# Patient Record
Sex: Female | Born: 1945 | Race: White | Hispanic: No | Marital: Married | Smoking: Former smoker
Health system: Southern US, Community
[De-identification: ages and names within clinical notes are randomized; demographics above are authoritative.]

## PROBLEM LIST (undated history)

## (undated) DIAGNOSIS — T7840XA Allergy, unspecified, initial encounter: Secondary | ICD-10-CM

## (undated) DIAGNOSIS — F32A Depression, unspecified: Secondary | ICD-10-CM

## (undated) DIAGNOSIS — H269 Unspecified cataract: Secondary | ICD-10-CM

## (undated) DIAGNOSIS — G4700 Insomnia, unspecified: Secondary | ICD-10-CM

## (undated) DIAGNOSIS — F419 Anxiety disorder, unspecified: Secondary | ICD-10-CM

## (undated) DIAGNOSIS — D649 Anemia, unspecified: Secondary | ICD-10-CM

## (undated) DIAGNOSIS — F319 Bipolar disorder, unspecified: Secondary | ICD-10-CM

## (undated) DIAGNOSIS — E785 Hyperlipidemia, unspecified: Secondary | ICD-10-CM

## (undated) DIAGNOSIS — M1990 Unspecified osteoarthritis, unspecified site: Secondary | ICD-10-CM

## (undated) DIAGNOSIS — C189 Malignant neoplasm of colon, unspecified: Secondary | ICD-10-CM

## (undated) DIAGNOSIS — I10 Essential (primary) hypertension: Secondary | ICD-10-CM

## (undated) HISTORY — PX: COLONOSCOPY: SHX174

## (undated) HISTORY — DX: Hyperlipidemia, unspecified: E78.5

## (undated) HISTORY — DX: Unspecified cataract: H26.9

## (undated) HISTORY — PX: CATARACT EXTRACTION W/ INTRAOCULAR LENS IMPLANT: SHX1309

## (undated) HISTORY — DX: Anxiety disorder, unspecified: F41.9

## (undated) HISTORY — DX: Anemia, unspecified: D64.9

## (undated) HISTORY — PX: REFRACTIVE SURGERY: SHX103

## (undated) HISTORY — PX: DILATION AND CURETTAGE OF UTERUS: SHX78

## (undated) HISTORY — DX: Unspecified osteoarthritis, unspecified site: M19.90

## (undated) HISTORY — PX: TONSILLECTOMY: SUR1361

---

## 1983-03-22 HISTORY — PX: ABDOMINAL HYSTERECTOMY: SHX81

## 1997-03-21 HISTORY — PX: COSMETIC SURGERY: SHX468

## 2000-04-24 ENCOUNTER — Other Ambulatory Visit: Admission: RE | Admit: 2000-04-24 | Discharge: 2000-04-24 | Payer: Self-pay | Admitting: *Deleted

## 2011-05-11 DIAGNOSIS — J019 Acute sinusitis, unspecified: Secondary | ICD-10-CM | POA: Diagnosis not present

## 2011-05-11 DIAGNOSIS — R030 Elevated blood-pressure reading, without diagnosis of hypertension: Secondary | ICD-10-CM | POA: Diagnosis not present

## 2011-05-11 DIAGNOSIS — J209 Acute bronchitis, unspecified: Secondary | ICD-10-CM | POA: Diagnosis not present

## 2011-11-15 DIAGNOSIS — J209 Acute bronchitis, unspecified: Secondary | ICD-10-CM | POA: Diagnosis not present

## 2011-11-15 DIAGNOSIS — H6690 Otitis media, unspecified, unspecified ear: Secondary | ICD-10-CM | POA: Diagnosis not present

## 2012-09-03 ENCOUNTER — Ambulatory Visit (INDEPENDENT_AMBULATORY_CARE_PROVIDER_SITE_OTHER): Payer: Medicare Other | Admitting: Family Medicine

## 2012-09-03 VITALS — BP 138/78 | HR 75 | Temp 97.9°F | Resp 18 | Ht 65.5 in | Wt 119.6 lb

## 2012-09-03 DIAGNOSIS — F411 Generalized anxiety disorder: Secondary | ICD-10-CM

## 2012-09-03 DIAGNOSIS — F32A Depression, unspecified: Secondary | ICD-10-CM

## 2012-09-03 DIAGNOSIS — F329 Major depressive disorder, single episode, unspecified: Secondary | ICD-10-CM

## 2012-09-03 DIAGNOSIS — R300 Dysuria: Secondary | ICD-10-CM

## 2012-09-03 DIAGNOSIS — F3289 Other specified depressive episodes: Secondary | ICD-10-CM | POA: Diagnosis not present

## 2012-09-03 LAB — POCT UA - MICROSCOPIC ONLY
Bacteria, U Microscopic: NEGATIVE
Casts, Ur, LPF, POC: NEGATIVE
Crystals, Ur, HPF, POC: NEGATIVE
Epithelial cells, urine per micros: NEGATIVE
Mucus, UA: NEGATIVE
Yeast, UA: NEGATIVE

## 2012-09-03 LAB — POCT CBC
Granulocyte percent: 66.4 %G (ref 37–80)
HCT, POC: 48.2 % — AB (ref 37.7–47.9)
Hemoglobin: 15.4 g/dL (ref 12.2–16.2)
Lymph, poc: 1.4 (ref 0.6–3.4)
MCH, POC: 30.8 pg (ref 27–31.2)
MCHC: 32 g/dL (ref 31.8–35.4)
MCV: 96.4 fL (ref 80–97)
MID (cbc): 0.3 (ref 0–0.9)
MPV: 8.4 fL (ref 0–99.8)
POC Granulocyte: 3.4 (ref 2–6.9)
POC LYMPH PERCENT: 26.9 % (ref 10–50)
POC MID %: 6.7 %M (ref 0–12)
Platelet Count, POC: 259 10*3/uL (ref 142–424)
RBC: 5 M/uL (ref 4.04–5.48)
RDW, POC: 13.7 %
WBC: 5.1 10*3/uL (ref 4.6–10.2)

## 2012-09-03 LAB — POCT URINALYSIS DIPSTICK
Bilirubin, UA: NEGATIVE
Blood, UA: NEGATIVE
Glucose, UA: NEGATIVE
Ketones, UA: 80
Leukocytes, UA: NEGATIVE
Nitrite, UA: NEGATIVE
Protein, UA: NEGATIVE
Spec Grav, UA: 1.015
Urobilinogen, UA: 0.2
pH, UA: 5.5

## 2012-09-03 LAB — COMPREHENSIVE METABOLIC PANEL WITH GFR
Albumin: 4.7 g/dL (ref 3.5–5.2)
Alkaline Phosphatase: 50 U/L (ref 39–117)
BUN: 14 mg/dL (ref 6–23)
CO2: 30 meq/L (ref 19–32)
Glucose, Bld: 92 mg/dL (ref 70–99)
Potassium: 4.5 meq/L (ref 3.5–5.3)
Total Bilirubin: 0.9 mg/dL (ref 0.3–1.2)

## 2012-09-03 LAB — POCT WET PREP WITH KOH
Clue Cells Wet Prep HPF POC: NEGATIVE
KOH Prep POC: NEGATIVE
Trichomonas, UA: NEGATIVE
Yeast Wet Prep HPF POC: NEGATIVE

## 2012-09-03 LAB — COMPREHENSIVE METABOLIC PANEL
ALT: 15 U/L (ref 0–35)
AST: 21 U/L (ref 0–37)
Calcium: 9.9 mg/dL (ref 8.4–10.5)
Chloride: 102 mEq/L (ref 96–112)
Creat: 0.68 mg/dL (ref 0.50–1.10)
Sodium: 141 mEq/L (ref 135–145)
Total Protein: 6.9 g/dL (ref 6.0–8.3)

## 2012-09-03 LAB — TSH: TSH: 0.521 u[IU]/mL (ref 0.350–4.500)

## 2012-09-03 MED ORDER — CLONAZEPAM 0.5 MG PO TABS: 0.5000 mg | ORAL_TABLET | Freq: Two times a day (BID) | ORAL | Status: DC | PRN

## 2012-09-03 MED ORDER — SERTRALINE HCL 50 MG PO TABS
50.0000 mg | ORAL_TABLET | Freq: Every day | ORAL | Status: DC
Start: 2012-09-03 — End: 2012-09-27

## 2012-09-03 MED ORDER — CLONAZEPAM 0.25 MG PO TBDP: 0.2500 mg | ORAL_TABLET | Freq: Two times a day (BID) | ORAL | Status: DC | PRN

## 2012-09-03 NOTE — Progress Notes (Signed)
[facility]:  Office Visit  Chief Complaint:  Chief Complaint  Patient presents with  . Depression    on and off for several years   . Insomnia    HPI: Vickie Holloway is a 67 y.o. female who complains of depression. Is being sen by therapist wh she has been to for 3-4 sessions.Feels  Helpless, hopeless, feels horrible. Poor appetiti, not motivated to do anything because of anxiety. Hard to do anything. She has had depression off and on most of her life. Was on medicine 25 years ago but did not take it very long. Does not remember name. She is not one to take meds. She is afraid of death, she has a son who is a quadriplegic, he lives on his own. She is married and has a suppottive husband. No guns in the house She is not suicidal/homicidal.She has generalized anxiety about everything, all the mistakes she's made or about to make, about her life . She is afraid of doctors, she is afriad of finding out if she has medical problems. She flushes all over her skin and gets rashes on her chest when she is anxious.  She sees her therapist Lynnette Caffey EdD in [place] 3 x, she used to [employer] but his wait time was inJuly Mother had depression, mom was ? On Prozac Occassional alcohol drinker, 2 glasses per week She takes her husband's ambien to help her sleep She has had [place] incontinence with dysuria, she put some vasoline on her vaginal area, urinary frequency, no itching, no dc, no fevers, chills, n/v/abd pan/pelvic pain. She has not had sex in over 1 year due to vaginal dryness.    Past Medical History  Diagnosis Date  . Arthritis   . Anxiety    Past Surgical History  Procedure Laterality Date  . Cosmetic surgery     History   Social History  . Marital Status: Married    Spouse Name: N/A    Number of Children: N/A  . Years of Education: N/A   Social History Main Topics  . Smoking status: Never Smoker   . Smokeless tobacco: None  . Alcohol Use: Yes   . Drug Use: No  . Sexually Active: None   Other Topics Concern  . None   Social History Narrative  . None   Family History  Problem Relation Age of Onset  . Stroke Mother    No Known Allergies Prior to Admission medications   Not on File     ROS: The patient denies fevers, chills, night sweats, unintentional weight loss, chest pain, palpitations, wheezing, dyspnea on exertion, nausea, vomiting, abdominal pain, dysuria, hematuria, melena, numbness, weakness, or tingling.  All other systems have been reviewed and were otherwise negative with the exception of those mentioned in the HPI and as above.    PHYSICAL EXAM: Filed Vitals:   09/03/12 1440  BP: 138/78  Pulse: 75  Temp: 97.9 F (36.6 C)  Resp: 18   Filed Vitals:   09/03/12 1440  Height: 5' 5.5" (1.664 m)  Weight: 119 lb 9.6 oz (54.25 kg)   Body mass index is 19.59 kg/(m^2).  General: Alert, tearful, moderate distress and anxiety HEENT:  Normocephalic, atraumatic, oropharynx patent. No thyroidmegaly Cardiovascular:  Regular rate and rhythm, no rubs murmurs or gallops.  No Carotid bruits, radial pulse intact. No pedal edema.  Respiratory: Clear to auscultation bilaterally.  No wheezes, rales, or rhonchi.  No cyanosis, no use of accessory  musculature GI: No organomegaly, abdomen is soft and non-tender, positive bowel sounds.  No masses. Skin: No rashes. Neurologic: Facial musculature symmetric. Psychiatric: Patient is appropriate throughout our interaction considering her anxiety and depression, Denies SI/HI/hallucinations or mania. Lymphatic: No cervical lymphadenopathy Musculoskeletal: Gait intact.   LABS: Results for orders placed in visit on 09/03/12  POCT CBC      Result Value Range   WBC 5.1  4.6 - 10.2 K/uL   Lymph, poc 1.4  0.6 - 3.4   POC LYMPH PERCENT 26.9  10 - 50 %L   MID (cbc) 0.3  0 - 0.9   POC MID % 6.7  0 - 12 %M   POC Granulocyte 3.4  2 - 6.9   Granulocyte percent 66.4  37 - 80 %G   RBC  5.00  4.04 - 5.48 M/uL   Hemoglobin 15.4  12.2 - 16.2 g/dL   HCT, POC 78.2 (*) 95.6 - 47.9 %   MCV 96.4  80 - 97 fL   MCH, POC 30.8  27 - 31.2 pg   MCHC 32.0  31.8 - 35.4 g/dL   RDW, POC 21.3     Platelet Count, POC 259  142 - 424 K/uL   MPV 8.4  0 - 99.8 fL  POCT UA - MICROSCOPIC ONLY      Result Value Range   WBC, Ur, HPF, POC 2-6     RBC, urine, microscopic 4-5     Bacteria, U Microscopic neg     Mucus, UA neg     Epithelial cells, urine per micros neg     Crystals, Ur, HPF, POC neg     Casts, Ur, LPF, POC neg     Yeast, UA neg    POCT URINALYSIS DIPSTICK      Result Value Range   Color, UA yellow     Clarity, UA clear     Glucose, UA neg     Bilirubin, UA neg     Ketones, UA 80     Spec Grav, UA 1.015     Blood, UA neg     pH, UA 5.5     Protein, UA neg     Urobilinogen, UA 0.2     Nitrite, UA neg     Leukocytes, UA Negative    POCT WET PREP WITH KOH      Result Value Range   Trichomonas, UA Negative     Clue Cells Wet Prep HPF POC neg     Epithelial Wet Prep HPF POC 1-2     Yeast Wet Prep HPF POC neg     Bacteria Wet Prep HPF POC 1+     RBC Wet Prep HPF POC 1-4     WBC Wet Prep HPF POC 0-3     KOH Prep POC Negative       EKG/XRAY:   Primary read interpreted by Dr. Conley Rolls at [facility].   ASSESSMENT/PLAN: Encounter Diagnoses  Name Primary?  . Generalized anxiety disorder Yes  . Depression   . Dysuria    She does not have mania sxs. She has significant anxiety and depressionw hich she has been abttling on her own and now wants help. She sees a tehrapist, Lynnette Caffey, she has an appt with psychiatry on Wedensday at [facility] but needs something to tie her over until then since her sxs are getting worse per paitient and therapist.  She adamently denies to me SI/HI/hallucinations Rx Zoloft 50 mg daily Rx  Klonopin 0.25 mg BID prn F/u in 1 week if she misses her psych appt or prn I will f/u by phone tomorrow  Take Azo otc for bladder frequency/spasms for now. Go  to Er prn for worsening sxs or suicidal ideation/thoughts   LE, THAO PHUONG, DO 09/03/2012 5:27 PM

## 2012-09-04 ENCOUNTER — Telehealth: Payer: Self-pay

## 2012-09-04 ENCOUNTER — Telehealth: Payer: Self-pay | Admitting: Family Medicine

## 2012-09-04 NOTE — Telephone Encounter (Signed)
Pt returned dr. Conley Rolls phone call and would like to be called at [phone]

## 2012-09-04 NOTE — Telephone Encounter (Signed)
Spoke to patient re labs. She is doing ok She is less tense, her vaginal issues have resolved. She slept ok from 3-5  But did not let the tab disintergrate, she just swallowed it.

## 2012-09-04 NOTE — Telephone Encounter (Signed)
LM on machine both mobile and home phone to see how she is doing.

## 2012-09-05 ENCOUNTER — Ambulatory Visit (HOSPITAL_COMMUNITY): Admission: RE | Admit: 2012-09-05 | Payer: Medicare Other | Source: Home / Self Care | Admitting: Psychiatry

## 2012-09-06 ENCOUNTER — Telehealth: Payer: Self-pay

## 2012-09-06 NOTE — Telephone Encounter (Signed)
Patient has a question for Dr. Conley Rolls about a medication. Did not go into detail. [phone]

## 2012-09-06 NOTE — Telephone Encounter (Signed)
LM for patient to call to back with questions

## 2012-09-06 NOTE — Telephone Encounter (Signed)
Called patient she is better the medications are helping patient is worried because she only has 1 month supply of her medications. Patient advised when this gets low she is to send in a request for refill. She will do this.

## 2012-09-27 ENCOUNTER — Other Ambulatory Visit: Payer: Self-pay | Admitting: Family Medicine

## 2012-09-27 DIAGNOSIS — F411 Generalized anxiety disorder: Secondary | ICD-10-CM

## 2012-09-27 DIAGNOSIS — F32A Depression, unspecified: Secondary | ICD-10-CM

## 2012-09-27 DIAGNOSIS — F329 Major depressive disorder, single episode, unspecified: Secondary | ICD-10-CM

## 2012-09-27 MED ORDER — SERTRALINE HCL 50 MG PO TABS: 50.0000 mg | ORAL_TABLET | Freq: Every day | ORAL | Status: DC

## 2012-09-27 MED ORDER — CLONAZEPAM 0.25 MG PO TBDP: ORAL_TABLET | ORAL | Status: DC

## 2012-09-27 NOTE — Progress Notes (Signed)
Patient doing better onmeds, needs more help with sleep.

## 2012-09-30 ENCOUNTER — Other Ambulatory Visit: Payer: Self-pay | Admitting: Family Medicine

## 2012-10-27 DIAGNOSIS — H9209 Otalgia, unspecified ear: Secondary | ICD-10-CM | POA: Diagnosis not present

## 2012-10-27 DIAGNOSIS — J309 Allergic rhinitis, unspecified: Secondary | ICD-10-CM | POA: Diagnosis not present

## 2012-12-28 DIAGNOSIS — M7989 Other specified soft tissue disorders: Secondary | ICD-10-CM | POA: Diagnosis not present

## 2012-12-28 DIAGNOSIS — M19049 Primary osteoarthritis, unspecified hand: Secondary | ICD-10-CM | POA: Diagnosis not present

## 2012-12-28 DIAGNOSIS — M25539 Pain in unspecified wrist: Secondary | ICD-10-CM | POA: Diagnosis not present

## 2012-12-28 DIAGNOSIS — S52539A Colles' fracture of unspecified radius, initial encounter for closed fracture: Secondary | ICD-10-CM | POA: Diagnosis not present

## 2012-12-28 DIAGNOSIS — S52599A Other fractures of lower end of unspecified radius, initial encounter for closed fracture: Secondary | ICD-10-CM | POA: Diagnosis not present

## 2012-12-31 ENCOUNTER — Other Ambulatory Visit: Payer: Self-pay | Admitting: Orthopedic Surgery

## 2012-12-31 ENCOUNTER — Encounter (HOSPITAL_BASED_OUTPATIENT_CLINIC_OR_DEPARTMENT_OTHER): Payer: Self-pay | Admitting: *Deleted

## 2012-12-31 DIAGNOSIS — S52599A Other fractures of lower end of unspecified radius, initial encounter for closed fracture: Secondary | ICD-10-CM | POA: Diagnosis not present

## 2012-12-31 NOTE — Progress Notes (Signed)
No labs needed

## 2012-12-31 NOTE — H&P (Signed)
  Vickie Holloway is an 67 y.o. female.   Chief Complaint: c/o left distal radius fracture after a fall [place] the beach HPI: Patient is a 67 y/o female who sustained an injury to her left wrist after [place] on 12[place]/12. No LOC/CP or syncope either prior to or after the fall. She was taken to [facility] for evaluation and treatment. X-rays revealed a "clean break" fracture of the left distal radius. She was placed in a wrist/forearm splint and referred for follow-up.   Past Medical History  Diagnosis Date  . Arthritis   . Anxiety     Past Surgical History  Procedure Laterality Date  . Cosmetic surgery      Family History  Problem Relation Age of Onset  . Stroke Mother    Social History:  reports that she has never smoked. She does not have any smokeless tobacco history on file. She reports that she drinks alcohol. She reports that she does not use illicit drugs.  Allergies: No Known Allergies  No prescriptions prior to admission    No results found for this or any previous visit (from the past 48 hour(s)).  No results found.   Pertinent items are noted in HPI.  There were no vitals taken for this visit.  General appearance: alert Head: Normocephalic, without obvious abnormality Neck: supple, symmetrical, trachea midline Resp: clear to auscultation bilaterally Cardio: regular rate and rhythm GI: normal findings: bowel sounds normal Extremities:Exam of her left wrist reveals 2+ swelling of her digits. Her splint/Ace wrap is too tight. This is removed and the skin is inspected and reveals no areas of breakdown or abrasion. N/V is intact distally with about 50% of her normal ROM of her digits. Xrays are reviewed and reveal slightly dorsally angulated distal radius fracture and small ulna styloid fracture Pulses: 2+ and symmetric Skin: normal Neurologic: WNL  Assessment/Plan  Impression: Displaced left distal radius fracture with loss of  radial length, slope and >35 degrees loss of volar tilt.  Plan: To the OR for ORIF left distal radius fracture.The procedure, risks,benefits and post-op course were discussed with the patient at length and they were in agreement with the plan.           DASNOIT,Vickie Holloway 12/31/2012, 3:30 PM

## 2013-01-01 ENCOUNTER — Ambulatory Visit (HOSPITAL_BASED_OUTPATIENT_CLINIC_OR_DEPARTMENT_OTHER)
Admission: RE | Admit: 2013-01-01 | Discharge: 2013-01-01 | Disposition: A | Discharge status: Home / Self Care | Payer: Medicare Other | Source: Ambulatory Visit | Attending: Orthopedic Surgery | Admitting: Orthopedic Surgery

## 2013-01-01 ENCOUNTER — Encounter (HOSPITAL_BASED_OUTPATIENT_CLINIC_OR_DEPARTMENT_OTHER): Payer: Self-pay | Admitting: Orthopedic Surgery

## 2013-01-01 ENCOUNTER — Ambulatory Visit (HOSPITAL_BASED_OUTPATIENT_CLINIC_OR_DEPARTMENT_OTHER): Payer: Medicare Other | Admitting: Anesthesiology

## 2013-01-01 ENCOUNTER — Encounter (HOSPITAL_BASED_OUTPATIENT_CLINIC_OR_DEPARTMENT_OTHER): Payer: Medicare Other | Admitting: Anesthesiology

## 2013-01-01 ENCOUNTER — Encounter (HOSPITAL_BASED_OUTPATIENT_CLINIC_OR_DEPARTMENT_OTHER)
Admission: RE | Disposition: A | Discharge status: Home / Self Care | Payer: Self-pay | Source: Ambulatory Visit | Attending: Orthopedic Surgery

## 2013-01-01 DIAGNOSIS — F411 Generalized anxiety disorder: Secondary | ICD-10-CM | POA: Insufficient documentation

## 2013-01-01 DIAGNOSIS — M129 Arthropathy, unspecified: Secondary | ICD-10-CM | POA: Diagnosis not present

## 2013-01-01 DIAGNOSIS — W19XXXA Unspecified fall, initial encounter: Secondary | ICD-10-CM | POA: Insufficient documentation

## 2013-01-01 DIAGNOSIS — G8918 Other acute postprocedural pain: Secondary | ICD-10-CM | POA: Diagnosis not present

## 2013-01-01 DIAGNOSIS — S52599A Other fractures of lower end of unspecified radius, initial encounter for closed fracture: Secondary | ICD-10-CM | POA: Diagnosis not present

## 2013-01-01 DIAGNOSIS — Y92009 Unspecified place in unspecified non-institutional (private) residence as the place of occurrence of the external cause: Secondary | ICD-10-CM | POA: Insufficient documentation

## 2013-01-01 HISTORY — PX: ORIF WRIST FRACTURE: SHX2133

## 2013-01-01 LAB — POCT HEMOGLOBIN-HEMACUE: Hemoglobin: 13.6 g/dL (ref 12.0–15.0)

## 2013-01-01 SURGERY — OPEN REDUCTION INTERNAL FIXATION (ORIF) WRIST FRACTURE
Anesthesia: General | Site: Wrist | Laterality: Left | Wound class: Clean

## 2013-01-01 MED ORDER — PROPOFOL 10 MG/ML IV BOLUS
INTRAVENOUS | Status: DC | PRN
  Administered 2013-01-01: 150 mg via INTRAVENOUS

## 2013-01-01 MED ORDER — FENTANYL CITRATE 0.05 MG/ML IJ SOLN
INTRAMUSCULAR | Status: AC
  Filled 2013-01-01: qty 2

## 2013-01-01 MED ORDER — LIDOCAINE HCL (CARDIAC) 20 MG/ML IV SOLN
INTRAVENOUS | Status: DC | PRN
  Administered 2013-01-01: 60 mg via INTRAVENOUS

## 2013-01-01 MED ORDER — HYDROMORPHONE HCL PF 1 MG/ML IJ SOLN: 0.2500 mg | INTRAMUSCULAR | Status: DC | PRN

## 2013-01-01 MED ORDER — OXYCODONE HCL 5 MG PO TABS: 5.0000 mg | ORAL_TABLET | Freq: Once | ORAL | Status: DC | PRN

## 2013-01-01 MED ORDER — MIDAZOLAM HCL 2 MG/2ML IJ SOLN
0.5000 mg | INTRAMUSCULAR | Status: DC | PRN
  Administered 2013-01-01: 2 mg via INTRAVENOUS

## 2013-01-01 MED ORDER — LACTATED RINGERS IV SOLN
INTRAVENOUS | Status: DC
  Administered 2013-01-01 (×2): via INTRAVENOUS

## 2013-01-01 MED ORDER — ONDANSETRON HCL 4 MG/2ML IJ SOLN: 4.0000 mg | Freq: Once | INTRAMUSCULAR | Status: DC | PRN

## 2013-01-01 MED ORDER — MEPERIDINE HCL 25 MG/ML IJ SOLN: 6.2500 mg | INTRAMUSCULAR | Status: DC | PRN

## 2013-01-01 MED ORDER — ROPIVACAINE HCL 5 MG/ML IJ SOLN
INTRAMUSCULAR | Status: DC | PRN
  Administered 2013-01-01: 30 mL

## 2013-01-01 MED ORDER — EPHEDRINE SULFATE 50 MG/ML IJ SOLN
INTRAMUSCULAR | Status: DC | PRN
  Administered 2013-01-01: 10 mg via INTRAVENOUS

## 2013-01-01 MED ORDER — CHLORHEXIDINE GLUCONATE 4 % EX LIQD: 60.0000 mL | Freq: Once | CUTANEOUS | Status: DC

## 2013-01-01 MED ORDER — DEXAMETHASONE SODIUM PHOSPHATE 10 MG/ML IJ SOLN
INTRAMUSCULAR | Status: DC | PRN
  Administered 2013-01-01: 10 mg via INTRAVENOUS

## 2013-01-01 MED ORDER — OXYCODONE HCL 5 MG/5ML PO SOLN: 5.0000 mg | Freq: Once | ORAL | Status: DC | PRN

## 2013-01-01 MED ORDER — CEFAZOLIN SODIUM-DEXTROSE 2-3 GM-% IV SOLR
INTRAVENOUS | Status: AC
  Filled 2013-01-01: qty 50

## 2013-01-01 MED ORDER — MIDAZOLAM HCL 2 MG/2ML IJ SOLN
INTRAMUSCULAR | Status: AC
  Filled 2013-01-01: qty 2

## 2013-01-01 MED ORDER — DEXAMETHASONE SODIUM PHOSPHATE 4 MG/ML IJ SOLN
INTRAMUSCULAR | Status: DC | PRN
  Administered 2013-01-01: 4 mg

## 2013-01-01 MED ORDER — FENTANYL CITRATE 0.05 MG/ML IJ SOLN
50.0000 ug | INTRAMUSCULAR | Status: DC | PRN
  Administered 2013-01-01: 100 ug via INTRAVENOUS

## 2013-01-01 MED ORDER — ONDANSETRON HCL 4 MG/2ML IJ SOLN
INTRAMUSCULAR | Status: DC | PRN
  Administered 2013-01-01: 4 mg via INTRAMUSCULAR

## 2013-01-01 SURGICAL SUPPLY — 70 items
BAG DECANTER FOR FLEXI CONT (MISCELLANEOUS) IMPLANT
BANDAGE ADHESIVE 1X3 (GAUZE/BANDAGES/DRESSINGS) IMPLANT
BANDAGE ELASTIC 3 VELCRO ST LF (GAUZE/BANDAGES/DRESSINGS) ×4 IMPLANT
BANDAGE GAUZE ELAST BULKY 4 IN (GAUZE/BANDAGES/DRESSINGS) ×2 IMPLANT
BIT DRILL 2 FAST STEP (BIT) ×2 IMPLANT
BIT DRILL 2.5X4 QC (BIT) ×2 IMPLANT
BLADE MINI RND TIP GREEN BEAV (BLADE) IMPLANT
BLADE SURG 15 STRL LF DISP TIS (BLADE) ×2 IMPLANT
BLADE SURG 15 STRL SS (BLADE) ×2
BNDG COHESIVE 3X5 TAN STRL LF (GAUZE/BANDAGES/DRESSINGS) IMPLANT
BNDG ESMARK 4X9 LF (GAUZE/BANDAGES/DRESSINGS) ×2 IMPLANT
BRUSH SCRUB EZ PLAIN DRY (MISCELLANEOUS) ×2 IMPLANT
CANISTER SUCT 1200ML W/VALVE (MISCELLANEOUS) ×2 IMPLANT
CLOTH BEACON ORANGE TIMEOUT ST (SAFETY) IMPLANT
CORDS BIPOLAR (ELECTRODE) ×2 IMPLANT
COVER MAYO STAND STRL (DRAPES) ×2 IMPLANT
COVER TABLE BACK 60X90 (DRAPES) ×2 IMPLANT
CUFF TOURNIQUET SINGLE 18IN (TOURNIQUET CUFF) ×2 IMPLANT
DECANTER SPIKE VIAL GLASS SM (MISCELLANEOUS) IMPLANT
DRAPE EXTREMITY T 121X128X90 (DRAPE) ×2 IMPLANT
DRAPE OEC MINIVIEW 54X84 (DRAPES) ×2 IMPLANT
DRAPE SURG 17X23 STRL (DRAPES) ×2 IMPLANT
GAUZE XEROFORM 1X8 LF (GAUZE/BANDAGES/DRESSINGS) IMPLANT
GLOVE BIOGEL M STRL SZ7.5 (GLOVE) ×2 IMPLANT
GLOVE BIOGEL PI IND STRL 7.0 (GLOVE) ×2 IMPLANT
GLOVE BIOGEL PI INDICATOR 7.0 (GLOVE) ×2
GLOVE ECLIPSE 6.5 STRL STRAW (GLOVE) ×2 IMPLANT
GLOVE ORTHO TXT STRL SZ7.5 (GLOVE) ×2 IMPLANT
GOWN BRE IMP PREV XXLGXLNG (GOWN DISPOSABLE) ×4 IMPLANT
GOWN PREVENTION PLUS XLARGE (GOWN DISPOSABLE) ×4 IMPLANT
K-WIRE .062X4 (WIRE) ×2 IMPLANT
LOOP VESSEL MAXI BLUE (MISCELLANEOUS) IMPLANT
NEEDLE 27GAX1X1/2 (NEEDLE) IMPLANT
NS IRRIG 1000ML POUR BTL (IV SOLUTION) ×2 IMPLANT
PACK BASIN DAY SURGERY FS (CUSTOM PROCEDURE TRAY) ×2 IMPLANT
PAD CAST 3X4 CTTN HI CHSV (CAST SUPPLIES) ×2 IMPLANT
PADDING CAST ABS 4INX4YD NS (CAST SUPPLIES) ×1
PADDING CAST ABS COTTON 4X4 ST (CAST SUPPLIES) ×1 IMPLANT
PADDING CAST COTTON 3X4 STRL (CAST SUPPLIES) ×2
PEG SUBCHONDRAL SMOOTH 2.0X18 (Peg) ×4 IMPLANT
PEG SUBCHONDRAL SMOOTH 2.0X20 (Peg) ×4 IMPLANT
PLATE SHORT 21.6X48.9 NRRW LT (Plate) ×2 IMPLANT
SCREW BN 12X3.5XNS CORT TI (Screw) ×1 IMPLANT
SCREW CORT 3.5X10 LNG (Screw) ×4 IMPLANT
SCREW CORT 3.5X12 (Screw) ×1 IMPLANT
SCREW PEG LOCK 2.5X16 (Peg) ×2 IMPLANT
SCREW PEG LOCK 2.5X18 (Peg) ×2 IMPLANT
SLEEVE SCD COMPRESS KNEE MED (MISCELLANEOUS) ×2 IMPLANT
SLING ARM FOAM STRAP MED (SOFTGOODS) ×2 IMPLANT
SPLINT PLASTER CAST XFAST 3X15 (CAST SUPPLIES) ×21 IMPLANT
SPLINT PLASTER XTRA FASTSET 3X (CAST SUPPLIES) ×21
SPONGE GAUZE 4X4 12PLY (GAUZE/BANDAGES/DRESSINGS) IMPLANT
STOCKINETTE 4X48 STRL (DRAPES) ×2 IMPLANT
STRIP CLOSURE SKIN 1/2X4 (GAUZE/BANDAGES/DRESSINGS) ×2 IMPLANT
SUCTION FRAZIER TIP 10 FR DISP (SUCTIONS) IMPLANT
SUT ETHIBOND 3-0 V-5 (SUTURE) IMPLANT
SUT PROLENE 3 0 PS 2 (SUTURE) ×2 IMPLANT
SUT VIC AB 0 SH 27 (SUTURE) ×2 IMPLANT
SUT VIC AB 2-0 PS2 27 (SUTURE) IMPLANT
SUT VIC AB 3-0 FS2 27 (SUTURE) IMPLANT
SUT VIC AB 4-0 BRD 54 (SUTURE) IMPLANT
SUT VIC AB 4-0 P-3 18XBRD (SUTURE) ×1 IMPLANT
SUT VIC AB 4-0 P3 18 (SUTURE) ×1
SYR 3ML 23GX1 SAFETY (SYRINGE) IMPLANT
SYR BULB 3OZ (MISCELLANEOUS) ×2 IMPLANT
SYR CONTROL 10ML LL (SYRINGE) IMPLANT
TOWEL OR 17X24 6PK STRL BLUE (TOWEL DISPOSABLE) ×4 IMPLANT
TRAY DSU PREP LF (CUSTOM PROCEDURE TRAY) ×2 IMPLANT
TUBE CONNECTING 20X1/4 (TUBING) ×2 IMPLANT
UNDERPAD 30X30 INCONTINENT (UNDERPADS AND DIAPERS) ×2 IMPLANT

## 2013-01-01 NOTE — Brief Op Note (Signed)
01/01/2013  10:38 AM  PATIENT:  Terrilyn Saver Pecha  67 y.o. female  PRE-OPERATIVE DIAGNOSIS:  LEFT WRIST DISTAL RADIUS FRACTURE  POST-OPERATIVE DIAGNOSIS:  LEFT WRIST DISTAL RADIUS FRACTURE  PROCEDURE:  Procedure(s): OPEN REDUCTION INTERNAL FIXATION (ORIF)OF LEFT DISTAL RADIUS FRACTURE (Left)  SURGEON:  Surgeon(s) and Role:    * Wyn Forster., MD - Primary  PHYSICIAN ASSISTANT:   ASSISTANTS: Mallory Shirk.A-C    ANESTHESIA:   general  EBL:  Total I/O In: 1400 [I.V.:1400] Out: -   BLOOD ADMINISTERED:none  DRAINS: none   LOCAL MEDICATIONS USED:  Ropivacaine supraclavicular block  SPECIMEN:  No Specimen  DISPOSITION OF SPECIMEN:  N/A  COUNTS:  YES  TOURNIQUET:   Total Tourniquet Time Documented: Upper Arm (Left) - 52 minutes Total: Upper Arm (Left) - 52 minutes   DICTATION: .Other Dictation: Dictation Number [id number]  PLAN OF CARE: Discharge to home after PACU  PATIENT DISPOSITION:  PACU - hemodynamically stable.   Delay start of Pharmacological VTE agent (>24hrs) due to surgical blood loss or risk of bleeding: not applicable

## 2013-01-01 NOTE — Anesthesia Postprocedure Evaluation (Signed)
Anesthesia Post Note  Patient: Vickie Holloway  Procedure(s) Performed: Procedure(s) (LRB): OPEN REDUCTION INTERNAL FIXATION (ORIF)OF LEFT DISTAL RADIUS FRACTURE (Left)  Anesthesia type: general  Patient location: PACU  Post pain: Pain level controlled  Post assessment: Patient's Cardiovascular Status Stable  Last Vitals:  Filed Vitals:   01/01/13 1156  BP: 140/74  Pulse: 62  Temp: 36.5 C  Resp: 16    Post vital signs: Reviewed and stable  Level of consciousness: sedated  Complications: No apparent anesthesia complications

## 2013-01-01 NOTE — Op Note (Signed)
[id number]  

## 2013-01-01 NOTE — Anesthesia Preprocedure Evaluation (Signed)
Anesthesia Evaluation  Patient identified by MRN, date of birth, ID band Patient awake    Reviewed: Allergy & Precautions, H&P , NPO status , Patient's Chart, lab work & pertinent test results  Airway Mallampati: I TM Distance: >3 FB Neck ROM: Full    Dental   Pulmonary          Cardiovascular     Neuro/Psych Anxiety Depression    GI/Hepatic   Endo/Other    Renal/GU      Musculoskeletal   Abdominal   Peds  Hematology   Anesthesia Other Findings   Reproductive/Obstetrics                           Anesthesia Physical Anesthesia Plan  ASA: II  Anesthesia Plan: General   Post-op Pain Management:    Induction: Intravenous  Airway Management Planned: LMA  Additional Equipment:   Intra-op Plan:   Post-operative Plan: Extubation in OR  Informed Consent: I have reviewed the patients History and Physical, chart, labs and discussed the procedure including the risks, benefits and alternatives for the proposed anesthesia with the patient or authorized representative who has indicated his/her understanding and acceptance.     Plan Discussed with: CRNA and Surgeon  Anesthesia Plan Comments:         Anesthesia Quick Evaluation  

## 2013-01-01 NOTE — Anesthesia Procedure Notes (Addendum)
Anesthesia Regional Block:  Supraclavicular block  Pre-Anesthetic Checklist: ,, timeout performed, Correct Patient, Correct Site, Correct Laterality, Correct Procedure, Correct Position, site marked, Risks and benefits discussed,  Surgical consent,  Pre-op evaluation,  At surgeon's request and post-op pain management  Laterality: Left  Prep: chloraprep       Needles:   Needle Type: Echogenic Stimulator Needle     Needle Length: 5cm 5 cm Needle Gauge: 21 and 21 G    Additional Needles:  Procedures: ultrasound guided (picture in chart) and nerve stimulator Supraclavicular block  Nerve Stimulator or Paresthesia:  Response: 0.4 mA,   Additional Responses:   Narrative:  Start time: 01/01/2013 8:53 AM End time: 01/01/2013 9:07 AM Injection made incrementally with aspirations every 5 mL.  Performed by: Personally  Anesthesiologist: Arta Bruce MD  Additional Notes: Monitors applied. Patient sedated. Sterile prep and drape,hand hygiene and sterile gloves were used. Relevant anatomy identified.Needle position confirmed.Local anesthetic injected incrementally after negative aspiration. Local anesthetic spread visualized around nerve(s). Vascular puncture avoided. No complications. Image printed for medical record.The patient tolerated the procedure well.       Supraclavicular block Procedure Name: LMA Insertion Date/Time: 01/01/2013 9:33 AM Performed by: Caren Macadam Pre-anesthesia Checklist: Patient identified, Emergency Drugs available, Suction available and Patient being monitored Patient Re-evaluated:Patient Re-evaluated prior to inductionOxygen Delivery Method: Circle System Utilized Preoxygenation: Pre-oxygenation with 100% oxygen Intubation Type: IV induction Ventilation: Mask ventilation without difficulty LMA: LMA inserted LMA Size: 3.0 Number of attempts: 1 Airway Equipment and Method: bite block Placement Confirmation: positive ETCO2 and breath sounds  checked- equal and bilateral Tube secured with: Tape Dental Injury: Teeth and Oropharynx as per pre-operative assessment

## 2013-01-01 NOTE — Transfer of Care (Signed)
Immediate Anesthesia Transfer of Care Note  Patient: Vickie Holloway  Procedure(s) Performed: Procedure(s): OPEN REDUCTION INTERNAL FIXATION (ORIF)OF LEFT DISTAL RADIUS FRACTURE (Left)  Patient Location: PACU  Anesthesia Type:General and GA combined with regional for post-op pain  Level of Consciousness: awake and alert   Airway & Oxygen Therapy: Patient Spontanous Breathing and Patient connected to face mask oxygen  Post-op Assessment: Report given to PACU RN and Post -op Vital signs reviewed and stable  Post vital signs: Reviewed and stable  Complications: No apparent anesthesia complications

## 2013-01-01 NOTE — Progress Notes (Signed)
Assisted Dr. Ossey with left, ultrasound guided, supraclavicular block. Side rails up, monitors on throughout procedure. See vital signs in flow sheet. Tolerated Procedure well. 

## 2013-01-02 DIAGNOSIS — S52599A Other fractures of lower end of unspecified radius, initial encounter for closed fracture: Secondary | ICD-10-CM | POA: Diagnosis not present

## 2013-01-02 NOTE — Op Note (Signed)
Vickie Holloway NO.:  [id number]  MEDICAL RECORD NO.:  [id number]  LOCATION:                                 FACILITY:  PHYSICIAN:  Vickie Fitch. Illona Bulman, M.D. DATE OF BIRTH:  Nov 30, 1945  DATE OF PROCEDURE:  01/01/2013 DATE OF DISCHARGE:                              OPERATIVE REPORT   PREOPERATIVE DIAGNOSIS:  Significantly displaced impacted fracture of left dominant distal radius.  POSTOPERATIVE DIAGNOSIS:  Significantly displaced impacted fracture of left dominant distal radius.  OPERATION:  Open reduction internal fixation of left distal radius with application of a mini 6-pegged DVR plate system.  OPERATING SURGEON:  Vickie Fitch. Viggo Perko, MD  ASSISTANT:  Marveen Reeks Dasnoit, PA  ANESTHESIA:  General by LMA.  SUPERVISING ANESTHESIOLOGIST:  Kaylyn Layer. Michelle Piper, M.D.  Note, Dr. Michelle Piper placed a preoperative supraclavicular block with ultrasound guidance, leading to excellent anesthesia of the left upper extremity.  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  She was seen at [facility] in [place], [place], where x-rays revealed an impacted comminuted fracture of her left dominant distal radius.  She was advised by the ER staff, she had a "clean break and would be casted."  She is very familiar with our practice and sought an upper extremity Orthopedic consult.  Careful analysis of her x-rays revealed that she had lost radial length slope and had more than 35 degrees loss of volar tilt.  She was at risk to develop midcarpal instability and chronic malunion due to complete comminution of the dorsal cortex.  We advised her to undergo open reduction and internal fixation, implying a volar plate system.  We discussed possible use of freeze-dried cancellous graft.  After detailed informed consent, she was brought to the operating  room at this time.  Preoperatively, she was interviewed by Dr. Michelle Piper of Anesthesia.  Dr. Michelle Piper recommended a supraclavicular block.  This was placed with ultrasound guidance with ropivacaine leading to excellent anesthesia of the left upper extremity.  After informed consent, Vickie Holloway was brought to the operating room at this time.  DESCRIPTION OF PROCEDURE:  Vickie Holloway was brought to room 2 of the [facility] and placed supine position on the operating table.  Her left arm had been marked per the proper surgical site identification protocol with a marking pen preoperatively.  In room 2 under Dr. Deirdre Priest direct supervision, general anesthesia by LMA technique was induced followed by routine Betadine scrub and paint of the left upper extremity.  A pneumatic tourniquet was applied to the proximal left brachium.  A 2 g of Ancef were administered as an IV prophylactic antibiotic.  Following routine surgical time-out, the arm was exsanguinated with an Esmarch bandage and the arterial tourniquet inflated to 220 mmHg. Procedure commenced with a standard DVR incision, paralleling the path of the flexor carpi radialis.  Subcutaneous tissues were carefully divided, taking care to identify the palmaris longus and the flexor carpi radialis.  The fascia deep to the flexor carpi radialis was split with scissors and the flexor pollicis longus retracted ulnarly.  Vickie Holloway was noted to have a very unusual anatomic variant where she had a large anastomosis/branch between the palmar radial superficial sensory branch and the median nerve.  This is not typically described anomaly, however, we took great care to preserve this anastomosis throughout the procedure.  The pronator quad radius was identified and elevated off its radial insertion followed by selection of a 6-pegged mini DVR plate.  This was carefully positioned with the aid of C-arm fluoroscope on the volar aspect of the  radius followed by 3-point molding of the fracture to an anatomic position with recovery of radial slope length and volar tilt.  The pegs were then deployed distally after securing the plate with a single screw in the sliding hole proximally.  Care was taken to use a C-arm fluoroscope to control peg position length and proximity to the articular surface.  Ultimately, 2 threaded pegs were deployed into the radial styloid region and 4 smooth pegs into the ulnar aspect of the distal metaphysis and epiphysis.  Care was taken to control peg length, so that no dorsal penetration occurred and care was taken also to be certain that the pegs were deep to the articular surface at the lunate facet.  The wound was then thoroughly lavaged with sterile saline followed by completion of plate fixation to the metaphysis with two 3.5-mm cortical screws.  The pronator quadratus was repaired over the distal plate to protect the flexor tendons followed by repair of the skin with subcutaneous 3-0 Vicryl and intradermal segmental 3-0 Prolene.  Steri- Strips were applied followed by application of a voluminous dressing and a sugar-tong splint maintaining the wrist in 30 degrees of supination.  There were no apparent complications.  For aftercare, Vickie Holloway has been already provided prescriptions for Dilaudid 2 mg 1 or 2 tablets p.o. q.4-6 hours p.r.n. pain, 20 tablets without refill; also Keflex 500 mg 1 p.o. q.8 hours x4 days as a prophylactic antibiotic.  She will use Aleve or over-the-counter nonsteroidal medication as necessary.     Vickie Holloway, M.D.    RVS/MEDQ  D:  01/01/2013  T:  01/02/2013  Job:  [id number]

## 2013-01-07 ENCOUNTER — Encounter (HOSPITAL_BASED_OUTPATIENT_CLINIC_OR_DEPARTMENT_OTHER): Payer: Self-pay | Admitting: Orthopedic Surgery

## 2013-01-09 DIAGNOSIS — S52599A Other fractures of lower end of unspecified radius, initial encounter for closed fracture: Secondary | ICD-10-CM | POA: Diagnosis not present

## 2013-01-15 ENCOUNTER — Encounter (HOSPITAL_BASED_OUTPATIENT_CLINIC_OR_DEPARTMENT_OTHER): Payer: Self-pay | Admitting: Orthopedic Surgery

## 2013-02-06 DIAGNOSIS — S52599A Other fractures of lower end of unspecified radius, initial encounter for closed fracture: Secondary | ICD-10-CM | POA: Diagnosis not present

## 2014-08-08 ENCOUNTER — Ambulatory Visit (INDEPENDENT_AMBULATORY_CARE_PROVIDER_SITE_OTHER): Payer: Medicare Other | Admitting: Internal Medicine

## 2014-08-08 VITALS — BP 142/86 | HR 72 | Temp 98.0°F | Resp 16 | Ht 65.5 in | Wt 129.0 lb

## 2014-08-08 DIAGNOSIS — H9202 Otalgia, left ear: Secondary | ICD-10-CM | POA: Diagnosis not present

## 2014-08-08 DIAGNOSIS — G4700 Insomnia, unspecified: Secondary | ICD-10-CM

## 2014-08-08 MED ORDER — CLONAZEPAM 0.5 MG PO TABS: 0.5000 mg | ORAL_TABLET | Freq: Every day | ORAL | Status: DC

## 2014-08-08 NOTE — Progress Notes (Signed)
   Subjective:    Patient ID: Vickie Holloway, female    DOB: 19-Jan-1946, 69 y.o.   MRN: [id number]  HPI Has ear pressure and irritation, flutters and pops. Not sick, no congestion, allergys. Possible hearing change. Sleep and stress issues  Review of Systems     Objective:   Physical Exam  Constitutional: She is oriented to person, place, and time. She appears well-developed and well-nourished. No distress.  HENT:  Head: Normocephalic.  Right Ear: Hearing, tympanic membrane, external ear and ear canal normal.  Left Ear: Tympanic membrane and external ear normal. No drainage or tenderness. A foreign body is present.  No middle ear effusion. Decreased hearing is noted.  Nose: Nose normal.  Mouth/Throat: Oropharynx is clear and moist.  Eyes: EOM are normal. Pupils are equal, round, and reactive to light.  Cardiovascular: Normal rate.   Pulmonary/Chest: Effort normal.  Neurological: She is alert and oriented to person, place, and time. She exhibits normal muscle tone. Coordination normal.  Psychiatric: She has a normal mood and affect. Her behavior is normal. Judgment and thought content normal.    Irrigated cerumen off left TM, canal clear      Assessment & Plan:  See ENTif persists Insomnia trial klonipin prn hs

## 2014-08-08 NOTE — Patient Instructions (Signed)
Stress Stress-related medical problems are becoming increasingly common. The body has a built-in physical response to stressful situations. Faced with pressure, challenge or danger, we need to react quickly. Our bodies release hormones such as cortisol and adrenaline to help do this. These hormones are part of the "fight or flight" response and affect the metabolic rate, heart rate and blood pressure, resulting in a heightened, stressed state that prepares the body for optimum performance in dealing with a stressful situation. It is likely that early man required these mechanisms to stay alive, but usually modern stresses do not call for this, and the same hormones released in today's world can damage health and reduce coping ability. CAUSES  Pressure to perform at work, at school or in sports.  Threats of physical violence.  Money worries.  Arguments.  Family conflicts.  Divorce or separation from significant other.  Bereavement.  New job or unemployment.  Changes in location.  Alcohol or drug abuse. SOMETIMES, THERE IS NO PARTICULAR REASON FOR DEVELOPING STRESS. Almost all people are at risk of being stressed at some time in their lives. It is important to know that some stress is temporary and some is long term.  Temporary stress will go away when a situation is resolved. Most people can cope with short periods of stress, and it can often be relieved by relaxing, taking a walk or getting any type of exercise, chatting through issues with friends, or having a good night's sleep.  Chronic (long-term, continuous) stress is much harder to deal with. It can be psychologically and emotionally damaging. It can be harmful both for an individual and for friends and family. SYMPTOMS Everyone reacts to stress differently. There are some common effects that help us recognize it. In times of extreme stress, people may:  Shake uncontrollably.  Breathe faster and deeper than normal  (hyperventilate).  Vomit.  For people with asthma, stress can trigger an attack.  For some people, stress may trigger migraine headaches, ulcers, and body pain. PHYSICAL EFFECTS OF STRESS MAY INCLUDE:  Loss of energy.  Skin problems.  Aches and pains resulting from tense muscles, including neck ache, backache and tension headaches.  Increased pain from arthritis and other conditions.  Irregular heart beat (palpitations).  Periods of irritability or anger.  Apathy or depression.  Anxiety (feeling uptight or worrying).  Unusual behavior.  Loss of appetite.  Comfort eating.  Lack of concentration.  Loss of, or decreased, sex-drive.  Increased smoking, drinking, or recreational drug use.  For women, missed periods.  Ulcers, joint pain, and muscle pain. Post-traumatic stress is the stress caused by any serious accident, strong emotional damage, or extremely difficult or violent experience such as rape or war. Post-traumatic stress victims can experience mixtures of emotions such as fear, shame, depression, guilt or anger. It may include recurrent memories or images that may be haunting. These feelings can last for weeks, months or even years after the traumatic event that triggered them. Specialized treatment, possibly with medicines and psychological therapies, is available. If stress is causing physical symptoms, severe distress or making it difficult for you to function as normal, it is worth seeing your caregiver. It is important to remember that although stress is a usual part of life, extreme or prolonged stress can lead to other illnesses that will need treatment. It is better to visit a doctor sooner rather than later. Stress has been linked to the development of high blood pressure and heart disease, as well as insomnia and depression.   There is no diagnostic test for stress since everyone reacts to it differently. But a caregiver will be able to spot the physical  symptoms, such as:  Headaches.  Shingles.  Ulcers. Emotional distress such as intense worry, low mood or irritability should be detected when the doctor asks pertinent questions to identify any underlying problems that might be the cause. In case there are physical reasons for the symptoms, the doctor may also want to do some tests to exclude certain conditions. If you feel that you are suffering from stress, try to identify the aspects of your life that are causing it. Sometimes you may not be able to change or avoid them, but even a small change can have a positive ripple effect. A simple lifestyle change can make all the difference. STRATEGIES THAT CAN HELP DEAL WITH STRESS:  Delegating or sharing responsibilities.  Avoiding confrontations.  Learning to be more assertive.  Regular exercise.  Avoid using alcohol or street drugs to cope.  Eating a healthy, balanced diet, rich in fruit and vegetables and proteins.  Finding humor or absurdity in stressful situations.  Never taking on more than you know you can handle comfortably.  Organizing your time better to get as much done as possible.  Talking to friends or family and sharing your thoughts and fears.  Listening to music or relaxation tapes.  Relaxation techniques like deep breathing, meditation, and yoga.  Tensing and then relaxing your muscles, starting at the toes and working up to the head and neck. If you think that you would benefit from help, either in identifying the things that are causing your stress or in learning techniques to help you relax, see a caregiver who is capable of helping you with this. Rather than relying on medications, it is usually better to try and identify the things in your life that are causing stress and try to deal with them. There are many techniques of managing stress including counseling, psychotherapy, aromatherapy, yoga, and exercise. Your caregiver can help you determine what is best  for you. Document Released: 05/28/2002 Document Revised: 03/12/2013 Document Reviewed: 04/24/2007 [facility] Patient Information 2015 [place], [place]. This information is not intended to replace advice given to you by your health care provider. Make sure you discuss any questions you have with your health care provider. Insomnia Insomnia is frequent trouble falling and/or staying asleep. Insomnia can be a long term problem or a short term problem. Both are common. Insomnia can be a short term problem when the wakefulness is related to a certain stress or worry. Long term insomnia is often related to ongoing stress during waking hours and/or poor sleeping habits. Overtime, sleep deprivation itself can make the problem worse. Every little thing feels more severe because you are overtired and your ability to cope is decreased. CAUSES   Stress, anxiety, and depression.  Poor sleeping habits.  Distractions such as TV in the bedroom.  Naps close to bedtime.  Engaging in emotionally charged conversations before bed.  Technical reading before sleep.  Alcohol and other sedatives. They may make the problem worse. They can hurt normal sleep patterns and normal dream activity.  Stimulants such as caffeine for several hours prior to bedtime.  Pain syndromes and shortness of breath can cause insomnia.  Exercise late at night.  Changing time zones may cause sleeping problems (jet lag). It is sometimes helpful to have someone observe your sleeping patterns. They should look for periods of not breathing during the night (sleep apnea). They  should also look to see how long those periods last. If you live alone or observers are uncertain, you can also be observed at a sleep clinic where your sleep patterns will be professionally monitored. Sleep apnea requires a checkup and treatment. Give your caregivers your medical history. Give your caregivers observations your family has made about your sleep.  SYMPTOMS    Not feeling rested in the morning.  Anxiety and restlessness at bedtime.  Difficulty falling and staying asleep. TREATMENT   Your caregiver may prescribe treatment for an underlying medical disorders. Your caregiver can give advice or help if you are using alcohol or other drugs for self-medication. Treatment of underlying problems will usually eliminate insomnia problems.  Medications can be prescribed for short time use. They are generally not recommended for lengthy use.  Over-the-counter sleep medicines are not recommended for lengthy use. They can be habit forming.  You can promote easier sleeping by making lifestyle changes such as:  Using relaxation techniques that help with breathing and reduce muscle tension.  Exercising earlier in the day.  Changing your diet and the time of your last meal. No night time snacks.  Establish a regular time to go to bed.  Counseling can help with stressful problems and worry.  Soothing music and white noise may be helpful if there are background noises you cannot remove.  Stop tedious detailed work at least one hour before bedtime. HOME CARE INSTRUCTIONS   Keep a diary. Inform your caregiver about your progress. This includes any medication side effects. See your caregiver regularly. Take note of:  Times when you are asleep.  Times when you are awake during the night.  The quality of your sleep.  How you feel the next day. This information will help your caregiver care for you.  Get out of bed if you are still awake after 15 minutes. Read or do some quiet activity. Keep the lights down. Wait until you feel sleepy and go back to bed.  Keep regular sleeping and waking hours. Avoid naps.  Exercise regularly.  Avoid distractions at bedtime. Distractions include watching television or engaging in any intense or detailed activity like attempting to balance the household checkbook.  Develop a bedtime ritual. Keep a familiar  routine of bathing, brushing your teeth, climbing into bed at the same time each night, listening to soothing music. Routines increase the success of falling to sleep faster.  Use relaxation techniques. This can be using breathing and muscle tension release routines. It can also include visualizing peaceful scenes. You can also help control troubling or intruding thoughts by keeping your mind occupied with boring or repetitive thoughts like the old concept of counting sheep. You can make it more creative like imagining planting one beautiful flower after another in your backyard garden.  During your day, work to eliminate stress. When this is not possible use some of the previous suggestions to help reduce the anxiety that accompanies stressful situations. MAKE SURE YOU:   Understand these instructions.  Will watch your condition.  Will get help right away if you are not doing well or get worse. Document Released: 03/04/2000 Document Revised: 05/30/2011 Document Reviewed: 04/04/2007 [facility] Patient Information 2015 [place], [place]. This information is not intended to replace advice given to you by your health care provider. Make sure you discuss any questions you have with your health care provider.

## 2015-06-08 DIAGNOSIS — Z23 Encounter for immunization: Secondary | ICD-10-CM | POA: Diagnosis not present

## 2015-06-08 DIAGNOSIS — S0101XA Laceration without foreign body of scalp, initial encounter: Secondary | ICD-10-CM | POA: Diagnosis not present

## 2017-09-25 DIAGNOSIS — F4322 Adjustment disorder with anxiety: Secondary | ICD-10-CM | POA: Diagnosis not present

## 2017-10-02 ENCOUNTER — Encounter (HOSPITAL_COMMUNITY): Payer: Self-pay | Admitting: Emergency Medicine

## 2017-10-02 ENCOUNTER — Other Ambulatory Visit: Payer: Self-pay

## 2017-10-02 ENCOUNTER — Ambulatory Visit (HOSPITAL_COMMUNITY)
Admission: EM | Admit: 2017-10-02 | Discharge: 2017-10-02 | Disposition: A | Discharge status: Home / Self Care | Payer: Medicare Other | Attending: Family Medicine | Admitting: Family Medicine

## 2017-10-02 DIAGNOSIS — I8002 Phlebitis and thrombophlebitis of superficial vessels of left lower extremity: Secondary | ICD-10-CM | POA: Diagnosis not present

## 2017-10-02 MED ORDER — IBUPROFEN 800 MG PO TABS: 800.0000 mg | ORAL_TABLET | Freq: Three times a day (TID) | ORAL | 0 refills | Status: DC

## 2017-10-02 NOTE — ED Triage Notes (Signed)
Left leg is swollen. Noticed this on Friday. Patient says she has always had a soft area mid lower leg, but it is now tender and swollen and lower leg is swollen.  No known injury.  Patient does recall playing with a grandchild and reports opportunities for having been run into , but no incidents noticed.

## 2017-10-02 NOTE — ED Provider Notes (Signed)
[facility]    CSN: [id number] Arrival date & time: 10/02/17  1910     History   Chief Complaint Chief Complaint  Patient presents with  . Leg Pain    HPI Vickie Holloway is a 72 y.o. female.   HPI  Healthy 72 year old.  Here with her husband.  She states that recently has had some stress and anxiety was placed on Zoloft.  Trazodone and Pamelor have been tried for assistance with sleeping.  Other than that she is not on any medicine except for vitamins and calcium.  No hormones.  No cancers.  No other health issues.  Non-smoker.  Never had any blood clots or clotting disorders. She has some pain and swelling in her left leg.  Is been present since last Friday.  (4 days).  Swelling appears to be getting a bit worse.  The area is tender and red.  She states she thinks it might be a "blood clot".  She states "I am terrified".  No chest pain or shortness of breath.  Past Medical History:  Diagnosis Date  . Anxiety   . Arthritis     There are no active problems to display for this patient.   Past Surgical History:  Procedure Laterality Date  . ABDOMINAL HYSTERECTOMY  1985  . COSMETIC SURGERY  1999   face lift  . DILATION AND CURETTAGE OF UTERUS    . ORIF WRIST FRACTURE Left 01/01/2013   Procedure: OPEN REDUCTION INTERNAL FIXATION (ORIF)OF LEFT DISTAL RADIUS FRACTURE;  Surgeon: Cammie Sickle., MD;  Location: [place];  Service: Orthopedics;  Laterality: Left;  . REFRACTIVE SURGERY    . TONSILLECTOMY      OB History   None      Home Medications    Prior to Admission medications   Medication Sig Start Date End Date Taking? Authorizing Provider  NON FORMULARY  Per discussion with patient, Pamelor-YSN Patient says there is another med that she takes at night, new medicine and cannot remember the name of it   Yes [provider]  TRAZODONE HCL PO Take by mouth.   Yes [provider]  calcium carbonate (OS-CAL - DOSED IN  MG OF ELEMENTAL CALCIUM) 1250 MG tablet Take 1 tablet by mouth.    [provider]  ibuprofen (ADVIL,MOTRIN) 800 MG tablet Take 1 tablet (800 mg total) by mouth 3 (three) times daily. 10/02/17   Raylene Everts, MD  Multiple Vitamins-Minerals (MULTIVITAMIN WITH MINERALS) tablet Take 1 tablet by mouth daily.    [provider]  sertraline (ZOLOFT) 50 MG tablet Take 1 tablet (50 mg total) by mouth daily. 09/27/12   Le, Thao P, DO  vitamin C (ASCORBIC ACID) 500 MG tablet Take 500 mg by mouth daily.    [provider]    Family History Family History  Problem Relation Age of Onset  . Stroke Mother     Social History Social History   Tobacco Use  . Smoking status: Former Smoker    Last attempt to quit: 01/01/1979    Years since quitting: 38.7  Substance Use Topics  . Alcohol use: Yes    Comment: occ  . Drug use: No     Allergies   Patient has no known allergies.   Review of Systems Review of Systems  Constitutional: Negative for chills and fever.  HENT: Negative for ear pain and sore throat.   Eyes: Negative for pain and visual disturbance.  Respiratory: Negative for cough and shortness of breath.   Cardiovascular: Positive for leg swelling. Negative for chest pain and palpitations.  Gastrointestinal: Negative for abdominal pain and vomiting.  Genitourinary: Negative for dysuria and hematuria.  Musculoskeletal: Negative for arthralgias and back pain.  Skin: Positive for color change. Negative for rash.  Neurological: Negative for seizures and syncope.  All other systems reviewed and are negative.    Physical Exam Triage Vital Signs ED Triage Vitals  Enc Vitals Group     BP 10/02/17 1921 (!) 156/84     Pulse Rate 10/02/17 1921 84     Resp 10/02/17 1921 16     Temp 10/02/17 1921 97.9 F (36.6 C)     Temp Source 10/02/17 1921 Oral     SpO2 10/02/17 1921 96 %     Weight --      Height --      Head Circumference --      Peak Flow --       Pain Score 10/02/17 1923 4     Pain Loc --      Pain Edu? --      Excl. in [place]? --    No data found.  Updated Vital Signs BP (!) 156/84 (BP Location: Right Arm)   Pulse 84   Temp 97.9 F (36.6 C) (Oral)   Resp 16   SpO2 96%    Physical Exam  Constitutional: She appears well-developed and well-nourished. No distress.  HENT:  Head: Normocephalic and atraumatic.  Mouth/Throat: Oropharynx is clear and moist.  Eyes: Pupils are equal, round, and reactive to light. Conjunctivae are normal.  Neck: Normal range of motion.  Cardiovascular: Normal rate, regular rhythm and normal heart sounds.  Pulmonary/Chest: Effort normal and breath sounds normal. No stridor. No respiratory distress. She has no wheezes.  Abdominal: Soft. She exhibits no distension.  Musculoskeletal: Normal range of motion. She exhibits no edema.       Legs: Legs appear symmetric.  Superficial phlebitis on the left anterior leg as diagrammed.  It is not palpable to the posterior calf or knee pain systems.  Calf is soft and nontender.  Range of motion of the knee and ankle are normal.  No pain with ambulation.  Negative Homans sign.  Neurological: She is alert.  Skin: Skin is warm and dry.  Psychiatric: She has a normal mood and affect. Her behavior is normal.  Mildly anxious     UC Treatments / Results  Labs (all labs ordered are listed, but only abnormal results are displayed) Labs Reviewed - No data to display  EKG None  Radiology No results found.  Procedures Procedures (including critical care time)  Medications Ordered in UC Medications - No data to display  Initial Impression / Assessment and Plan / UC Course  I have reviewed the triage vital signs and the nursing notes.  Pertinent labs & imaging results that were available during my care of the patient were reviewed by me and considered in my medical decision making (see chart for details).     Discussed with patient that in my opinion this is  a superficial phlebitis.  I believe it can be treated with ibuprofen warmth rest and leg elevation.  I did warn her if it gets worse instead of better and like her to come back for recheck.  She could need Doppler evaluation of the swelling and pain get worse.  Rarely, these can extend into the deep system.  She should be  able to tell if this is happening with increased symptoms. She is at low risk for DVT.  No risk factors identified.  Has not smoked since 1980.  Understands if she has recurring superficial phlebitis that additional work-up would be indicated. Final Clinical Impressions(s) / UC Diagnoses   Final diagnoses:  Thrombophlebitis of superficial veins of left lower extremity     Discharge Instructions     Warmth to area Take the ibuprofen for pain and inflammation Limit activity while painful Elevate leg to reduce swelling Return if worse, if swelling or redness increase, or if concerns develop   ED Prescriptions    Medication Sig Dispense Auth. Provider   ibuprofen (ADVIL,MOTRIN) 800 MG tablet Take 1 tablet (800 mg total) by mouth 3 (three) times daily. 21 tablet Raylene Everts, MD     Controlled Substance Prescriptions [place] Controlled Substance Registry consulted? Not Applicable   Raylene Everts, MD 10/02/17 2044

## 2017-10-02 NOTE — Discharge Instructions (Addendum)
Warmth to area Take the ibuprofen for pain and inflammation Limit activity while painful Elevate leg to reduce swelling Return if worse, if swelling or redness increase, or if concerns develop

## 2017-10-05 DIAGNOSIS — F39 Unspecified mood [affective] disorder: Secondary | ICD-10-CM | POA: Diagnosis not present

## 2017-10-05 DIAGNOSIS — I809 Phlebitis and thrombophlebitis of unspecified site: Secondary | ICD-10-CM | POA: Diagnosis not present

## 2017-10-05 DIAGNOSIS — S61212A Laceration without foreign body of right middle finger without damage to nail, initial encounter: Secondary | ICD-10-CM | POA: Diagnosis not present

## 2017-10-05 DIAGNOSIS — M79644 Pain in right finger(s): Secondary | ICD-10-CM | POA: Diagnosis not present

## 2017-10-27 ENCOUNTER — Other Ambulatory Visit: Payer: Self-pay

## 2017-10-27 ENCOUNTER — Encounter (HOSPITAL_COMMUNITY): Payer: Self-pay

## 2017-10-27 ENCOUNTER — Ambulatory Visit (HOSPITAL_COMMUNITY)
Admission: EM | Admit: 2017-10-27 | Discharge: 2017-10-27 | Disposition: A | Discharge status: Home / Self Care | Payer: Medicare Other | Attending: Family Medicine | Admitting: Family Medicine

## 2017-10-27 DIAGNOSIS — M5442 Lumbago with sciatica, left side: Secondary | ICD-10-CM | POA: Diagnosis not present

## 2017-10-27 MED ORDER — CYCLOBENZAPRINE HCL 10 MG PO TABS: 10.0000 mg | ORAL_TABLET | Freq: Two times a day (BID) | ORAL | 0 refills | Status: DC | PRN

## 2017-10-27 NOTE — ED Triage Notes (Signed)
Back pain shooting and burning the back of her leg ( left ).

## 2017-10-27 NOTE — Discharge Instructions (Signed)
Use anti-inflammatories for pain/swelling. You may take up to 600-800 mg Ibuprofen every 8 hours with food. You may supplement Ibuprofen with Tylenol [phone] mg every 8 hours.   You may use flexeril as needed to help with pain. This is a muscle relaxer and causes sedation- please use only at bedtime or when you will be home and not have to drive/work-please begin with 1/2 tablet  Please follow-up if symptoms worsening, developing loss of bowel or bladder control worsening pain, weakness

## 2017-10-28 NOTE — ED Provider Notes (Signed)
[place]    CSN: [id number] Arrival date & time: 10/27/17  1803     History   Chief Complaint Chief Complaint  Patient presents with  . Back Pain    HPI Vickie Holloway is a 72 y.o. female history of arthritis presenting today for evaluation of back pain.  She states for the past 2 weeks she has had back pain, that has radiated into her left leg.  Her main complaint is the pain in her left leg.  Notes to have some tingling sensations in her left foot.  States that symptoms began after she was [place] a [place] at the beach, and then sitting in the car for a long time.  Patient has still been active, taking ibuprofen without relief.  She is having difficulty getting comfortable and sleeping at night.  Denies any loss of bowel or bladder control.  Denies any saddle anesthesia.  States that she has had issues similar previously many years ago.  HPI  Past Medical History:  Diagnosis Date  . Anxiety   . Arthritis     There are no active problems to display for this patient.   Past Surgical History:  Procedure Laterality Date  . ABDOMINAL HYSTERECTOMY  1985  . COSMETIC SURGERY  1999   face lift  . DILATION AND CURETTAGE OF UTERUS    . ORIF WRIST FRACTURE Left 01/01/2013   Procedure: OPEN REDUCTION INTERNAL FIXATION (ORIF)OF LEFT DISTAL RADIUS FRACTURE;  Surgeon: Cammie Sickle., MD;  Location: [facility];  Service: Orthopedics;  Laterality: Left;  . REFRACTIVE SURGERY    . TONSILLECTOMY      OB History   None      Home Medications    Prior to Admission medications   Medication Sig Start Date End Date Taking? Authorizing Provider  calcium carbonate (OS-CAL - DOSED IN MG OF ELEMENTAL CALCIUM) 1250 MG tablet Take 1 tablet by mouth.    [provider]  cyclobenzaprine (FLEXERIL) 10 MG tablet Take 1 tablet (10 mg total) by mouth 2 (two) times daily as needed for muscle spasms. 10/27/17   Stryder Poitra C, PA-C  ibuprofen  (ADVIL,MOTRIN) 800 MG tablet Take 1 tablet (800 mg total) by mouth 3 (three) times daily. 10/02/17   Raylene Everts, MD  Multiple Vitamins-Minerals (MULTIVITAMIN WITH MINERALS) tablet Take 1 tablet by mouth daily.    [provider]  NON FORMULARY Patient says there is another med that she takes at night, new medicine and cannot remember the name of it    [provider]  sertraline (ZOLOFT) 50 MG tablet Take 1 tablet (50 mg total) by mouth daily. 09/27/12   Le, Thao P, DO  TRAZODONE HCL PO Take by mouth.    [provider]  vitamin C (ASCORBIC ACID) 500 MG tablet Take 500 mg by mouth daily.    [provider]    Family History Family History  Problem Relation Age of Onset  . Stroke Mother     Social History Social History   Tobacco Use  . Smoking status: Former Smoker    Last attempt to quit: 01/01/1979    Years since quitting: 38.8  . Smokeless tobacco: Former Network engineer Use Topics  . Alcohol use: Yes    Comment: occ  . Drug use: No     Allergies   Patient has no known allergies.   Review of Systems Review of Systems  Constitutional: Negative for fatigue  and fever.  HENT: Negative for congestion, sinus pressure and sore throat.   Eyes: Negative for photophobia, pain and visual disturbance.  Respiratory: Negative for cough and shortness of breath.   Cardiovascular: Negative for chest pain.  Gastrointestinal: Negative for abdominal pain, nausea and vomiting.  Genitourinary: Negative for decreased urine volume and hematuria.  Musculoskeletal: Positive for back pain and myalgias. Negative for gait problem, neck pain and neck stiffness.  Skin: Negative for color change.  Neurological: Positive for numbness. Negative for dizziness, syncope, facial asymmetry, speech difficulty, weakness, light-headedness and headaches.     Physical Exam Triage Vital Signs ED Triage Vitals  Enc Vitals Group     BP 10/27/17 1847 (!) 167/93      Pulse Rate 10/27/17 1847 77     Resp 10/27/17 1847 16     Temp 10/27/17 1847 98.3 F (36.8 C)     Temp Source 10/27/17 1847 Oral     SpO2 10/27/17 1847 100 %     Weight 10/27/17 1846 116 lb (52.6 kg)     Height --      Head Circumference --      Peak Flow --      Pain Score 10/27/17 1846 7     Pain Loc --      Pain Edu? --      Excl. in [place]? --    No data found.  Updated Vital Signs BP (!) 167/93   Pulse 77   Temp 98.3 F (36.8 C) (Oral)   Resp 16   Wt 116 lb (52.6 kg)   SpO2 100%   BMI 19.01 kg/m   Visual Acuity Right Eye Distance:   Left Eye Distance:   Bilateral Distance:    Right Eye Near:   Left Eye Near:    Bilateral Near:     Physical Exam  Constitutional: She is oriented to person, place, and time. She appears well-developed and well-nourished.  No acute distress  HENT:  Head: Normocephalic and atraumatic.  Nose: Nose normal.  Eyes: Conjunctivae are normal.  Neck: Neck supple.  Cardiovascular: Normal rate.  Pulmonary/Chest: Effort normal. No respiratory distress.  Abdominal: She exhibits no distension.  Musculoskeletal: Normal range of motion.  Nontender to palpation of cervical, thoracic and lumbar spine midline.  Mild tenderness with deep palpation to left lumbar/gluteal musculature.  Positive straight leg raise on left, strength 5/5 and equal bilaterally at hips, patellar reflex 2+ bilaterally.  Neurological: She is alert and oriented to person, place, and time.  Skin: Skin is warm and dry.  Psychiatric: She has a normal mood and affect.  Nursing note and vitals reviewed.    UC Treatments / Results  Labs (all labs ordered are listed, but only abnormal results are displayed) Labs Reviewed - No data to display  EKG None  Radiology No results found.  Procedures Procedures (including critical care time)  Medications Ordered in UC Medications - No data to display  Initial Impression / Assessment and Plan / UC Course  I have reviewed the  triage vital signs and the nursing notes.  Pertinent labs & imaging results that were available during my care of the patient were reviewed by me and considered in my medical decision making (see chart for details).     Patient with low back pain with radicular distribution on left side.  Will have patient continue anti-inflammatories with Tylenol and ibuprofen, will add in Flexeril at bedtime, advised to begin with 1/2 tablet given age.Discussed strict return  precautions. Patient verbalized understanding and is agreeable with plan.  Final Clinical Impressions(s) / UC Diagnoses   Final diagnoses:  Acute left-sided low back pain with left-sided sciatica     Discharge Instructions     Use anti-inflammatories for pain/swelling. You may take up to 600-800 mg Ibuprofen every 8 hours with food. You may supplement Ibuprofen with Tylenol [phone] mg every 8 hours.   You may use flexeril as needed to help with pain. This is a muscle relaxer and causes sedation- please use only at bedtime or when you will be home and not have to drive/work-please begin with 1/2 tablet  Please follow-up if symptoms worsening, developing loss of bowel or bladder control worsening pain, weakness   ED Prescriptions    Medication Sig Dispense Auth. Provider   cyclobenzaprine (FLEXERIL) 10 MG tablet Take 1 tablet (10 mg total) by mouth 2 (two) times daily as needed for muscle spasms. 20 tablet Jamell Laymon, [place] C, PA-C     Controlled Substance Prescriptions [place] Controlled Substance Registry consulted? Not Applicable   Janith Lima, [place] 10/28/17 1000

## 2017-11-01 DIAGNOSIS — I10 Essential (primary) hypertension: Secondary | ICD-10-CM | POA: Diagnosis not present

## 2017-11-01 DIAGNOSIS — M5432 Sciatica, left side: Secondary | ICD-10-CM | POA: Diagnosis not present

## 2017-11-01 DIAGNOSIS — F411 Generalized anxiety disorder: Secondary | ICD-10-CM | POA: Diagnosis not present

## 2017-11-01 DIAGNOSIS — I809 Phlebitis and thrombophlebitis of unspecified site: Secondary | ICD-10-CM | POA: Diagnosis not present

## 2017-11-01 DIAGNOSIS — F39 Unspecified mood [affective] disorder: Secondary | ICD-10-CM | POA: Diagnosis not present

## 2017-11-01 DIAGNOSIS — T24032A Burn of unspecified degree of left lower leg, initial encounter: Secondary | ICD-10-CM | POA: Diagnosis not present

## 2017-11-02 ENCOUNTER — Other Ambulatory Visit: Payer: Self-pay | Admitting: Family Medicine

## 2017-11-02 DIAGNOSIS — R5381 Other malaise: Secondary | ICD-10-CM

## 2017-11-13 ENCOUNTER — Other Ambulatory Visit: Payer: Self-pay | Admitting: Family Medicine

## 2017-11-13 DIAGNOSIS — M8580 Other specified disorders of bone density and structure, unspecified site: Secondary | ICD-10-CM

## 2017-11-30 DIAGNOSIS — M5432 Sciatica, left side: Secondary | ICD-10-CM | POA: Diagnosis not present

## 2017-11-30 DIAGNOSIS — I10 Essential (primary) hypertension: Secondary | ICD-10-CM | POA: Diagnosis not present

## 2017-11-30 DIAGNOSIS — F39 Unspecified mood [affective] disorder: Secondary | ICD-10-CM | POA: Diagnosis not present

## 2017-11-30 DIAGNOSIS — F411 Generalized anxiety disorder: Secondary | ICD-10-CM | POA: Diagnosis not present

## 2017-12-04 DIAGNOSIS — L570 Actinic keratosis: Secondary | ICD-10-CM | POA: Diagnosis not present

## 2017-12-04 DIAGNOSIS — D225 Melanocytic nevi of trunk: Secondary | ICD-10-CM | POA: Diagnosis not present

## 2017-12-04 DIAGNOSIS — L821 Other seborrheic keratosis: Secondary | ICD-10-CM | POA: Diagnosis not present

## 2017-12-04 DIAGNOSIS — L738 Other specified follicular disorders: Secondary | ICD-10-CM | POA: Diagnosis not present

## 2018-01-05 DIAGNOSIS — Z23 Encounter for immunization: Secondary | ICD-10-CM | POA: Diagnosis not present

## 2018-04-03 DIAGNOSIS — F411 Generalized anxiety disorder: Secondary | ICD-10-CM | POA: Diagnosis not present

## 2018-04-03 DIAGNOSIS — L0390 Cellulitis, unspecified: Secondary | ICD-10-CM | POA: Diagnosis not present

## 2018-04-03 DIAGNOSIS — I10 Essential (primary) hypertension: Secondary | ICD-10-CM | POA: Diagnosis not present

## 2018-06-20 DIAGNOSIS — I10 Essential (primary) hypertension: Secondary | ICD-10-CM | POA: Diagnosis not present

## 2018-06-20 DIAGNOSIS — F411 Generalized anxiety disorder: Secondary | ICD-10-CM | POA: Diagnosis not present

## 2018-07-24 DIAGNOSIS — F411 Generalized anxiety disorder: Secondary | ICD-10-CM | POA: Diagnosis not present

## 2018-07-24 DIAGNOSIS — I10 Essential (primary) hypertension: Secondary | ICD-10-CM | POA: Diagnosis not present

## 2018-11-09 DIAGNOSIS — F411 Generalized anxiety disorder: Secondary | ICD-10-CM | POA: Diagnosis not present

## 2018-11-09 DIAGNOSIS — I10 Essential (primary) hypertension: Secondary | ICD-10-CM | POA: Diagnosis not present

## 2018-11-09 DIAGNOSIS — F39 Unspecified mood [affective] disorder: Secondary | ICD-10-CM | POA: Diagnosis not present

## 2018-11-15 DIAGNOSIS — F39 Unspecified mood [affective] disorder: Secondary | ICD-10-CM | POA: Diagnosis not present

## 2018-11-15 DIAGNOSIS — I10 Essential (primary) hypertension: Secondary | ICD-10-CM | POA: Diagnosis not present

## 2018-11-15 DIAGNOSIS — F411 Generalized anxiety disorder: Secondary | ICD-10-CM | POA: Diagnosis not present

## 2018-11-15 DIAGNOSIS — M5432 Sciatica, left side: Secondary | ICD-10-CM | POA: Diagnosis not present

## 2018-12-13 DIAGNOSIS — M5432 Sciatica, left side: Secondary | ICD-10-CM | POA: Diagnosis not present

## 2018-12-13 DIAGNOSIS — I10 Essential (primary) hypertension: Secondary | ICD-10-CM | POA: Diagnosis not present

## 2018-12-13 DIAGNOSIS — F39 Unspecified mood [affective] disorder: Secondary | ICD-10-CM | POA: Diagnosis not present

## 2018-12-13 DIAGNOSIS — Z23 Encounter for immunization: Secondary | ICD-10-CM | POA: Diagnosis not present

## 2019-01-10 DIAGNOSIS — M5432 Sciatica, left side: Secondary | ICD-10-CM | POA: Diagnosis not present

## 2019-01-10 DIAGNOSIS — F39 Unspecified mood [affective] disorder: Secondary | ICD-10-CM | POA: Diagnosis not present

## 2019-01-10 DIAGNOSIS — J3089 Other allergic rhinitis: Secondary | ICD-10-CM | POA: Diagnosis not present

## 2019-01-10 DIAGNOSIS — I10 Essential (primary) hypertension: Secondary | ICD-10-CM | POA: Diagnosis not present

## 2019-01-28 DIAGNOSIS — L819 Disorder of pigmentation, unspecified: Secondary | ICD-10-CM | POA: Diagnosis not present

## 2019-01-28 DIAGNOSIS — L821 Other seborrheic keratosis: Secondary | ICD-10-CM | POA: Diagnosis not present

## 2019-01-28 DIAGNOSIS — D1724 Benign lipomatous neoplasm of skin and subcutaneous tissue of left leg: Secondary | ICD-10-CM | POA: Diagnosis not present

## 2019-01-28 DIAGNOSIS — L720 Epidermal cyst: Secondary | ICD-10-CM | POA: Diagnosis not present

## 2019-01-28 DIAGNOSIS — D692 Other nonthrombocytopenic purpura: Secondary | ICD-10-CM | POA: Diagnosis not present

## 2019-01-28 DIAGNOSIS — D1801 Hemangioma of skin and subcutaneous tissue: Secondary | ICD-10-CM | POA: Diagnosis not present

## 2019-02-08 ENCOUNTER — Encounter (HOSPITAL_COMMUNITY): Payer: Self-pay

## 2019-02-08 ENCOUNTER — Other Ambulatory Visit: Payer: Self-pay

## 2019-02-08 ENCOUNTER — Ambulatory Visit (HOSPITAL_COMMUNITY)
Admission: EM | Admit: 2019-02-08 | Discharge: 2019-02-08 | Disposition: A | Discharge status: Home / Self Care | Payer: Medicare Other | Attending: Internal Medicine | Admitting: Internal Medicine

## 2019-02-08 DIAGNOSIS — M549 Dorsalgia, unspecified: Secondary | ICD-10-CM

## 2019-02-08 DIAGNOSIS — Z87891 Personal history of nicotine dependence: Secondary | ICD-10-CM | POA: Diagnosis not present

## 2019-02-08 DIAGNOSIS — I10 Essential (primary) hypertension: Secondary | ICD-10-CM | POA: Insufficient documentation

## 2019-02-08 DIAGNOSIS — R110 Nausea: Secondary | ICD-10-CM

## 2019-02-08 DIAGNOSIS — Z20828 Contact with and (suspected) exposure to other viral communicable diseases: Secondary | ICD-10-CM | POA: Diagnosis not present

## 2019-02-08 DIAGNOSIS — F419 Anxiety disorder, unspecified: Secondary | ICD-10-CM | POA: Diagnosis not present

## 2019-02-08 DIAGNOSIS — M1990 Unspecified osteoarthritis, unspecified site: Secondary | ICD-10-CM | POA: Insufficient documentation

## 2019-02-08 DIAGNOSIS — Z79899 Other long term (current) drug therapy: Secondary | ICD-10-CM | POA: Insufficient documentation

## 2019-02-08 HISTORY — DX: Allergy, unspecified, initial encounter: T78.40XA

## 2019-02-08 HISTORY — DX: Essential (primary) hypertension: I10

## 2019-02-08 LAB — POC SARS CORONAVIRUS 2 AG -  ED
SARS Coronavirus 2 Ag: NEGATIVE
SARS Coronavirus 2 Ag: NEGATIVE

## 2019-02-08 LAB — POC SARS CORONAVIRUS 2 AG: SARS Coronavirus 2 Ag: NEGATIVE

## 2019-02-08 MED ORDER — ONDANSETRON 4 MG PO TBDP: 4.0000 mg | ORAL_TABLET | Freq: Three times a day (TID) | ORAL | 0 refills | Status: DC | PRN

## 2019-02-08 MED ORDER — ONDANSETRON 4 MG PO TBDP: 4.0000 mg | ORAL_TABLET | Freq: Once | ORAL | Status: DC

## 2019-02-08 NOTE — Discharge Instructions (Signed)
Push fluids to ensure adequate hydration and keep secretions thin.  Zofran every 8 hours as needed for nausea or vomiting.  Bland diet as tolerated.  Your rapid covid test was negative, we will confirm this with our follow up test. Will notify of any positive findings and if any changes to treatment are needed.   Tylenol as needed for pain.  If any worsening of symptoms please return to be seen- pain, fevers, dehydration- or otherwise worsening.

## 2019-02-08 NOTE — ED Triage Notes (Signed)
Pt presents with facial pressure, nausea, generalized body aches, and fatigue X 2 days.

## 2019-02-08 NOTE — ED Provider Notes (Signed)
[place]    CSN: [id number] Arrival date & time: 02/08/19  1536      History   Chief Complaint Chief Complaint  Patient presents with  . Nausea  . Fatigue  . Generalized Body Aches  . Facial Congestion    HPI Vickie Holloway is a 73 y.o. female.   Vickie Holloway presents with complaints of body aches, worse to upper back, frontal headache which is mild as well as stomach upset. Started two days ago. Has been hot and cold with sweating, no fever. Nausea. Vomited the other night and the following morning had a loose bm. Now with normal bm. Decreased appetite. Ate a sandwich last night, otherwise no food today. Drank fluids. No abdominal pain. No vision change. No loss of taste or smell. She has a friend who had Covid although had recovered. Went to a wedding last week. Her husband had been in the ER and hospital last weekend. He had appendicitis. No nasal drainage. No sore throat or ear pain. Denies any previous similar. Hasn't taken any medications for symptoms. Currently headache is 1/10. She does take a sinus medication although doesn't recall what it is called. States has had issues with nausea/ stomach issues due to sinus drainange in the past, but once she started the medication her symptoms had improved.  States that nausea is the worst of her symptoms her tonight.  History  Of allergies, anxiety, arthritis, htn.    ROS per HPI, negative if not otherwise mentioned.      Past Medical History:  Diagnosis Date  . Allergy   . Anxiety   . Arthritis   . Hypertension     There are no active problems to display for this patient.   Past Surgical History:  Procedure Laterality Date  . ABDOMINAL HYSTERECTOMY  1985  . COSMETIC SURGERY  1999   face lift  . DILATION AND CURETTAGE OF UTERUS    . ORIF WRIST FRACTURE Left 01/01/2013   Procedure: OPEN REDUCTION INTERNAL FIXATION (ORIF)OF LEFT DISTAL RADIUS FRACTURE;  Surgeon: Cammie Sickle., MD;  Location:  [facility];  Service: Orthopedics;  Laterality: Left;  . REFRACTIVE SURGERY    . TONSILLECTOMY      OB History   No obstetric history on file.      Home Medications    Prior to Admission medications   Medication Sig Start Date End Date Taking? Authorizing Provider  calcium carbonate (OS-CAL - DOSED IN MG OF ELEMENTAL CALCIUM) 1250 MG tablet Take 1 tablet by mouth.    [provider]  cyclobenzaprine (FLEXERIL) 10 MG tablet Take 1 tablet (10 mg total) by mouth 2 (two) times daily as needed for muscle spasms. 10/27/17   Wieters, Hallie C, PA-C  ibuprofen (ADVIL,MOTRIN) 800 MG tablet Take 1 tablet (800 mg total) by mouth 3 (three) times daily. 10/02/17   Raylene Everts, MD  Multiple Vitamins-Minerals (MULTIVITAMIN WITH MINERALS) tablet Take 1 tablet by mouth daily.    [provider]  NON FORMULARY Patient says there is another med that she takes at night, new medicine and cannot remember the name of it    [provider]  ondansetron (ZOFRAN-ODT) 4 MG disintegrating tablet Take 1 tablet (4 mg total) by mouth every 8 (eight) hours as needed for nausea or vomiting. 02/08/19   Zigmund Gottron, NP  sertraline (ZOLOFT) 50 MG tablet Take 1 tablet (50 mg total) by mouth daily. 09/27/12   Marin Comment,  Thao P, DO  TRAZODONE HCL PO Take by mouth.    [provider]  vitamin C (ASCORBIC ACID) 500 MG tablet Take 500 mg by mouth daily.    [provider]    Family History Family History  Problem Relation Age of Onset  . Stroke Mother     Social History Social History   Tobacco Use  . Smoking status: Former Smoker    Quit date: 01/01/1979    Years since quitting: 40.1  . Smokeless tobacco: Former Network engineer Use Topics  . Alcohol use: Yes    Comment: occ  . Drug use: No     Allergies   Patient has no known allergies.   Review of Systems Review of Systems   Physical Exam Triage Vital Signs ED Triage Vitals  Enc Vitals  Group     BP 02/08/19 1625 (!) 149/85     Pulse Rate 02/08/19 1625 66     Resp 02/08/19 1625 16     Temp 02/08/19 1625 (!) 97.4 F (36.3 C)     Temp Source 02/08/19 1625 Oral     SpO2 02/08/19 1625 97 %     Weight --      Height --      Head Circumference --      Peak Flow --      Pain Score 02/08/19 1626 4     Pain Loc --      Pain Edu? --      Excl. in [place]? --    No data found.  Updated Vital Signs BP (!) 149/85 (BP Location: Left Arm)   Pulse 66   Temp (!) 97.4 F (36.3 C) (Oral)   Resp 16   SpO2 97%   Visual Acuity Right Eye Distance:   Left Eye Distance:   Bilateral Distance:    Right Eye Near:   Left Eye Near:    Bilateral Near:     Physical Exam Constitutional:      General: She is not in acute distress.    Appearance: She is well-developed.  Cardiovascular:     Rate and Rhythm: Normal rate.  Pulmonary:     Effort: Pulmonary effort is normal.  Abdominal:     Palpations: Abdomen is soft.     Tenderness: There is no abdominal tenderness.  Skin:    General: Skin is warm and dry.  Neurological:     General: No focal deficit present.     Mental Status: She is alert and oriented to person, place, and time. Mental status is at baseline.      UC Treatments / Results  Labs (all labs ordered are listed, but only abnormal results are displayed) Labs Reviewed  NOVEL CORONAVIRUS, NAA (HOSP ORDER, SEND-OUT TO REF LAB; TAT 18-24 HRS)  POC SARS CORONAVIRUS 2 AG -  ED  POC SARS CORONAVIRUS 2 AG  POC SARS CORONAVIRUS 2 AG -  ED    EKG   Radiology No results found.  Procedures Procedures (including critical care time)  Medications Ordered in UC Medications - No data to display  Initial Impression / Assessment and Plan / UC Course  I have reviewed the triage vital signs and the nursing notes.  Pertinent labs & imaging results that were available during my care of the patient were reviewed by me and considered in my medical decision making (see chart  for details).     Non toxic. Benign physical exam.  Without acute abdominal pain.  Vitals stable. Negative rapid covid testing. Send out confirmation testing collected and pending. zofran script provided with allergy treatment options discussed. Return precautions provided. If symptoms worsen or do not improve in the next week to return to be seen or to follow up with PCP.  Patient verbalized understanding and agreeable to plan.    Final Clinical Impressions(s) / UC Diagnoses   Final diagnoses:  Nausea  Upper back pain     Discharge Instructions     Push fluids to ensure adequate hydration and keep secretions thin.  Zofran every 8 hours as needed for nausea or vomiting.  Bland diet as tolerated.  Your rapid covid test was negative, we will confirm this with our follow up test. Will notify of any positive findings and if any changes to treatment are needed.   Tylenol as needed for pain.  If any worsening of symptoms please return to be seen- pain, fevers, dehydration- or otherwise worsening.     ED Prescriptions    Medication Sig Dispense Auth. Provider   ondansetron (ZOFRAN-ODT) 4 MG disintegrating tablet Take 1 tablet (4 mg total) by mouth every 8 (eight) hours as needed for nausea or vomiting. 12 tablet Zigmund Gottron, NP     PDMP not reviewed this encounter.   Zigmund Gottron, NP 02/08/19 2237

## 2019-02-09 LAB — NOVEL CORONAVIRUS, NAA (HOSP ORDER, SEND-OUT TO REF LAB; TAT 18-24 HRS): SARS-CoV-2, NAA: NOT DETECTED

## 2019-05-21 DIAGNOSIS — F411 Generalized anxiety disorder: Secondary | ICD-10-CM | POA: Diagnosis not present

## 2019-05-21 DIAGNOSIS — R1110 Vomiting, unspecified: Secondary | ICD-10-CM | POA: Diagnosis not present

## 2019-05-21 DIAGNOSIS — U071 COVID-19: Secondary | ICD-10-CM | POA: Diagnosis not present

## 2019-05-21 DIAGNOSIS — R110 Nausea: Secondary | ICD-10-CM | POA: Diagnosis not present

## 2019-05-21 DIAGNOSIS — R52 Pain, unspecified: Secondary | ICD-10-CM | POA: Diagnosis not present

## 2019-05-21 DIAGNOSIS — G4700 Insomnia, unspecified: Secondary | ICD-10-CM | POA: Diagnosis not present

## 2019-05-21 DIAGNOSIS — I10 Essential (primary) hypertension: Secondary | ICD-10-CM | POA: Diagnosis not present

## 2019-05-21 DIAGNOSIS — M5432 Sciatica, left side: Secondary | ICD-10-CM | POA: Diagnosis not present

## 2019-05-21 DIAGNOSIS — R519 Headache, unspecified: Secondary | ICD-10-CM | POA: Diagnosis not present

## 2019-05-30 DIAGNOSIS — Z23 Encounter for immunization: Secondary | ICD-10-CM | POA: Diagnosis not present

## 2019-06-04 DIAGNOSIS — U071 COVID-19: Secondary | ICD-10-CM | POA: Diagnosis not present

## 2019-06-04 DIAGNOSIS — R110 Nausea: Secondary | ICD-10-CM | POA: Diagnosis not present

## 2019-06-04 DIAGNOSIS — R7301 Impaired fasting glucose: Secondary | ICD-10-CM | POA: Diagnosis not present

## 2019-06-04 DIAGNOSIS — M5432 Sciatica, left side: Secondary | ICD-10-CM | POA: Diagnosis not present

## 2019-06-04 DIAGNOSIS — F39 Unspecified mood [affective] disorder: Secondary | ICD-10-CM | POA: Diagnosis not present

## 2019-06-04 DIAGNOSIS — M791 Myalgia, unspecified site: Secondary | ICD-10-CM | POA: Diagnosis not present

## 2019-06-04 DIAGNOSIS — F411 Generalized anxiety disorder: Secondary | ICD-10-CM | POA: Diagnosis not present

## 2019-06-04 DIAGNOSIS — I10 Essential (primary) hypertension: Secondary | ICD-10-CM | POA: Diagnosis not present

## 2019-06-04 DIAGNOSIS — E559 Vitamin D deficiency, unspecified: Secondary | ICD-10-CM | POA: Diagnosis not present

## 2019-06-28 DIAGNOSIS — Z23 Encounter for immunization: Secondary | ICD-10-CM | POA: Diagnosis not present

## 2019-07-09 DIAGNOSIS — M5432 Sciatica, left side: Secondary | ICD-10-CM | POA: Diagnosis not present

## 2019-07-09 DIAGNOSIS — R110 Nausea: Secondary | ICD-10-CM | POA: Diagnosis not present

## 2019-07-09 DIAGNOSIS — F39 Unspecified mood [affective] disorder: Secondary | ICD-10-CM | POA: Diagnosis not present

## 2019-07-09 DIAGNOSIS — F411 Generalized anxiety disorder: Secondary | ICD-10-CM | POA: Diagnosis not present

## 2019-08-28 DIAGNOSIS — M5432 Sciatica, left side: Secondary | ICD-10-CM | POA: Diagnosis not present

## 2019-08-28 DIAGNOSIS — R110 Nausea: Secondary | ICD-10-CM | POA: Diagnosis not present

## 2019-08-28 DIAGNOSIS — F3181 Bipolar II disorder: Secondary | ICD-10-CM | POA: Diagnosis not present

## 2019-08-28 DIAGNOSIS — F411 Generalized anxiety disorder: Secondary | ICD-10-CM | POA: Diagnosis not present

## 2019-09-04 DIAGNOSIS — R110 Nausea: Secondary | ICD-10-CM | POA: Diagnosis not present

## 2019-09-04 DIAGNOSIS — M5432 Sciatica, left side: Secondary | ICD-10-CM | POA: Diagnosis not present

## 2019-09-04 DIAGNOSIS — F411 Generalized anxiety disorder: Secondary | ICD-10-CM | POA: Diagnosis not present

## 2019-09-04 DIAGNOSIS — F3181 Bipolar II disorder: Secondary | ICD-10-CM | POA: Diagnosis not present

## 2019-12-18 DIAGNOSIS — F411 Generalized anxiety disorder: Secondary | ICD-10-CM | POA: Diagnosis not present

## 2019-12-18 DIAGNOSIS — G4700 Insomnia, unspecified: Secondary | ICD-10-CM | POA: Diagnosis not present

## 2019-12-18 DIAGNOSIS — M5432 Sciatica, left side: Secondary | ICD-10-CM | POA: Diagnosis not present

## 2019-12-18 DIAGNOSIS — R110 Nausea: Secondary | ICD-10-CM | POA: Diagnosis not present

## 2019-12-25 DIAGNOSIS — Z1159 Encounter for screening for other viral diseases: Secondary | ICD-10-CM | POA: Diagnosis not present

## 2020-02-27 DIAGNOSIS — Z23 Encounter for immunization: Secondary | ICD-10-CM | POA: Diagnosis not present

## 2021-02-08 ENCOUNTER — Emergency Department (HOSPITAL_COMMUNITY): Payer: Medicare Other

## 2021-02-08 ENCOUNTER — Emergency Department (HOSPITAL_COMMUNITY)
Admission: EM | Admit: 2021-02-08 | Discharge: 2021-02-09 | Disposition: A | Discharge status: Home / Self Care | Payer: Medicare Other | Attending: Emergency Medicine | Admitting: Emergency Medicine

## 2021-02-08 ENCOUNTER — Other Ambulatory Visit: Payer: Self-pay

## 2021-02-08 DIAGNOSIS — R1031 Right lower quadrant pain: Secondary | ICD-10-CM | POA: Diagnosis present

## 2021-02-08 DIAGNOSIS — K529 Noninfective gastroenteritis and colitis, unspecified: Secondary | ICD-10-CM | POA: Insufficient documentation

## 2021-02-08 DIAGNOSIS — I10 Essential (primary) hypertension: Secondary | ICD-10-CM | POA: Diagnosis not present

## 2021-02-08 DIAGNOSIS — Z87891 Personal history of nicotine dependence: Secondary | ICD-10-CM | POA: Diagnosis not present

## 2021-02-08 LAB — COMPREHENSIVE METABOLIC PANEL
ALT: 15 U/L (ref 0–44)
AST: 21 U/L (ref 15–41)
Albumin: 3.9 g/dL (ref 3.5–5.0)
Alkaline Phosphatase: 68 U/L (ref 38–126)
Anion gap: 9 (ref 5–15)
BUN: 14 mg/dL (ref 8–23)
CO2: 28 mmol/L (ref 22–32)
Calcium: 9.1 mg/dL (ref 8.9–10.3)
Chloride: 101 mmol/L (ref 98–111)
Creatinine, Ser: 0.87 mg/dL (ref 0.44–1.00)
GFR, Estimated: >60 mL/min (ref >60)
Glucose, Bld: 109 mg/dL — ABNORMAL HIGH (ref 70–99)
Potassium: 4.1 mmol/L (ref 3.5–5.1)
Sodium: 138 mmol/L (ref 135–145)
Total Bilirubin: 0.7 mg/dL (ref 0.3–1.2)
Total Protein: 6.6 g/dL (ref 6.5–8.1)

## 2021-02-08 LAB — CBC WITH DIFFERENTIAL/PLATELET
Abs Immature Granulocytes: 0.02 10*3/uL (ref 0.00–0.07)
Basophils Absolute: 0 10*3/uL (ref 0.0–0.1)
Basophils Relative: 1 %
Eosinophils Absolute: 0 10*3/uL (ref 0.0–0.5)
Eosinophils Relative: 0 %
HCT: 38.7 % (ref 36.0–46.0)
Hemoglobin: 12.1 g/dL (ref 12.0–15.0)
Immature Granulocytes: 0 %
Lymphocytes Relative: 8 %
Lymphs Abs: 0.6 10*3/uL — ABNORMAL LOW (ref 0.7–4.0)
MCH: 29.4 pg (ref 26.0–34.0)
MCHC: 31.3 g/dL (ref 30.0–36.0)
MCV: 93.9 fL (ref 80.0–100.0)
Monocytes Absolute: 0.5 10*3/uL (ref 0.1–1.0)
Monocytes Relative: 7 %
Neutro Abs: 6.3 10*3/uL (ref 1.7–7.7)
Neutrophils Relative %: 84 %
Platelets: 270 10*3/uL (ref 150–400)
RBC: 4.12 MIL/uL (ref 3.87–5.11)
RDW: 12.7 % (ref 11.5–15.5)
WBC: 7.5 10*3/uL (ref 4.0–10.5)
nRBC: 0 % (ref 0.0–0.2)

## 2021-02-08 LAB — LIPASE, BLOOD: Lipase: 28 U/L (ref 11–51)

## 2021-02-08 LAB — URINALYSIS, ROUTINE W REFLEX MICROSCOPIC
Bilirubin Urine: NEGATIVE
Glucose, UA: NEGATIVE mg/dL
Hgb urine dipstick: NEGATIVE
Ketones, ur: 5 mg/dL — AB
Nitrite: NEGATIVE
Protein, ur: NEGATIVE mg/dL
Specific Gravity, Urine: 1.02 (ref 1.005–1.030)
pH: 6 (ref 5.0–8.0)

## 2021-02-08 MED ORDER — IOHEXOL 300 MG/ML  SOLN
100.0000 mL | Freq: Once | INTRAMUSCULAR | Status: AC | PRN
  Administered 2021-02-08: 100 mL via INTRAVENOUS

## 2021-02-08 MED ORDER — OXYCODONE-ACETAMINOPHEN 5-325 MG PO TABS
1.0000 | ORAL_TABLET | Freq: Once | ORAL | Status: AC
  Administered 2021-02-08: 1 via ORAL
  Filled 2021-02-08: qty 1

## 2021-02-08 MED ORDER — ONDANSETRON 4 MG PO TBDP
8.0000 mg | ORAL_TABLET | Freq: Once | ORAL | Status: AC
  Administered 2021-02-08: 8 mg via ORAL
  Filled 2021-02-08: qty 2

## 2021-02-08 NOTE — ED Provider Notes (Signed)
Emergency Medicine Provider Triage Evaluation Note  Vickie Holloway , a 75 y.o. female  was evaluated in triage.  Pt complains of right lower quadrant pain.  Started yesterday periumbilically, localized to the right lower side.  The pain is constant, associated with nausea but no vomiting.  Had a fever yesterday of 101.9.  No prior abdominal surgeries other than hysterectomy..  Patient has not been as not on any blood thinners, last meal was early this morning around 6 AM.   Review of Systems  Positive: Right lower quadrant abdominal pain, nausea Negative: Vomiting   Physical Exam  BP 122/62 (BP Location: Left Arm)   Pulse 78   Temp 97.9 F (36.6 C)   Resp 18   SpO2 99%  Gen:   Awake, no distress   Resp:  Normal effort  MSK:   Moves extremities without difficulty  Other:  Right lower quadrant tenderness, rebound tenderness.  Medical Decision Making  Medically screening exam initiated at 6:02 PM.  Appropriate orders placed.  Vickie Holloway was informed that the remainder of the evaluation will be completed by another provider, this initial triage assessment does not replace that evaluation, and the importance of remaining in the ED until their evaluation is complete.  CT scan for appendicitis.   Sherrill Raring, PA-C 02/08/21 1805    Regan Lemming, MD 02/08/21 [phone]

## 2021-02-08 NOTE — ED Triage Notes (Signed)
Pt here d/t right side abdominal pain X1 day. Pt endorses Nausea and fever.

## 2021-02-09 ENCOUNTER — Telehealth: Payer: Self-pay | Admitting: Internal Medicine

## 2021-02-09 DIAGNOSIS — K529 Noninfective gastroenteritis and colitis, unspecified: Secondary | ICD-10-CM | POA: Diagnosis not present

## 2021-02-09 LAB — URINE CULTURE: Culture: NO GROWTH

## 2021-02-09 MED ORDER — PIPERACILLIN-TAZOBACTAM 3.375 G IVPB 30 MIN
3.3750 g | Freq: Once | INTRAVENOUS | Status: AC
  Administered 2021-02-09: 3.375 g via INTRAVENOUS
  Filled 2021-02-09: qty 50

## 2021-02-09 MED ORDER — ONDANSETRON HCL 4 MG/2ML IJ SOLN
4.0000 mg | Freq: Once | INTRAMUSCULAR | Status: AC
  Administered 2021-02-09: 4 mg via INTRAVENOUS
  Filled 2021-02-09: qty 2

## 2021-02-09 MED ORDER — AMOXICILLIN-POT CLAVULANATE 875-125 MG PO TABS: 1.0000 | ORAL_TABLET | Freq: Two times a day (BID) | ORAL | 0 refills | Status: DC

## 2021-02-09 MED ORDER — ONDANSETRON 4 MG PO TBDP: 4.0000 mg | ORAL_TABLET | Freq: Three times a day (TID) | ORAL | 0 refills | Status: DC | PRN

## 2021-02-09 MED ORDER — SODIUM CHLORIDE 0.9 % IV BOLUS
1000.0000 mL | Freq: Once | INTRAVENOUS | Status: AC
  Administered 2021-02-09: 1000 mL via INTRAVENOUS

## 2021-02-09 NOTE — Telephone Encounter (Signed)
Noted and thanks.

## 2021-02-09 NOTE — Telephone Encounter (Signed)
Call taken last night  Patient has CT that raises ? Of colon cancer vs infection  Was started on antibiotics  Needs an appt any provider 1-2 weeks please  Let me know what you come up with and if no opening use an 1130 or 350 or banding spot of mine

## 2021-02-09 NOTE — Discharge Instructions (Signed)
Take the antibiotics and nausea medications as prescribed.  As we discussed, your CT scan results are concerning for possibility of malignancy.  This is not definite but needs further evaluation by the gastroenterologist ED with either repeat CT scan or colonoscopy or both.  You should hear from the GI office this week to be scheduled.  If you do not hear from them by Friday you should call to make an appointment.  Return to the ED with worsening abdominal pain, fever, vomiting, other concerns

## 2021-02-09 NOTE — ED Notes (Signed)
Pt ambulated independently to restroom

## 2021-02-09 NOTE — Telephone Encounter (Signed)
Pt notified of her appointment that has been scheduled for 02/18/2021 @ 1:30 to see Ellouise Newer Pt verbalized understanding with all questions answered.

## 2021-02-09 NOTE — ED Notes (Signed)
Patient verbalizes understanding of d/c instructions. Opportunities for questions and answers were provided. Pt d/c from ED and ambulated to lobby with husband.  

## 2021-02-09 NOTE — ED Notes (Signed)
Pt given water at bedside.

## 2021-02-09 NOTE — ED Provider Notes (Signed)
Vickie Holloway Provider Note   CSN: [id number] Arrival date & time: 02/08/21  1726     History Chief Complaint  Patient presents with   Abdominal Pain    Vickie Holloway is a 75 y.o. female.  Patient presents with right-sided lower abdominal pain for the past 2 days progressively worsening.  Started periumbilically and has since moved to her right lower quadrant.  Her pain is constant somewhat relieved with Aleve she took at home.  She had a fever of 102 yesterday but none today.  Nausea but no vomiting.  She states she has had nausea ongoing for several months.  No pain with urination or blood in the urine.  Last bowel movement was yesterday and normal.  Reduced appetite today. No vaginal bleeding or vaginal discharge. Still has appendix and gallbladder.  Still has 1 ovary she is not certain which one.  She has never had a colonoscopy.  The history is provided by the spouse and the patient.  Abdominal Pain Associated symptoms: fever and nausea   Associated symptoms: no chest pain, no constipation, no cough, no dysuria, no hematuria, no shortness of breath, no vaginal bleeding, no vaginal discharge and no vomiting       Past Medical History:  Diagnosis Date   Allergy    Anxiety    Arthritis    Hypertension     There are no problems to display for this patient.   Past Surgical History:  Procedure Laterality Date   ABDOMINAL HYSTERECTOMY  1985   COSMETIC SURGERY  1999   face lift   DILATION AND CURETTAGE OF UTERUS     ORIF WRIST FRACTURE Left 01/01/2013   Procedure: OPEN REDUCTION INTERNAL FIXATION (ORIF)OF LEFT DISTAL RADIUS FRACTURE;  Surgeon: Cammie Sickle., MD;  Location: [facility];  Service: Orthopedics;  Laterality: Left;   REFRACTIVE SURGERY     TONSILLECTOMY       OB History   No obstetric history on file.     Family History  Problem Relation Age of Onset   Stroke Mother     Social History    Tobacco Use   Smoking status: Former    Types: Cigarettes    Quit date: 01/01/1979    Years since quitting: 42.1   Smokeless tobacco: Former  Substance Use Topics   Alcohol use: Yes    Comment: occ   Drug use: No    Home Medications Prior to Admission medications   Medication Sig Start Date End Date Taking? Authorizing Provider  calcium carbonate (OS-CAL - DOSED IN MG OF ELEMENTAL CALCIUM) 1250 MG tablet Take 1 tablet by mouth.    [provider]  cyclobenzaprine (FLEXERIL) 10 MG tablet Take 1 tablet (10 mg total) by mouth 2 (two) times daily as needed for muscle spasms. 10/27/17   Wieters, Hallie C, PA-C  ibuprofen (ADVIL,MOTRIN) 800 MG tablet Take 1 tablet (800 mg total) by mouth 3 (three) times daily. 10/02/17   Raylene Everts, MD  Multiple Vitamins-Minerals (MULTIVITAMIN WITH MINERALS) tablet Take 1 tablet by mouth daily.    [provider]  NON FORMULARY Patient says there is another med that she takes at night, new medicine and cannot remember the name of it    [provider]  ondansetron (ZOFRAN-ODT) 4 MG disintegrating tablet Take 1 tablet (4 mg total) by mouth every 8 (eight) hours as needed for nausea or vomiting. 02/08/19   Zigmund Gottron, NP  sertraline (ZOLOFT) 50 MG tablet Take 1 tablet (50 mg total) by mouth daily. 09/27/12   Le, Thao P, DO  TRAZODONE HCL PO Take by mouth.    [provider]  vitamin C (ASCORBIC ACID) 500 MG tablet Take 500 mg by mouth daily.    [provider]    Allergies    Patient has no known allergies.  Review of Systems   Review of Systems  Constitutional:  Positive for activity change, appetite change and fever.  HENT:  Negative for congestion and rhinorrhea.   Respiratory:  Negative for cough, chest tightness and shortness of breath.   Cardiovascular:  Negative for chest pain.  Gastrointestinal:  Positive for abdominal pain and nausea. Negative for constipation and vomiting.   Genitourinary:  Negative for dysuria, hematuria, vaginal bleeding and vaginal discharge.  Musculoskeletal:  Negative for arthralgias, back pain and myalgias.  Skin:  Negative for rash.  Neurological:  Negative for dizziness, weakness and headaches.   all other systems are negative except as noted in the HPI and PMH.   Physical Exam Updated Vital Signs BP (!) 145/67 (BP Location: Right Arm)   Pulse 67   Temp 97.6 F (36.4 C) (Oral)   Resp 18   SpO2 100%   Physical Exam Vitals and nursing note reviewed.  Constitutional:      General: She is not in acute distress.    Appearance: She is well-developed. She is not ill-appearing.  HENT:     Head: Normocephalic and atraumatic.     Mouth/Throat:     Pharynx: No oropharyngeal exudate.  Eyes:     Conjunctiva/sclera: Conjunctivae normal.     Pupils: Pupils are equal, round, and reactive to light.  Neck:     Comments: No meningismus. Cardiovascular:     Rate and Rhythm: Normal rate and regular rhythm.     Heart sounds: Normal heart sounds. No murmur heard. Pulmonary:     Effort: Pulmonary effort is normal. No respiratory distress.     Breath sounds: Normal breath sounds.  Chest:     Chest wall: No tenderness.  Abdominal:     Palpations: Abdomen is soft.     Tenderness: There is abdominal tenderness. There is no guarding or rebound.     Comments: TTP RLQ. No guarding or rebound  Musculoskeletal:        General: No tenderness. Normal range of motion.     Cervical back: Normal range of motion and neck supple.     Comments: No CVAT  Skin:    General: Skin is warm.  Neurological:     Mental Status: She is alert and oriented to person, place, and time.     Cranial Nerves: No cranial nerve deficit.     Motor: No abnormal muscle tone.     Coordination: Coordination normal.     Comments:  5/5 strength throughout. CN 2-12 intact.Equal grip strength.   Psychiatric:        Behavior: Behavior normal.    ED Results / Procedures /  Treatments   Labs (all labs ordered are listed, but only abnormal results are displayed) Labs Reviewed  CBC WITH DIFFERENTIAL/PLATELET - Abnormal; Notable for the following components:      Result Value   Lymphs Abs 0.6 (*)    All other components within normal limits  COMPREHENSIVE METABOLIC PANEL - Abnormal; Notable for the following components:   Glucose, Bld 109 (*)    All other components within normal limits  URINALYSIS,  ROUTINE W REFLEX MICROSCOPIC - Abnormal; Notable for the following components:   APPearance HAZY (*)    Ketones, ur 5 (*)    Leukocytes,Ua TRACE (*)    Bacteria, UA RARE (*)    All other components within normal limits  LIPASE, BLOOD    EKG None  Radiology CT ABDOMEN PELVIS W CONTRAST  Result Date: 02/08/2021 CLINICAL DATA:  Right lower quadrant abdominal pain. Nausea and fever. EXAM: CT ABDOMEN AND PELVIS WITH CONTRAST TECHNIQUE: Multidetector CT imaging of the abdomen and pelvis was performed using the standard protocol following bolus administration of intravenous contrast. CONTRAST:  126mL OMNIPAQUE IOHEXOL 300 MG/ML  SOLN COMPARISON:  None. FINDINGS: Lower chest: No acute abnormality.  Tiny hiatal hernia. Hepatobiliary: No focal liver abnormality. No gallstones, gallbladder wall thickening, or pericholecystic fluid. No biliary dilatation. Pancreas: No focal lesion. Normal pancreatic contour. No surrounding inflammatory changes. No main pancreatic ductal dilatation. Spleen: Normal in size without focal abnormality. Adrenals/Urinary Tract: No adrenal nodule bilaterally. Bilateral kidneys enhance symmetrically. 2 mm calcified stone within the left kidney. Subcentimeter hypodensities too small characterize. No hydronephrosis. No hydroureter. The urinary bladder is unremarkable. On delayed imaging, there is no urothelial wall thickening and there are no filling defects in the opacified portions of the bilateral collecting systems or ureters. Stomach/Bowel: Stomach is  within normal limits. No evidence of small bowel wall thickening or dilatation. Irregular circumferential bowel wall thickening of the distal ascending colon (7:42). Associated trace pericolonic fat stranding. Appendix appears normal. Vascular/Lymphatic: No abdominal aorta or iliac aneurysm. Mild atherosclerotic plaque of the aorta and its branches. Prominent but nonenlarged right pericolic lymph nodes. No abdominal, pelvic, or inguinal lymphadenopathy. Reproductive: Status post hysterectomy. No adnexal masses. Other: No intraperitoneal free fluid. No intraperitoneal free gas. No organized fluid collection. Musculoskeletal: No abdominal wall hernia or abnormality. No suspicious lytic or blastic osseous lesions. No acute displaced fracture. Grade 1 anterolisthesis of L4 on L5. IMPRESSION: 1. Distal ascending colon with associated pericolonic fat stranding and prominent but nonenlarged mesenteric lymph nodes. Findings suggestive of malignancy. Differential diagnosis of infection. Recommend colonoscopy after course of infection treatment to exclude malignancy. 2. Tiny hiatal hernia. 3. Nonobstructive punctate left nephrolithiasis. Electronically Signed   By: Iven Finn M.D.   On: 02/08/2021 23:03    Procedures Procedures   Medications Ordered in ED Medications  piperacillin-tazobactam (ZOSYN) IVPB 3.375 g (has no administration in time range)  ondansetron (ZOFRAN) injection 4 mg (has no administration in time range)  sodium chloride 0.9 % bolus 1,000 mL (has no administration in time range)  oxyCODONE-acetaminophen (PERCOCET/ROXICET) 5-325 MG per tablet 1 tablet (1 tablet Oral Given 02/08/21 1909)  ondansetron (ZOFRAN-ODT) disintegrating tablet 8 mg (8 mg Oral Given 02/08/21 1909)  iohexol (OMNIPAQUE) 300 MG/ML solution 100 mL (100 mLs Intravenous Contrast Given 02/08/21 2217)    ED Course  I have reviewed the triage vital signs and the nursing notes.  Pertinent labs & imaging results that were  available during my care of the patient were reviewed by me and considered in my medical decision making (see chart for details).    MDM Rules/Calculators/A&P                          Right-sided abdominal pain with nausea and fever since yesterday.  No fever today.  Vital stable.  Abdomen soft without peritoneal signs.  Labs reassuring.  Pyuria without infection.  Will send culture.  No leukocytosis.  CT scan does  show normal appendix but concerning inflammation of ascending colon. Possibility of infection with possibility of malignancy with patient and husband.  She has never had a colonoscopy.  Admission versus discharge discussed with patient.  She would prefer to go home.  Discussed with Dr. Carlean Purl of gastroenterology.  He states office will arrange for follow-up with patient after her infection is treated.  He agrees if her vitals are stable and she is tolerating p.o. and she looks clinically well, she can be discharged for outpatient follow-up and oral antibiotics.  He agrees that colonoscopy would not be performed emergently and will need to wait until her infection is treated.  Patient and husband would like to go home.  She states her pain and nausea are controlled.  She understands importance of follow-up with gastroenterology as well as her PCP.  We will give course of antibiotics.  Discussed she will likely need a repeat CT scan versus colonoscopy after she completes these antibiotics. Return to the ED with worsening pain, fever, vomiting, other concerns. She is instructed to call the gastroenterology office if she does not hear from them in the next 2 days.  She understands the importance of her CT scan findings. Final Clinical Impression(s) / ED Diagnoses Final diagnoses:  Right lower quadrant abdominal pain  Colitis    Rx / DC Orders ED Discharge Orders     None        Brenin Heidelberger, Annie Main, MD 02/09/21 [phone]

## 2021-02-09 NOTE — Telephone Encounter (Signed)
Pt has been scheduled for 02/18/2021 @ 1:30 with Ellouise Newer PA Left message for pt to call back

## 2021-02-18 ENCOUNTER — Ambulatory Visit (INDEPENDENT_AMBULATORY_CARE_PROVIDER_SITE_OTHER): Payer: Medicare Other | Admitting: Physician Assistant

## 2021-02-18 ENCOUNTER — Encounter: Payer: Self-pay | Admitting: Physician Assistant

## 2021-02-18 VITALS — BP 124/88 | HR 84 | Ht 60.0 in | Wt 120.0 lb

## 2021-02-18 DIAGNOSIS — R935 Abnormal findings on diagnostic imaging of other abdominal regions, including retroperitoneum: Secondary | ICD-10-CM | POA: Diagnosis not present

## 2021-02-18 MED ORDER — PLENVU 140 G PO SOLR: 1.0000 | ORAL | 0 refills | Status: DC

## 2021-02-18 NOTE — Patient Instructions (Signed)
If you are age 76 or older, your body mass index should be between 23-30. Your Body mass index is 23.44 kg/m. If this is out of the aforementioned range listed, please consider follow up with your Primary Care Provider. ________________________________________________________  The [facility] providers would like to encourage you to use [facility] to communicate with providers for non-urgent requests or questions.  Due to long hold times on the telephone, sending your provider a message by [facility] may be a faster and more efficient way to get a response.  Please allow 48 business hours for a response.  Please remember that this is for non-urgent requests.  _______________________________________________________  Vickie Holloway have been scheduled for a colonoscopy. Please follow written instructions given to you at your visit today.  Please pick up your prep supplies at the pharmacy within the next 1-3 days. If you use inhalers (even only as needed), please bring them with you on the day of your procedure.  Follow up pending  Thank you for entrusting me with your care and choosing [facility].  Ellouise Newer, PA-C

## 2021-02-18 NOTE — Progress Notes (Signed)
Chief Complaint: Follow-up abnormal CT scan of the abdomen  HPI:    Mrs. Vickie Holloway is a 75 year old Caucasian female with a past medical history as listed below, who was referred to me by [name], MD for follow-up after being seen in the ER for right lower quadrant pain and having an abnormal CT scan.    02/08/2021 patient seen in the ER for 2 days of progressively worsening right lower quadrant pain and a fever of 102 at 1 point.  At that time I had a mostly normal CBC and CMP as well as lipase.  CT the abdomen pelvis with contrast showed distal ascending colon with associated pericolonic fat stranding and prominent but nonenlarged mesenteric lymph nodes, findings suggestive of malignancy.  Differential diagnosis of infection.  Recommended colonoscopy after course of antibiotics to exclude malignancy, Tydemy tiny hiatal hernia and nonobstructive punctate left nephrolithiasis.    Today, the patient presents clinic companied by her husband who assists with history.  She explains that she started with right lower quadrant pain which "was not even that bad, but was nagging" and had a fever for a little bit, so she went to the ER.  There she had evaluation  with CT showing infection versus cancer.  She was started on antibiotics and has 1 day left.  Tells me the pain has gotten some better.  Does explain that she has never had screening for colon cancer including Cologuard or Hemoccult testing.  She is worried about the possibility of cancer.  In further questioning does describe some occasional nausea off-and-on over the past year which she feels is related to a hiatal hernia and some postnasal drip.  Also describes intermittent constipation with balls of stool, but blames this on her magnesium which she takes twice daily.  Also describes some right lower quadrant tenderness off-and-on over the the past year or so when she is out walking.    Family member sees Dr. Carlean Purl    Denies weight loss,  vomiting or symptoms that awaken her from sleep.  Past Medical History:  Diagnosis Date   Allergy    Anxiety    Arthritis    Hypertension     Past Surgical History:  Procedure Laterality Date   ABDOMINAL HYSTERECTOMY  1985   COSMETIC SURGERY  1999   face lift   DILATION AND CURETTAGE OF UTERUS     ORIF WRIST FRACTURE Left 01/01/2013   Procedure: OPEN REDUCTION INTERNAL FIXATION (ORIF)OF LEFT DISTAL RADIUS FRACTURE;  Surgeon: Cammie Sickle., MD;  Location: [facility];  Service: Orthopedics;  Laterality: Left;   REFRACTIVE SURGERY     TONSILLECTOMY      Current Outpatient Medications  Medication Sig Dispense Refill   amoxicillin-clavulanate (AUGMENTIN) 875-125 MG tablet Take 1 tablet by mouth every 12 (twelve) hours. 20 tablet 0   Magnesium 400 MG TABS Take by mouth in the morning and at bedtime.     Multiple Vitamins-Minerals (HAIR SKIN AND NAILS FORMULA PO) Take by mouth.     Multiple Vitamins-Minerals (ZINC PO) Take by mouth.     olmesartan (BENICAR) 20 MG tablet Take 20 mg by mouth daily.     sertraline (ZOLOFT) 50 MG tablet Take 1 tablet (50 mg total) by mouth daily. 30 tablet 0   VITAMIN D PO Take by mouth.     TRAZODONE HCL PO Take by mouth. (Patient not taking: Reported on 02/18/2021)     vitamin C (ASCORBIC ACID) 500 MG  tablet Take 1,000 mg by mouth daily.     No current facility-administered medications for this visit.    Allergies as of 02/18/2021   (No Known Allergies)    Family History  Problem Relation Age of Onset   Stroke Mother    Heart disease Mother    Pulmonary fibrosis Father    Colon cancer Neg Hx    Stomach cancer Neg Hx    Esophageal cancer Neg Hx    Pancreatic cancer Neg Hx     Social History   Socioeconomic History   Marital status: Married    Spouse name: Not on file   Number of children: Not on file   Years of education: Not on file   Highest education level: Not on file  Occupational History   Not on file   Tobacco Use   Smoking status: Former    Types: Cigarettes    Quit date: 01/01/1979    Years since quitting: 42.1   Smokeless tobacco: Former  Scientific laboratory technician Use: Former  Substance and Sexual Activity   Alcohol use: Yes    Comment: occ   Drug use: No   Sexual activity: Not on file  Other Topics Concern   Not on file  Social History Narrative   Not on file   Social Determinants of Health   Financial Resource Strain: Not on file  Food Insecurity: Not on file  Transportation Needs: Not on file  Physical Activity: Not on file  Stress: Not on file  Social Connections: Not on file  Intimate Partner Violence: Not on file    Review of Systems:    Constitutional: No weight loss, fever or chills Skin: No rash Cardiovascular: No chest pain Respiratory: No SOB  Gastrointestinal: See HPI and otherwise negative Genitourinary: No dysuria Neurological: No headache, dizziness or syncope Musculoskeletal: No new muscle or joint pain Hematologic: No bleeding  Psychiatric: No history of depression or anxiety   Physical Exam:  Vital signs: BP 124/88   Pulse 84   Ht 5' (1.524 m)   Wt 120 lb (54.4 kg)   SpO2 99%   BMI 23.44 kg/m   Constitutional:   Pleasant Elderly Caucasian female appears to be in NAD, Well developed, Well nourished, alert and cooperative Head:  Normocephalic and atraumatic. Eyes:   PEERL, EOMI. No icterus. Conjunctiva pink. Ears:  Normal auditory acuity. Neck:  Supple Throat: Oral cavity and pharynx without inflammation, swelling or lesion.  Respiratory: Respirations even and unlabored. Lungs clear to auscultation bilaterally.   No wheezes, crackles, or rhonchi.  Cardiovascular: Normal S1, S2. No MRG. Regular rate and rhythm. No peripheral edema, cyanosis or pallor.  Gastrointestinal:  Soft, nondistended, nontender. No rebound or guarding. Normal bowel sounds. No appreciable masses or hepatomegaly. Rectal:  Not performed.  Msk:  Symmetrical without gross  deformities. Without edema, no deformity or joint abnormality.  Neurologic:  Alert and  oriented x4;  grossly normal neurologically.  Skin:   Dry and intact without significant lesions or rashes. Psychiatric: Demonstrates good judgement and reason without abnormal affect or behaviors.  RELEVANT LABS AND IMAGING: CBC    Component Value Date/Time   WBC 7.5 02/08/2021 1803   RBC 4.12 02/08/2021 1803   HGB 12.1 02/08/2021 1803   HCT 38.7 02/08/2021 1803   PLT 270 02/08/2021 1803   MCV 93.9 02/08/2021 1803   MCV 96.4 09/03/2012 1620   MCH 29.4 02/08/2021 1803   MCHC 31.3 02/08/2021 1803   RDW 12.7  02/08/2021 1803   LYMPHSABS 0.6 (L) 02/08/2021 1803   MONOABS 0.5 02/08/2021 1803   EOSABS 0.0 02/08/2021 1803   BASOSABS 0.0 02/08/2021 1803    CMP     Component Value Date/Time   NA 138 02/08/2021 1803   K 4.1 02/08/2021 1803   CL 101 02/08/2021 1803   CO2 28 02/08/2021 1803   GLUCOSE 109 (H) 02/08/2021 1803   BUN 14 02/08/2021 1803   CREATININE 0.87 02/08/2021 1803   CREATININE 0.68 09/03/2012 1606   CALCIUM 9.1 02/08/2021 1803   PROT 6.6 02/08/2021 1803   ALBUMIN 3.9 02/08/2021 1803   AST 21 02/08/2021 1803   ALT 15 02/08/2021 1803   ALKPHOS 68 02/08/2021 1803   BILITOT 0.7 02/08/2021 1803   GFRNONAA >60 02/08/2021 1803    Assessment: 1.  Abnormal CT of the abdomen: Showing infection versus malignancy, no prior colonoscopy, finishing a 12-day course of antibiotics from the ER tomorrow; patient needs colonoscopy  Plan: 1.  Discussed case with Dr. Silverio Decamp as Dr. Carlean Purl was not in clinic.  She recommended a colonoscopy soon regardless of patient just having finished antibiotics.  Patient was scheduled with Dr. Silverio Decamp for colonoscopy on Wednesday, 02/24/2021 as Dr. Carlean Purl did not have any sooner availability.  Patient would like to follow with Dr. Carlean Purl going forward as her primary GI physician since he sees her family members. 2.  Patient to finish current antibiotic  course. 3.  Patient to follow in clinic per recommendations after time of procedure.  Ellouise Newer, PA-C [facility] 02/18/2021, 2:01 PM  Cc: [name], MD

## 2021-02-22 ENCOUNTER — Telehealth: Payer: Self-pay | Admitting: Gastroenterology

## 2021-02-22 NOTE — Telephone Encounter (Signed)
Patient has eaten a salad with walnuts and raisins, raw tomatoes and grapes within the last 2 days. She is scheduled for her colonoscopy on Wednesday 02/24/21. Do we need to reschedule?

## 2021-02-22 NOTE — Telephone Encounter (Signed)
Patient instructed. Skip the Dulcolax. Gatorade and Miralax prep. Follow the instructions for the rest of the prep as written on her instructions. Stressed she push fluids to help the prep work.

## 2021-02-22 NOTE — Telephone Encounter (Signed)
Called both listed phone numbers to speak with the patient. Left a message on her voicemail and sent her a message through My Chart. What did she eat, how much and when?

## 2021-02-22 NOTE — Telephone Encounter (Signed)
We can do extended bowel prep with Miralax for 2 days or reschedule based on patient preference. Thanks

## 2021-02-22 NOTE — Telephone Encounter (Signed)
Left message on the patient's voicemail. Brief instructions for clear liquid, Miralax purge (7 doses in 32 oz of Gatorade) and 2 dulcolax tablets.

## 2021-02-22 NOTE — Telephone Encounter (Signed)
Patient states that she has eaten nuts and popcorn, seeking advise.

## 2021-02-24 ENCOUNTER — Encounter: Payer: Self-pay | Admitting: Gastroenterology

## 2021-02-24 ENCOUNTER — Ambulatory Visit (AMBULATORY_SURGERY_CENTER): Payer: Medicare Other | Admitting: Gastroenterology

## 2021-02-24 VITALS — BP 143/76 | HR 81 | Temp 98.0°F | Resp 20 | Ht 60.0 in | Wt 120.0 lb

## 2021-02-24 DIAGNOSIS — C182 Malignant neoplasm of ascending colon: Secondary | ICD-10-CM

## 2021-02-24 DIAGNOSIS — K6389 Other specified diseases of intestine: Secondary | ICD-10-CM

## 2021-02-24 DIAGNOSIS — R935 Abnormal findings on diagnostic imaging of other abdominal regions, including retroperitoneum: Secondary | ICD-10-CM

## 2021-02-24 DIAGNOSIS — K648 Other hemorrhoids: Secondary | ICD-10-CM

## 2021-02-24 DIAGNOSIS — D120 Benign neoplasm of cecum: Secondary | ICD-10-CM

## 2021-02-24 DIAGNOSIS — K5730 Diverticulosis of large intestine without perforation or abscess without bleeding: Secondary | ICD-10-CM | POA: Diagnosis not present

## 2021-02-24 MED ORDER — SODIUM CHLORIDE 0.9 % IV SOLN: 500.0000 mL | INTRAVENOUS | Status: DC

## 2021-02-24 NOTE — Progress Notes (Signed)
Pt's states no medical or surgical changes since previsit or office visit. VS assessed by D.T 

## 2021-02-24 NOTE — Progress Notes (Signed)
Please refer to office visit note 02/18/21. No additional changes in H&P Patient is appropriate for planned procedure(s) and anesthesia in an ambulatory setting  K. Denzil Magnuson , MD [phone]

## 2021-02-24 NOTE — Progress Notes (Signed)
A and O x3. Report to RN. Tolerated MAC anesthesia well. 

## 2021-02-24 NOTE — Progress Notes (Signed)
Called to room to assist during endoscopic procedure.  Patient ID and intended procedure confirmed with present staff. Received instructions for my participation in the procedure from the performing physician.  

## 2021-02-24 NOTE — Op Note (Signed)
[facility] Patient Name: Vickie Holloway Procedure Date: 02/24/2021 3:38 PM MRN: [id number] Endoscopist: Mauri Pole , MD Age: 75 Referring MD:  Date of Birth: Oct 02, 1945 Gender: Female Account #: [id number] Procedure:                Colonoscopy Indications:              Abnormal CT of the GI tract Medicines:                Propofol per Anesthesia Procedure:                Pre-Anesthesia Assessment:                           - Prior to the procedure, a History and Physical                            was performed, and patient medications and                            allergies were reviewed. The patient's tolerance of                            previous anesthesia was also reviewed. The risks                            and benefits of the procedure and the sedation                            options and risks were discussed with the patient.                            All questions were answered, and informed consent                            was obtained. Prior Anticoagulants: The patient has                            taken no previous anticoagulant or antiplatelet                            agents. ASA Grade Assessment: II - A patient with                            mild systemic disease. After reviewing the risks                            and benefits, the patient was deemed in                            satisfactory condition to undergo the procedure.                           After obtaining informed consent, the colonoscope  was passed under direct vision. Throughout the                            procedure, the patient's blood pressure, pulse, and                            oxygen saturations were monitored continuously. The                            PCF-HQ190L Colonoscope was introduced through the                            anus and advanced to the the cecum, identified by                            appendiceal orifice and  ileocecal valve. The                            colonoscopy was performed without difficulty. The                            patient tolerated the procedure well. The quality                            of the bowel preparation was excellent. The                            ileocecal valve, appendiceal orifice, and rectum                            were photographed. Scope In: 3:53:47 PM Scope Out: 4:14:38 PM Scope Withdrawal Time: 0 hours 9 minutes 42 seconds  Total Procedure Duration: 0 hours 20 minutes 51 seconds  Findings:                 The perianal and digital rectal examinations were                            normal.                           A 25 mm polypoid lesion was found in the cecum. The                            lesion was granular lateral spreading. No bleeding                            was present. Biopsies were taken with a cold                            forceps for histology.                           An infiltrative partially obstructing large mass  was found in the ascending colon. The mass was                            partially circumferential (involving one-third of                            the lumen circumference). The mass measured three                            cm in length. No bleeding was present. Biopsies                            were taken with a cold forceps for histology. Area                            was tattooed with an injection of 3 mL of Spot                            (carbon black).                           Scattered small-mouthed diverticula were found in                            the sigmoid colon.                           Non-bleeding external and internal hemorrhoids were                            found during retroflexion. The hemorrhoids were                            medium-sized. Complications:            No immediate complications. Estimated Blood Loss:     Estimated blood loss was  minimal. Impression:               - Rule out malignancy, polypoid lesion in the                            cecum. Biopsied.                           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 Biopsied. Tattooed.                           - Diverticulosis in the sigmoid colon.                           - Non-bleeding external and internal hemorrhoids. Recommendation:           - Patient has a contact number available for  emergencies. The signs and symptoms of potential                            delayed complications were discussed with the                            patient. Return to normal activities tomorrow.                            Written discharge instructions were provided to the                            patient.                           - Resume previous diet.                           - Continue present medications.                           - Await pathology results.                           - Staging CT chest, to be scheduled next available                            appointment                           - Refer to colorectal surgery pending biopsy results Mauri Pole, MD 02/24/2021 4:22:19 PM This report has been signed electronically.

## 2021-02-24 NOTE — Patient Instructions (Signed)
Thank you for allowing [place] to care for you today. Resume previous diet and medications today. Staging CT scan to be scheduled. Referral to colorectal surgeon pending biopsy results.     YOU HAD AN ENDOSCOPIC PROCEDURE TODAY AT [facility]:   Refer to the procedure report that was given to you for any specific questions about what was found during the examination.  If the procedure report does not answer your questions, please call your gastroenterologist to clarify.  If you requested that your care partner not be given the details of your procedure findings, then the procedure report has been included in a sealed envelope for you to review at your convenience later.  YOU SHOULD EXPECT: Some feelings of bloating in the abdomen. Passage of more gas than usual.  Walking can help get rid of the air that was put into your GI tract during the procedure and reduce the bloating. If you had a lower endoscopy (such as a colonoscopy or flexible sigmoidoscopy) you may notice spotting of blood in your stool or on the toilet paper. If you underwent a bowel prep for your procedure, you may not have a normal bowel movement for a few days.  Please Note:  You might notice some irritation and congestion in your nose or some drainage.  This is from the oxygen used during your procedure.  There is no need for concern and it should clear up in a day or so.  SYMPTOMS TO REPORT IMMEDIATELY:  Following lower endoscopy (colonoscopy or flexible sigmoidoscopy):  Excessive amounts of blood in the stool  Significant tenderness or worsening of abdominal pains  Swelling of the abdomen that is new, acute  Fever of 100F or higher   For urgent or emergent issues, a gastroenterologist can be reached at any hour by calling [phone]. Do not use MyChart messaging for urgent concerns.    DIET:  We do recommend a small meal at first, but then you may proceed to your regular diet.  Drink plenty of fluids but  you should avoid alcoholic beverages for 24 hours.  ACTIVITY:  You should plan to take it easy for the rest of today and you should NOT DRIVE or use heavy machinery until tomorrow (because of the sedation medicines used during the test).    FOLLOW UP: Our staff will call the number listed on your records 48-72 hours following your procedure to check on you and address any questions or concerns that you may have regarding the information given to you following your procedure. If we do not reach you, we will leave a message.  We will attempt to reach you two times.  During this call, we will ask if you have developed any symptoms of COVID 19. If you develop any symptoms (ie: fever, flu-like symptoms, shortness of breath, cough etc.) before then, please call [phone].  If you test positive for Covid 19 in the 2 weeks post procedure, please call and report this information to [place].    If any biopsies were taken you will be contacted by phone or by letter within the next 1-3 weeks.  Please call us at [phone] if you have not heard about the biopsies in 3 weeks.    SIGNATURES/CONFIDENTIALITY: You and/or your care partner have signed paperwork which will be entered into your electronic medical record.  These signatures attest to the fact that that the information above on your After Visit Summary has been reviewed and is understood.  Full  responsibility of the confidentiality of this discharge information lies with you and/or your care-partner.  

## 2021-02-25 ENCOUNTER — Other Ambulatory Visit: Payer: Self-pay

## 2021-02-25 ENCOUNTER — Telehealth: Payer: Self-pay

## 2021-02-25 DIAGNOSIS — K6389 Other specified diseases of intestine: Secondary | ICD-10-CM

## 2021-02-25 DIAGNOSIS — R935 Abnormal findings on diagnostic imaging of other abdominal regions, including retroperitoneum: Secondary | ICD-10-CM

## 2021-02-25 DIAGNOSIS — R1900 Intra-abdominal and pelvic swelling, mass and lump, unspecified site: Secondary | ICD-10-CM

## 2021-02-25 NOTE — Telephone Encounter (Signed)
Order for CT chest with contrast entered. Radiology scheduling notified. No PA required per referral coordinating dept.

## 2021-02-26 ENCOUNTER — Telehealth: Payer: Self-pay

## 2021-02-26 NOTE — Telephone Encounter (Signed)
  Follow up Call-  Call back number 02/24/2021  Post procedure Call Back phone  # [phone]  Permission to leave phone message Yes  Some recent data might be hidden     Patient questions:  Do you have a fever, pain , or abdominal swelling? No. Pain Score  0 *  Have you tolerated food without any problems? Yes.    Have you been able to return to your normal activities? Yes.    Do you have any questions about your discharge instructions: Diet   No. Medications  No. Follow up visit  No.  Do you have questions or concerns about your Care? No.  Actions: * If pain score is 4 or above: No action needed, pain <4.

## 2021-02-27 ENCOUNTER — Encounter: Payer: Self-pay | Admitting: Gastroenterology

## 2021-03-01 ENCOUNTER — Telehealth: Payer: Self-pay | Admitting: Physician Assistant

## 2021-03-01 NOTE — Telephone Encounter (Signed)
Inbound call from patient, states that she is supposed to have another Ct scan on 12/16 and she is wondering why she is in need of that. Please advise.

## 2021-03-01 NOTE — Telephone Encounter (Signed)
Spoke with the patient. She has spoken with Dr Silverio Decamp. She understands that this is routine. She is aware that the results will be available to her before the provider gets to review. However we will be calling her with those results asap.

## 2021-03-01 NOTE — Telephone Encounter (Signed)
This is a duplicate message. The other message will be sent to Dr. Silverio Decamp.

## 2021-03-05 ENCOUNTER — Other Ambulatory Visit: Payer: Self-pay

## 2021-03-05 ENCOUNTER — Ambulatory Visit (HOSPITAL_BASED_OUTPATIENT_CLINIC_OR_DEPARTMENT_OTHER)
Admission: RE | Admit: 2021-03-05 | Discharge: 2021-03-05 | Disposition: A | Discharge status: Home / Self Care | Payer: Medicare Other | Source: Ambulatory Visit | Attending: Gastroenterology | Admitting: Gastroenterology

## 2021-03-05 DIAGNOSIS — K6389 Other specified diseases of intestine: Secondary | ICD-10-CM | POA: Insufficient documentation

## 2021-03-05 DIAGNOSIS — R935 Abnormal findings on diagnostic imaging of other abdominal regions, including retroperitoneum: Secondary | ICD-10-CM | POA: Insufficient documentation

## 2021-03-05 DIAGNOSIS — R1900 Intra-abdominal and pelvic swelling, mass and lump, unspecified site: Secondary | ICD-10-CM | POA: Diagnosis present

## 2021-03-05 MED ORDER — IOHEXOL 300 MG/ML  SOLN
100.0000 mL | Freq: Once | INTRAMUSCULAR | Status: AC | PRN
  Administered 2021-03-05: 75 mL via INTRAVENOUS

## 2021-03-18 NOTE — Progress Notes (Signed)
Requested surgery orders with Elmyra Ricks at Dr. Orest Dikes office.

## 2021-03-24 ENCOUNTER — Ambulatory Visit: Payer: Self-pay | Admitting: Surgery

## 2021-03-24 DIAGNOSIS — Z01818 Encounter for other preprocedural examination: Secondary | ICD-10-CM

## 2021-03-25 ENCOUNTER — Other Ambulatory Visit (HOSPITAL_COMMUNITY): Payer: Self-pay

## 2021-03-30 ENCOUNTER — Encounter (HOSPITAL_COMMUNITY): Payer: Self-pay

## 2021-03-30 NOTE — Progress Notes (Signed)
Reviewed and agree with documentation and assessment and plan. K. Veena Catheleen Langhorne , MD   

## 2021-03-30 NOTE — Progress Notes (Addendum)
COVID swab appointment: 04-01-21 @ 9:15  COVID Vaccine Completed:  Yes x2 Date COVID Vaccine completed: Has received booster: Yes x1 COVID vaccine manufacturer:   Moderna     Date of COVID positive in last 90 days:  No  PCP - Horald Pollen, MD Cardiologist -   Chest x-ray - CT chest 03-05-21 Epic EKG - 04-01-21 Epic Stress Test - N/A ECHO - N/A Cardiac Cath - N/A Pacemaker/ICD device last checked: Spinal Cord Stimulator:  Sleep Study - N/A CPAP -   Fasting Blood Sugar - N/A Checks Blood Sugar _____ times a day  Blood Thinner Instructions:N/A Aspirin Instructions: Last Dose:  Patient has bowel prep and instructions.  Activity level:   Can go up a flight of stairs and perform activities of daily living without stopping and without symptoms of chest pain or shortness of breath.  Able to exercise without symptoms  Anesthesia review: N/A  Patient denies shortness of breath, fever, cough and chest pain at PAT appointment  Patient verbalized understanding of instructions that were given to them at the PAT appointment. Patient was also instructed that they will need to review over the PAT instructions again at home before surgery.

## 2021-03-30 NOTE — Patient Instructions (Addendum)
DUE TO COVID-19 ONLY ONE VISITOR IS ALLOWED TO COME WITH YOU AND STAY IN THE WAITING ROOM ONLY DURING PRE OP AND PROCEDURE.   **NO VISITORS ARE ALLOWED IN THE SHORT STAY AREA OR RECOVERY ROOM!!**  IF YOU WILL BE ADMITTED INTO THE HOSPITAL YOU ARE ALLOWED ONLY TWO SUPPORT PEOPLE DURING VISITATION HOURS ONLY (7 AM -8PM)    Up to two visitors ages 32+ are allowed at one time in a patient's room.  The visitors may rotate out with other people throughout the day.  Additionally, up to two children between the ages of 73 and 104 are allowed and do not count toward the number of allowed visitors.  Children within this age range must be accompanied by an adult visitor.  One adult visitor may remain with the patient overnight and must be in the room by 8 PM.  You are not required to quarantine, however you are required to wear a well-fitted mask when you are out and around people not in your household.  Hand Hygiene often Do NOT share personal items Notify your provider if you are in close contact with someone who has COVID or you develop fever 100.4 or greater, new onset of sneezing, cough, sore throat, shortness of breath or body aches.   Your procedure is scheduled on: Monday, 04-05-21   Report to [facility] Main  Entrance     Report to admitting at 5:15 AM   Call this number if you have problems the morning of surgery [phone]   Follow a clear liquid diet day of prep to avoid dehydration.   Follow prep instructions from surgeon's office   May have liquids until 4:30 AM day of surgery  CLEAR LIQUID DIET  Foods Allowed                                                                     Foods Excluded  Water, Black Coffee (no milk/no creamer) and tea, regular and decaf                              liquids that you cannot  Plain Jell-O in any flavor  (No red)                         see through such as: Fruit ices (not with fruit pulp)                                 milk, soups,  orange juice  Iced Popsicles (No red)                                    All solid food                             Apple juices Sports drinks like Gatorade (No red) Lightly seasoned clear broth or consume(fat free) Sugar Sample Menu Breakfast  Lunch                                     Supper Cranberry juice                    Beef broth                            Chicken broth Jell-O                                     Grape juice                           Apple juice Coffee or tea                        Jell-O                                      Popsicle                                                Coffee or tea                        Coffee or tea      Drink 2 Ensure drinks the night before surgery, complete by 10 PM.  Complete one Ensure drink the morning of surgery 3 hours prior to scheduled surgery by 4:30 AM     The day of surgery:  Drink ONE (1) Pre-Surgery Clear Ensure the morning of surgery. Drink in one sitting. Do not sip.  This drink was given to you during your hospital  pre-op appointment visit. Nothing else to drink after completing the Pre-Surgery Clear Ensure           If you have questions, please contact your surgeons office.     Oral Hygiene is also important to reduce your risk of infection.                                    Remember - BRUSH YOUR TEETH THE MORNING OF SURGERY WITH YOUR REGULAR TOOTHPASTE   Do NOT smoke after Midnight   Take these medicines the morning of surgery with A SIP OF WATER:  Lamotrigine   Stop all vitamins and herbal supplements a week before surgery             You may not have any metal on your body including hair pins, jewelry, and body piercing             Do not wear make-up, lotions, powders, perfumes or deodorant  Do not wear nail polish including gel and S&S, artificial/acrylic nails, or any other type of covering on natural nails including finger and toenails. If you have artificial  nails, gel coating, etc. that needs to be removed by a nail salon please have this removed prior to surgery or  surgery may need to be canceled/ delayed if the surgeon/ anesthesia feels like they are unable to be safely monitored.   Do not shave  48 hours prior to surgery.   Do not bring valuables to the hospital. [place].   Contacts, dentures or bridgework may not be worn into surgery.   Bring small overnight bag day of surgery.   Special Instructions: Bring a copy of your healthcare power of attorney and living will documents the day of surgery if you haven't scanned them in before.  Please read over the following fact sheets you were given: IF YOU HAVE QUESTIONS ABOUT YOUR PRE OP INSTRUCTIONS PLEASE CALL [place] - Preparing for Surgery Before surgery, you can play an important role.  Because skin is not sterile, your skin needs to be as free of germs as possible.  You can reduce the number of germs on your skin by washing with CHG (chlorahexidine gluconate) soap before surgery.  CHG is an antiseptic cleaner which kills germs and bonds with the skin to continue killing germs even after washing. Please DO NOT use if you have an allergy to CHG or antibacterial soaps.  If your skin becomes reddened/irritated stop using the CHG and inform your nurse when you arrive at Short Stay. Do not shave (including legs and underarms) for at least 48 hours prior to the first CHG shower.  You may shave your face/neck.  Please follow these instructions carefully:  1.  Shower with CHG Soap the night before surgery and the  morning of surgery.  2.  If you choose to wash your hair, wash your hair first as usual with your normal  shampoo.  3.  After you shampoo, rinse your hair and body thoroughly to remove the shampoo.                             4.  Use CHG as you would any other liquid soap.  You can apply chg directly to the skin and wash.  Gently  with a scrungie or clean washcloth.  5.  Apply the CHG Soap to your body ONLY FROM THE NECK DOWN.   Do   not use on face/ open                           Wound or open sores. Avoid contact with eyes, ears mouth and   genitals (private parts).                       Wash face,  Genitals (private parts) with your normal soap.             6.  Wash thoroughly, paying special attention to the area where your    surgery  will be performed.  7.  Thoroughly rinse your body with warm water from the neck down.  8.  DO NOT shower/wash with your normal soap after using and rinsing off the CHG Soap.                9.  Pat yourself dry with a clean towel.            10.  Wear clean pajamas.            11.  Place clean sheets on your bed the night of your first shower and  do not  sleep with pets. Day of Surgery : Do not apply any lotions/deodorants the morning of surgery.  Please wear clean clothes to the hospital/surgery center.  FAILURE TO FOLLOW THESE INSTRUCTIONS MAY RESULT IN THE CANCELLATION OF YOUR SURGERY  PATIENT SIGNATURE_________________________________  NURSE SIGNATURE__________________________________  ________________________________________________________________________   Adam Phenix  An incentive spirometer is a tool that can help keep your lungs clear and active. This tool measures how well you are filling your lungs with each breath. Taking long deep breaths may help reverse or decrease the chance of developing breathing (pulmonary) problems (especially infection) following: A long period of time when you are unable to move or be active. BEFORE THE PROCEDURE  If the spirometer includes an indicator to show your best effort, your nurse or respiratory therapist will set it to a desired goal. If possible, sit up straight or lean slightly forward. Try not to slouch. Hold the incentive spirometer in an upright position. INSTRUCTIONS FOR USE  Sit on the edge of your bed if  possible, or sit up as far as you can in bed or on a chair. Hold the incentive spirometer in an upright position. Breathe out normally. Place the mouthpiece in your mouth and seal your lips tightly around it. Breathe in slowly and as deeply as possible, raising the piston or the ball toward the top of the column. Hold your breath for 3-5 seconds or for as long as possible. Allow the piston or ball to fall to the bottom of the column. Remove the mouthpiece from your mouth and breathe out normally. Rest for a few seconds and repeat Steps 1 through 7 at least 10 times every 1-2 hours when you are awake. Take your time and take a few normal breaths between deep breaths. The spirometer may include an indicator to show your best effort. Use the indicator as a goal to work toward during each repetition. After each set of 10 deep breaths, practice coughing to be sure your lungs are clear. If you have an incision (the cut made at the time of surgery), support your incision when coughing by placing a pillow or rolled up towels firmly against it. Once you are able to get out of bed, walk around indoors and cough well. You may stop using the incentive spirometer when instructed by your caregiver.  RISKS AND COMPLICATIONS Take your time so you do not get dizzy or light-headed. If you are in pain, you may need to take or ask for pain medication before doing incentive spirometry. It is harder to take a deep breath if you are having pain. AFTER USE Rest and breathe slowly and easily. It can be helpful to keep track of a log of your progress. Your caregiver can provide you with a simple table to help with this. If you are using the spirometer at home, follow these instructions: [place] IF:  You are having difficultly using the spirometer. You have trouble using the spirometer as often as instructed. Your pain medication is not giving enough relief while using the spirometer. You develop fever of  100.5 F (38.1 C) or higher. SEEK IMMEDIATE MEDICAL CARE IF:  You cough up bloody sputum that had not been present before. You develop fever of 102 F (38.9 C) or greater. You develop worsening pain at or near the incision site. MAKE SURE YOU:  Understand these instructions. Will watch your condition. Will get help right away if you are not doing well or get worse. Document  Released: 07/18/2006 Document Revised: 05/30/2011 Document Reviewed: 09/18/2006 Exit[employer] Patient Information 2014 Exit[employer], [place].   ________________________________________________________________________  WHAT IS A BLOOD TRANSFUSION? Blood Transfusion Information  A transfusion is the replacement of blood or some of its parts. Blood is made up of multiple cells which provide different functions. Red blood cells carry oxygen and are used for blood loss replacement. White blood cells fight against infection. Platelets control bleeding. Plasma helps clot blood. Other blood products are available for specialized needs, such as hemophilia or other clotting disorders. BEFORE THE TRANSFUSION  Who gives blood for transfusions?  Healthy volunteers who are fully evaluated to make sure their blood is safe. This is blood bank blood. Transfusion therapy is the safest it has ever been in the practice of medicine. Before blood is taken from a donor, a complete history is taken to make sure that person has no history of diseases nor engages in risky social behavior (examples are intravenous drug use or sexual activity with multiple partners). The donor's travel history is screened to minimize risk of transmitting infections, such as malaria. The donated blood is tested for signs of infectious diseases, such as HIV and hepatitis. The blood is then tested to be sure it is compatible with you in order to minimize the chance of a transfusion reaction. If you or a relative donates blood, this is often done in anticipation of surgery and  is not appropriate for emergency situations. It takes many days to process the donated blood. RISKS AND COMPLICATIONS Although transfusion therapy is very safe and saves many lives, the main dangers of transfusion include:  Getting an infectious disease. Developing a transfusion reaction. This is an allergic reaction to something in the blood you were given. Every precaution is taken to prevent this. The decision to have a blood transfusion has been considered carefully by your caregiver before blood is given. Blood is not given unless the benefits outweigh the risks. AFTER THE TRANSFUSION Right after receiving a blood transfusion, you will usually feel much better and more energetic. This is especially true if your red blood cells have gotten low (anemic). The transfusion raises the level of the red blood cells which carry oxygen, and this usually causes an energy increase. The nurse administering the transfusion will monitor you carefully for complications. HOME CARE INSTRUCTIONS  No special instructions are needed after a transfusion. You may find your energy is better. Speak with your caregiver about any limitations on activity for underlying diseases you may have. SEEK MEDICAL CARE IF:  Your condition is not improving after your transfusion. You develop redness or irritation at the intravenous (IV) site. SEEK IMMEDIATE MEDICAL CARE IF:  Any of the following symptoms occur over the next 12 hours: Shaking chills. You have a temperature by mouth above 102 F (38.9 C), not controlled by medicine. Chest, back, or muscle pain. People around you feel you are not acting correctly or are confused. Shortness of breath or difficulty breathing. Dizziness and fainting. You get a rash or develop hives. You have a decrease in urine output. Your urine turns a dark color or changes to pink, red, or brown. Any of the following symptoms occur over the next 10 days: You have a temperature by mouth above  102 F (38.9 C), not controlled by medicine. Shortness of breath. Weakness after normal activity. The white part of the eye turns yellow (jaundice). You have a decrease in the amount of urine or are urinating less often. Your urine turns a dark  color or changes to pink, red, or brown. Document Released: 03/04/2000 Document Revised: 05/30/2011 Document Reviewed: 10/22/2007 [facility] Patient Information 2014 [place], [place].  _______________________________________________________________________

## 2021-04-01 ENCOUNTER — Other Ambulatory Visit: Payer: Self-pay

## 2021-04-01 ENCOUNTER — Encounter (HOSPITAL_COMMUNITY)
Admission: RE | Admit: 2021-04-01 | Discharge: 2021-04-01 | Disposition: A | Discharge status: Home / Self Care | Payer: Medicare Other | Source: Ambulatory Visit | Attending: Surgery | Admitting: Surgery

## 2021-04-01 ENCOUNTER — Encounter (HOSPITAL_COMMUNITY): Payer: Self-pay

## 2021-04-01 VITALS — BP 134/80 | HR 72 | Temp 98.4°F | Resp 18 | Ht 65.0 in | Wt 118.8 lb

## 2021-04-01 DIAGNOSIS — Z20822 Contact with and (suspected) exposure to covid-19: Secondary | ICD-10-CM | POA: Diagnosis not present

## 2021-04-01 DIAGNOSIS — Z01818 Encounter for other preprocedural examination: Secondary | ICD-10-CM | POA: Diagnosis present

## 2021-04-01 DIAGNOSIS — I2510 Atherosclerotic heart disease of native coronary artery without angina pectoris: Secondary | ICD-10-CM | POA: Diagnosis not present

## 2021-04-01 HISTORY — DX: Bipolar disorder, unspecified: F31.9

## 2021-04-01 HISTORY — DX: Insomnia, unspecified: G47.00

## 2021-04-01 HISTORY — DX: Depression, unspecified: F32.A

## 2021-04-01 HISTORY — DX: Malignant neoplasm of colon, unspecified: C18.9

## 2021-04-01 LAB — BASIC METABOLIC PANEL
Anion gap: 6 (ref 5–15)
BUN: 23 mg/dL (ref 8–23)
CO2: 28 mmol/L (ref 22–32)
Calcium: 9.5 mg/dL (ref 8.9–10.3)
Chloride: 104 mmol/L (ref 98–111)
Creatinine, Ser: 0.64 mg/dL (ref 0.44–1.00)
GFR, Estimated: >60 mL/min (ref >60)
Glucose, Bld: 91 mg/dL (ref 70–99)
Potassium: 4.2 mmol/L (ref 3.5–5.1)
Sodium: 138 mmol/L (ref 135–145)

## 2021-04-01 LAB — CBC
HCT: 38.8 % (ref 36.0–46.0)
Hemoglobin: 11.9 g/dL — ABNORMAL LOW (ref 12.0–15.0)
MCH: 28.1 pg (ref 26.0–34.0)
MCHC: 30.7 g/dL (ref 30.0–36.0)
MCV: 91.5 fL (ref 80.0–100.0)
Platelets: 292 10*3/uL (ref 150–400)
RBC: 4.24 MIL/uL (ref 3.87–5.11)
RDW: 13.7 % (ref 11.5–15.5)
WBC: 4.3 10*3/uL (ref 4.0–10.5)
nRBC: 0 % (ref 0.0–0.2)

## 2021-04-01 LAB — SARS CORONAVIRUS 2 (TAT 6-24 HRS): SARS Coronavirus 2: NEGATIVE

## 2021-04-04 NOTE — Anesthesia Preprocedure Evaluation (Addendum)
Anesthesia Evaluation  Patient identified by MRN, date of birth, ID band Patient awake    Reviewed: Allergy & Precautions, H&P , NPO status , Patient's Chart, lab work & pertinent test results  Airway Mallampati: III  TM Distance: >3 FB Neck ROM: Full  Mouth opening: Limited Mouth Opening  Dental no notable dental hx. (+) Teeth Intact, Dental Advisory Given   Pulmonary neg pulmonary ROS, former smoker,    Pulmonary exam normal breath sounds clear to auscultation       Cardiovascular Exercise Tolerance: Good hypertension, Pt. on medications  Rhythm:Regular Rate:Normal     Neuro/Psych Anxiety Depression negative neurological ROS     GI/Hepatic negative GI ROS, Neg liver ROS,   Endo/Other  negative endocrine ROS  Renal/GU negative Renal ROS  negative genitourinary   Musculoskeletal  (+) Arthritis , Osteoarthritis,    Abdominal   Peds  Hematology negative hematology ROS (+)   Anesthesia Other Findings   Reproductive/Obstetrics negative OB ROS                            Anesthesia Physical Anesthesia Plan  ASA: 2  Anesthesia Plan: General   Post-op Pain Management: Tylenol PO (pre-op)   Induction: Intravenous  PONV Risk Score and Plan: 4 or greater and Ondansetron, Dexamethasone and Treatment may vary due to age or medical condition  Airway Management Planned: Oral ETT  Additional Equipment:   Intra-op Plan:   Post-operative Plan: Extubation in OR  Informed Consent: I have reviewed the patients History and Physical, chart, labs and discussed the procedure including the risks, benefits and alternatives for the proposed anesthesia with the patient or authorized representative who has indicated his/her understanding and acceptance.     Dental advisory given  Plan Discussed with: CRNA  Anesthesia Plan Comments:        Anesthesia Quick Evaluation

## 2021-04-05 ENCOUNTER — Other Ambulatory Visit (HOSPITAL_COMMUNITY): Payer: Self-pay

## 2021-04-05 ENCOUNTER — Inpatient Hospital Stay (HOSPITAL_COMMUNITY): Payer: Medicare Other | Admitting: Anesthesiology

## 2021-04-05 ENCOUNTER — Encounter (HOSPITAL_COMMUNITY)
Admission: RE | Disposition: A | Discharge status: Home / Self Care | Payer: Self-pay | Source: Home / Self Care | Attending: Surgery

## 2021-04-05 ENCOUNTER — Inpatient Hospital Stay (HOSPITAL_COMMUNITY)
Admission: RE | Admit: 2021-04-05 | Discharge: 2021-04-08 | DRG: 330 | Disposition: A | Discharge status: Home / Self Care | Payer: Medicare Other | Attending: Surgery | Admitting: Surgery

## 2021-04-05 ENCOUNTER — Encounter (HOSPITAL_COMMUNITY): Payer: Self-pay | Admitting: Surgery

## 2021-04-05 ENCOUNTER — Other Ambulatory Visit: Payer: Self-pay

## 2021-04-05 DIAGNOSIS — E876 Hypokalemia: Secondary | ICD-10-CM | POA: Diagnosis not present

## 2021-04-05 DIAGNOSIS — D62 Acute posthemorrhagic anemia: Secondary | ICD-10-CM | POA: Diagnosis not present

## 2021-04-05 DIAGNOSIS — I10 Essential (primary) hypertension: Secondary | ICD-10-CM | POA: Diagnosis present

## 2021-04-05 DIAGNOSIS — Z961 Presence of intraocular lens: Secondary | ICD-10-CM | POA: Diagnosis present

## 2021-04-05 DIAGNOSIS — Z8249 Family history of ischemic heart disease and other diseases of the circulatory system: Secondary | ICD-10-CM

## 2021-04-05 DIAGNOSIS — C189 Malignant neoplasm of colon, unspecified: Secondary | ICD-10-CM | POA: Diagnosis present

## 2021-04-05 DIAGNOSIS — Z90710 Acquired absence of both cervix and uterus: Secondary | ICD-10-CM | POA: Diagnosis not present

## 2021-04-05 DIAGNOSIS — Z87891 Personal history of nicotine dependence: Secondary | ICD-10-CM

## 2021-04-05 DIAGNOSIS — Z9049 Acquired absence of other specified parts of digestive tract: Secondary | ICD-10-CM

## 2021-04-05 DIAGNOSIS — F32A Depression, unspecified: Secondary | ICD-10-CM | POA: Diagnosis present

## 2021-04-05 DIAGNOSIS — Z9842 Cataract extraction status, left eye: Secondary | ICD-10-CM

## 2021-04-05 HISTORY — PX: LAPAROSCOPIC RIGHT HEMI COLECTOMY: SHX5926

## 2021-04-05 LAB — CBC
HCT: 34 % — ABNORMAL LOW (ref 36.0–46.0)
Hemoglobin: 10.6 g/dL — ABNORMAL LOW (ref 12.0–15.0)
MCH: 28 pg (ref 26.0–34.0)
MCHC: 31.2 g/dL (ref 30.0–36.0)
MCV: 89.9 fL (ref 80.0–100.0)
Platelets: 252 10*3/uL (ref 150–400)
RBC: 3.78 MIL/uL — ABNORMAL LOW (ref 3.87–5.11)
RDW: 13.6 % (ref 11.5–15.5)
WBC: 8.5 10*3/uL (ref 4.0–10.5)
nRBC: 0 % (ref 0.0–0.2)

## 2021-04-05 LAB — CREATININE, SERUM
Creatinine, Ser: 0.7 mg/dL (ref 0.44–1.00)
GFR, Estimated: >60 mL/min (ref >60)

## 2021-04-05 LAB — TYPE AND SCREEN
ABO/RH(D): O POS
Antibody Screen: NEGATIVE

## 2021-04-05 LAB — ABO/RH: ABO/RH(D): O POS

## 2021-04-05 SURGERY — LAPAROSCOPIC RIGHT HEMI COLECTOMY
Anesthesia: General | Site: Abdomen | Laterality: Right

## 2021-04-05 MED ORDER — PROPOFOL 10 MG/ML IV BOLUS
INTRAVENOUS | Status: AC
  Filled 2021-04-05: qty 20

## 2021-04-05 MED ORDER — HYDRALAZINE HCL 20 MG/ML IJ SOLN: 10.0000 mg | INTRAMUSCULAR | Status: DC | PRN

## 2021-04-05 MED ORDER — PROPOFOL 10 MG/ML IV BOLUS
INTRAVENOUS | Status: DC | PRN
  Administered 2021-04-05: 130 mg via INTRAVENOUS

## 2021-04-05 MED ORDER — ROCURONIUM BROMIDE 10 MG/ML (PF) SYRINGE
PREFILLED_SYRINGE | INTRAVENOUS | Status: AC
  Filled 2021-04-05: qty 10

## 2021-04-05 MED ORDER — SODIUM CHLORIDE 0.9 % IV SOLN
2.0000 g | INTRAVENOUS | Status: AC
  Administered 2021-04-05: 2 g via INTRAVENOUS
  Filled 2021-04-05: qty 2

## 2021-04-05 MED ORDER — CHLORHEXIDINE GLUCONATE CLOTH 2 % EX PADS: 6.0000 | MEDICATED_PAD | Freq: Once | CUTANEOUS | Status: DC

## 2021-04-05 MED ORDER — ONDANSETRON HCL 4 MG/2ML IJ SOLN
INTRAMUSCULAR | Status: DC | PRN
Start: 2021-04-05 — End: 2021-04-05
  Administered 2021-04-05: 4 mg via INTRAVENOUS

## 2021-04-05 MED ORDER — KETAMINE HCL 50 MG/5ML IJ SOSY
PREFILLED_SYRINGE | INTRAMUSCULAR | Status: AC
  Filled 2021-04-05: qty 10

## 2021-04-05 MED ORDER — ONDANSETRON HCL 4 MG PO TABS
4.0000 mg | ORAL_TABLET | Freq: Four times a day (QID) | ORAL | Status: DC | PRN
  Administered 2021-04-06: 4 mg via ORAL
  Filled 2021-04-05: qty 1

## 2021-04-05 MED ORDER — ACETAMINOPHEN 500 MG PO TABS
1000.0000 mg | ORAL_TABLET | ORAL | Status: AC
  Administered 2021-04-05: 1000 mg via ORAL
  Filled 2021-04-05: qty 2

## 2021-04-05 MED ORDER — DEXAMETHASONE SODIUM PHOSPHATE 10 MG/ML IJ SOLN
INTRAMUSCULAR | Status: DC | PRN
  Administered 2021-04-05: 5 mg via INTRAVENOUS

## 2021-04-05 MED ORDER — CHLORHEXIDINE GLUCONATE 0.12 % MT SOLN
15.0000 mL | Freq: Once | OROMUCOSAL | Status: AC
  Administered 2021-04-05: 15 mL via OROMUCOSAL

## 2021-04-05 MED ORDER — SUGAMMADEX SODIUM 200 MG/2ML IV SOLN
INTRAVENOUS | Status: DC | PRN
  Administered 2021-04-05: 150 mg via INTRAVENOUS

## 2021-04-05 MED ORDER — 0.9 % SODIUM CHLORIDE (POUR BTL) OPTIME
TOPICAL | Status: DC | PRN
  Administered 2021-04-05: 2000 mL

## 2021-04-05 MED ORDER — FENTANYL CITRATE (PF) 250 MCG/5ML IJ SOLN
INTRAMUSCULAR | Status: DC | PRN
  Administered 2021-04-05: 100 ug via INTRAVENOUS
  Administered 2021-04-05: 50 ug via INTRAVENOUS

## 2021-04-05 MED ORDER — ALVIMOPAN 12 MG PO CAPS
12.0000 mg | ORAL_CAPSULE | ORAL | Status: AC
  Administered 2021-04-05: 12 mg via ORAL
  Filled 2021-04-05: qty 1

## 2021-04-05 MED ORDER — BUPIVACAINE-EPINEPHRINE (PF) 0.25% -1:200000 IJ SOLN
INTRAMUSCULAR | Status: AC
  Filled 2021-04-05: qty 30

## 2021-04-05 MED ORDER — ALUM & MAG HYDROXIDE-SIMETH 200-200-20 MG/5ML PO SUSP: 30.0000 mL | Freq: Four times a day (QID) | ORAL | Status: DC | PRN

## 2021-04-05 MED ORDER — ENSURE PRE-SURGERY PO LIQD
296.0000 mL | Freq: Once | ORAL | Status: DC
  Filled 2021-04-05: qty 296

## 2021-04-05 MED ORDER — LACTATED RINGERS IR SOLN
Status: DC | PRN
  Administered 2021-04-05: 1000 mL

## 2021-04-05 MED ORDER — ENSURE PRE-SURGERY PO LIQD
592.0000 mL | Freq: Once | ORAL | Status: DC
  Filled 2021-04-05: qty 592

## 2021-04-05 MED ORDER — ALVIMOPAN 12 MG PO CAPS
12.0000 mg | ORAL_CAPSULE | Freq: Two times a day (BID) | ORAL | Status: DC
  Administered 2021-04-06 – 2021-04-07 (×4): 12 mg via ORAL
  Filled 2021-04-05 (×4): qty 1

## 2021-04-05 MED ORDER — BISACODYL 5 MG PO TBEC: 20.0000 mg | DELAYED_RELEASE_TABLET | Freq: Once | ORAL | Status: DC

## 2021-04-05 MED ORDER — BUPIVACAINE LIPOSOME 1.3 % IJ SUSP: 20.0000 mL | Freq: Once | INTRAMUSCULAR | Status: DC

## 2021-04-05 MED ORDER — DEXAMETHASONE SODIUM PHOSPHATE 10 MG/ML IJ SOLN
INTRAMUSCULAR | Status: AC
  Filled 2021-04-05: qty 1

## 2021-04-05 MED ORDER — LIDOCAINE HCL 2 % IJ SOLN
INTRAMUSCULAR | Status: AC
  Filled 2021-04-05: qty 20

## 2021-04-05 MED ORDER — LIDOCAINE 2% (20 MG/ML) 5 ML SYRINGE
INTRAMUSCULAR | Status: DC | PRN
Start: 2021-04-05 — End: 2021-04-05
  Administered 2021-04-05: 60 mg via INTRAVENOUS

## 2021-04-05 MED ORDER — LAMOTRIGINE 100 MG PO TABS
200.0000 mg | ORAL_TABLET | Freq: Every day | ORAL | Status: DC
  Administered 2021-04-06 – 2021-04-07 (×2): 200 mg via ORAL
  Filled 2021-04-05 (×2): qty 2

## 2021-04-05 MED ORDER — SIMETHICONE 80 MG PO CHEW: 40.0000 mg | CHEWABLE_TABLET | Freq: Four times a day (QID) | ORAL | Status: DC | PRN

## 2021-04-05 MED ORDER — EPHEDRINE SULFATE-NACL 50-0.9 MG/10ML-% IV SOSY
PREFILLED_SYRINGE | INTRAVENOUS | Status: DC | PRN
  Administered 2021-04-05 (×2): 5 mg via INTRAVENOUS

## 2021-04-05 MED ORDER — LIDOCAINE HCL (PF) 2 % IJ SOLN
INTRAMUSCULAR | Status: AC
  Filled 2021-04-05: qty 5

## 2021-04-05 MED ORDER — HEPARIN SODIUM (PORCINE) 5000 UNIT/ML IJ SOLN
5000.0000 [IU] | Freq: Once | INTRAMUSCULAR | Status: AC
  Administered 2021-04-05: 5000 [IU] via SUBCUTANEOUS
  Filled 2021-04-05: qty 1

## 2021-04-05 MED ORDER — BUPIVACAINE LIPOSOME 1.3 % IJ SUSP
INTRAMUSCULAR | Status: DC | PRN
  Administered 2021-04-05: 20 mL

## 2021-04-05 MED ORDER — ONDANSETRON HCL 4 MG/2ML IJ SOLN
4.0000 mg | Freq: Four times a day (QID) | INTRAMUSCULAR | Status: DC | PRN
  Administered 2021-04-05: 4 mg via INTRAVENOUS
  Filled 2021-04-05: qty 2

## 2021-04-05 MED ORDER — ONDANSETRON HCL 4 MG/2ML IJ SOLN
INTRAMUSCULAR | Status: AC
  Filled 2021-04-05: qty 2

## 2021-04-05 MED ORDER — BUPIVACAINE-EPINEPHRINE 0.25% -1:200000 IJ SOLN
INTRAMUSCULAR | Status: DC | PRN
  Administered 2021-04-05: 30 mL

## 2021-04-05 MED ORDER — EPHEDRINE 5 MG/ML INJ
INTRAVENOUS | Status: AC
  Filled 2021-04-05: qty 5

## 2021-04-05 MED ORDER — ENSURE SURGERY PO LIQD
237.0000 mL | Freq: Two times a day (BID) | ORAL | Status: DC
  Administered 2021-04-06 – 2021-04-07 (×3): 237 mL via ORAL

## 2021-04-05 MED ORDER — HEPARIN SODIUM (PORCINE) 5000 UNIT/ML IJ SOLN
5000.0000 [IU] | Freq: Three times a day (TID) | INTRAMUSCULAR | Status: DC
  Administered 2021-04-05 – 2021-04-07 (×8): 5000 [IU] via SUBCUTANEOUS
  Filled 2021-04-05 (×8): qty 1

## 2021-04-05 MED ORDER — ORAL CARE MOUTH RINSE: 15.0000 mL | Freq: Once | OROMUCOSAL | Status: AC

## 2021-04-05 MED ORDER — ZOLPIDEM TARTRATE 5 MG PO TABS
5.0000 mg | ORAL_TABLET | Freq: Once | ORAL | Status: AC
  Administered 2021-04-05: 5 mg via ORAL
  Filled 2021-04-05: qty 1

## 2021-04-05 MED ORDER — TRAMADOL HCL 50 MG PO TABS
50.0000 mg | ORAL_TABLET | Freq: Four times a day (QID) | ORAL | Status: DC | PRN
  Administered 2021-04-05 – 2021-04-07 (×4): 50 mg via ORAL
  Filled 2021-04-05 (×5): qty 1

## 2021-04-05 MED ORDER — ACETAMINOPHEN 500 MG PO TABS: 1000.0000 mg | ORAL_TABLET | Freq: Once | ORAL | Status: DC

## 2021-04-05 MED ORDER — SERTRALINE HCL 50 MG PO TABS
50.0000 mg | ORAL_TABLET | Freq: Every day | ORAL | Status: DC
  Filled 2021-04-05 (×2): qty 1

## 2021-04-05 MED ORDER — IRBESARTAN 150 MG PO TABS
150.0000 mg | ORAL_TABLET | Freq: Every day | ORAL | Status: DC
  Administered 2021-04-06 – 2021-04-07 (×2): 150 mg via ORAL
  Filled 2021-04-05 (×2): qty 1

## 2021-04-05 MED ORDER — FENTANYL CITRATE (PF) 250 MCG/5ML IJ SOLN
INTRAMUSCULAR | Status: AC
  Filled 2021-04-05: qty 5

## 2021-04-05 MED ORDER — LACTATED RINGERS IV SOLN: INTRAVENOUS | Status: DC

## 2021-04-05 MED ORDER — IBUPROFEN 200 MG PO TABS: 600.0000 mg | ORAL_TABLET | Freq: Four times a day (QID) | ORAL | Status: DC | PRN

## 2021-04-05 MED ORDER — TRAMADOL HCL 50 MG PO TABS
50.0000 mg | ORAL_TABLET | Freq: Four times a day (QID) | ORAL | 0 refills | Status: AC | PRN
  Filled 2021-04-05: qty 15, 4d supply, fill #0

## 2021-04-05 MED ORDER — ACETAMINOPHEN 500 MG PO TABS
1000.0000 mg | ORAL_TABLET | Freq: Four times a day (QID) | ORAL | Status: DC
  Administered 2021-04-05 – 2021-04-07 (×10): 1000 mg via ORAL
  Filled 2021-04-05 (×10): qty 2

## 2021-04-05 MED ORDER — POLYETHYLENE GLYCOL 3350 17 GM/SCOOP PO POWD: 1.0000 | Freq: Once | ORAL | Status: DC

## 2021-04-05 MED ORDER — HYDROMORPHONE HCL 1 MG/ML IJ SOLN: 0.2500 mg | INTRAMUSCULAR | Status: DC | PRN

## 2021-04-05 MED ORDER — ROCURONIUM BROMIDE 10 MG/ML (PF) SYRINGE
PREFILLED_SYRINGE | INTRAVENOUS | Status: DC | PRN
  Administered 2021-04-05: 20 mg via INTRAVENOUS
  Administered 2021-04-05: 60 mg via INTRAVENOUS

## 2021-04-05 MED ORDER — BUPIVACAINE LIPOSOME 1.3 % IJ SUSP
INTRAMUSCULAR | Status: AC
  Filled 2021-04-05: qty 20

## 2021-04-05 MED ORDER — KETAMINE HCL 10 MG/ML IJ SOLN
INTRAMUSCULAR | Status: DC | PRN
Start: 2021-04-05 — End: 2021-04-05
  Administered 2021-04-05: 25 mg via INTRAVENOUS

## 2021-04-05 MED ORDER — DIPHENHYDRAMINE HCL 12.5 MG/5ML PO ELIX: 12.5000 mg | ORAL_SOLUTION | Freq: Four times a day (QID) | ORAL | Status: DC | PRN

## 2021-04-05 MED ORDER — LIDOCAINE 20MG/ML (2%) 15 ML SYRINGE OPTIME
INTRAMUSCULAR | Status: DC | PRN
  Administered 2021-04-05: 1.5 mg/kg/h via INTRAVENOUS

## 2021-04-05 MED ORDER — HYDROMORPHONE HCL 1 MG/ML IJ SOLN: 0.5000 mg | INTRAMUSCULAR | Status: DC | PRN

## 2021-04-05 MED ORDER — DIPHENHYDRAMINE HCL 50 MG/ML IJ SOLN: 12.5000 mg | Freq: Four times a day (QID) | INTRAMUSCULAR | Status: DC | PRN

## 2021-04-05 SURGICAL SUPPLY — 64 items
APPLIER CLIP ROT 10 11.4 M/L (STAPLE)
BAG COUNTER SPONGE SURGICOUNT (BAG) IMPLANT
BLADE CLIPPER SURG (BLADE) IMPLANT
CABLE HIGH FREQUENCY MONO STRZ (ELECTRODE) ×4 IMPLANT
CELLS DAT CNTRL 66122 CELL SVR (MISCELLANEOUS) IMPLANT
CHLORAPREP W/TINT 26 (MISCELLANEOUS) ×2 IMPLANT
CLIP APPLIE ROT 10 11.4 M/L (STAPLE) IMPLANT
DECANTER SPIKE VIAL GLASS SM (MISCELLANEOUS) ×2 IMPLANT
DERMABOND ADVANCED (GAUZE/BANDAGES/DRESSINGS) ×1
DERMABOND ADVANCED .7 DNX12 (GAUZE/BANDAGES/DRESSINGS) IMPLANT
DISSECTOR BLUNT TIP ENDO 5MM (MISCELLANEOUS) IMPLANT
ELECT REM PT RETURN 15FT ADLT (MISCELLANEOUS) ×2 IMPLANT
GAUZE SPONGE 4X4 12PLY STRL (GAUZE/BANDAGES/DRESSINGS) ×2 IMPLANT
GLOVE SURG ENC MOIS LTX SZ7.5 (GLOVE) ×4 IMPLANT
GLOVE SURG NEOPR MICRO LF SZ8 (GLOVE) ×4 IMPLANT
GOWN STRL REUS W/TWL XL LVL3 (GOWN DISPOSABLE) ×8 IMPLANT
IRRIG SUCT STRYKERFLOW 2 WTIP (MISCELLANEOUS)
IRRIGATION SUCT STRKRFLW 2 WTP (MISCELLANEOUS) IMPLANT
KIT TURNOVER KIT A (KITS) IMPLANT
LIGASURE IMPACT 36 18CM CVD LR (INSTRUMENTS) IMPLANT
NS IRRIG 1000ML POUR BTL (IV SOLUTION) ×2 IMPLANT
PACK COLON (CUSTOM PROCEDURE TRAY) ×2 IMPLANT
PAD POSITIONING PINK XL (MISCELLANEOUS) IMPLANT
PENCIL SMOKE EVACUATOR (MISCELLANEOUS) IMPLANT
PROTECTOR NERVE ULNAR (MISCELLANEOUS) IMPLANT
RELOAD PROXIMATE 75MM BLUE (ENDOMECHANICALS) ×4 IMPLANT
RELOAD STAPLE 75 3.8 BLU REG (ENDOMECHANICALS) IMPLANT
RETRACTOR WND ALEXIS 18 MED (MISCELLANEOUS) IMPLANT
RTRCTR WOUND ALEXIS 18CM MED (MISCELLANEOUS)
SCISSORS LAP 5X35 DISP (ENDOMECHANICALS) ×2 IMPLANT
SEALER TISSUE G2 STRG ARTC 35C (ENDOMECHANICALS) IMPLANT
SET TUBE SMOKE EVAC HIGH FLOW (TUBING) ×2 IMPLANT
SHEARS HARMONIC ACE PLUS 36CM (ENDOMECHANICALS) IMPLANT
SLEEVE ADV FIXATION 5X100MM (TROCAR) ×4 IMPLANT
SLEEVE ENDOPATH XCEL 5M (ENDOMECHANICALS) ×2 IMPLANT
STAPLER 90 3.5 STAND SLIM (STAPLE) ×2
STAPLER 90 3.5 STD SLIM (STAPLE) IMPLANT
STAPLER GUN LINEAR PROX 60 (STAPLE) IMPLANT
STAPLER PROXIMATE 75MM BLUE (STAPLE) ×1 IMPLANT
STAPLER VISISTAT 35W (STAPLE) ×2 IMPLANT
SUT MNCRL AB 4-0 PS2 18 (SUTURE) ×2 IMPLANT
SUT PDS AB 1 CT1 27 (SUTURE) ×4 IMPLANT
SUT PROLENE 2 0 CT2 30 (SUTURE) IMPLANT
SUT PROLENE 2 0 KS (SUTURE) IMPLANT
SUT SILK 2 0 (SUTURE) ×1
SUT SILK 2 0 SH CR/8 (SUTURE) ×2 IMPLANT
SUT SILK 2-0 18XBRD TIE 12 (SUTURE) ×1 IMPLANT
SUT SILK 3 0 (SUTURE) ×2
SUT SILK 3 0 SH CR/8 (SUTURE) ×2 IMPLANT
SUT SILK 3-0 18XBRD TIE 12 (SUTURE) ×1 IMPLANT
SYS LAPSCP GELPORT 120MM (MISCELLANEOUS)
SYS WOUND ALEXIS 18CM MED (MISCELLANEOUS) ×2
SYSTEM LAPSCP GELPORT 120MM (MISCELLANEOUS) IMPLANT
SYSTEM WOUND ALEXIS 18CM MED (MISCELLANEOUS) ×1 IMPLANT
TAPE CLOTH 4X10 WHT NS (GAUZE/BANDAGES/DRESSINGS) IMPLANT
TOWEL OR 17X26 10 PK STRL BLUE (TOWEL DISPOSABLE) ×2 IMPLANT
TRAY FOLEY MTR SLVR 14FR STAT (SET/KITS/TRAYS/PACK) ×2 IMPLANT
TRAY FOLEY MTR SLVR 16FR STAT (SET/KITS/TRAYS/PACK) IMPLANT
TROCAR ADV FIXATION 5X100MM (TROCAR) ×2 IMPLANT
TROCAR BALLN 12MMX100 BLUNT (TROCAR) ×2 IMPLANT
TROCAR XCEL 12X100 BLDLESS (ENDOMECHANICALS) ×2 IMPLANT
TROCAR XCEL NON-BLD 11X100MML (ENDOMECHANICALS) IMPLANT
TUBING CONNECTING 10 (TUBING) ×4 IMPLANT
YANKAUER SUCT BULB TIP NO VENT (SUCTIONS) ×2 IMPLANT

## 2021-04-05 NOTE — Transfer of Care (Signed)
Immediate Anesthesia Transfer of Care Note  Patient: Vickie Holloway  Procedure(s) Performed: LAPAROSCOPIC Holloway HEMI COLECTOMY (Holloway: Abdomen)  Patient Location: PACU  Anesthesia Type:General  Level of Consciousness: awake, alert  and patient cooperative  Airway & Oxygen Therapy: Patient Spontanous Breathing and Patient connected to face mask oxygen  Post-op Assessment: Report given to RN and Post -op Vital signs reviewed and stable  Post vital signs: Reviewed and stable  Last Vitals:  Vitals Value Taken Time  BP 124/64 04/05/21 0953  Temp    Pulse 82 04/05/21 0955  Resp 17 04/05/21 0955  SpO2 100 % 04/05/21 0955  Vitals shown include unvalidated device data.  Last Pain:  Vitals:   04/05/21 0614  TempSrc: Oral  PainSc:          Complications: No notable events documented.

## 2021-04-05 NOTE — Discharge Instructions (Signed)
POST OP INSTRUCTIONS AFTER COLON SURGERY  DIET: Be sure to include lots of fluids daily to stay hydrated - 64oz of water per day (8, 8 oz glasses).  Avoid fast food or heavy meals for the first couple of weeks as your are more likely to get nauseated. Avoid raw/uncooked fruits or vegetables for the first 4 weeks (its ok to have these if they are blended into smoothie form). If you have fruits/vegetables, make sure they are cooked until soft enough to mash on the roof of your mouth and chew your food well. Otherwise, diet as tolerated.  Take your usually prescribed home medications unless otherwise directed.  PAIN CONTROL: Pain is best controlled by a usual combination of three different methods TOGETHER: Ice/Heat Over the counter pain medication Prescription pain medication Most patients will experience some swelling and bruising around the surgical site.  Ice packs or heating pads (30-60 minutes up to 6 times a day) will help. Some people prefer to use ice alone, heat alone, alternating between ice & heat.  Experiment to what works for you.  Swelling and bruising can take several weeks to resolve.   It is helpful to take an over-the-counter pain medication regularly for the first few weeks: Ibuprofen (Motrin/Advil) - 200mg tabs - take 3 tabs (600mg) every 6 hours as needed for pain (unless you have been directed previously to avoid NSAIDs/ibuprofen) Acetaminophen (Tylenol) - you may take 650mg every 6 hours as needed. You can take this with motrin as they act differently on the body. If you are taking a narcotic pain medication that has acetaminophen in it, do not take over the counter tylenol at the same time. NOTE: You may take both of these medications together - most patients  find it most helpful when alternating between the two (i.e. Ibuprofen at 6am, tylenol at 9am, ibuprofen at 12pm ...) A  prescription for pain medication should be given to you upon discharge.  Take your pain medication as  prescribed if your pain is not adequatly controlled with the over-the-counter pain reliefs mentioned above.  Avoid getting constipated.  Between the surgery and the pain medications, it is common to experience some constipation.  Increasing fluid intake and taking a fiber supplement (such as Metamucil, Citrucel, FiberCon, MiraLax, etc) 1-2 times a day regularly will usually help prevent this problem from occurring.  A mild laxative (prune juice, Milk of Magnesia, MiraLax, etc) should be taken according to package directions if there are no bowel movements after 48 hours.    Dressing: Your incisions are covered in Dermabond which is like sterile superglue for the skin. This will come off on it's own in a couple weeks. It is waterproof and you may bathe normally starting the day after your surgery in a shower. Avoid baths/pools/lakes/oceans until your wounds have fully healed.  ACTIVITIES as tolerated:   Avoid heavy lifting (>10lbs or 1 gallon of milk) for the next 6 weeks. You may resume regular daily activities as tolerated--such as daily self-care, walking, climbing stairs--gradually increasing activities as tolerated.  If you can walk 30 minutes without difficulty, it is safe to try more intense activity such as jogging, treadmill, bicycling, low-impact aerobics.  DO NOT PUSH THROUGH PAIN.  Let pain be your guide: If it hurts to do something, don't do it. You may drive when you are no longer taking prescription pain medication, you can comfortably wear a seatbelt, and you can safely maneuver your car and apply brakes.  FOLLOW UP in our   office Please call [facility] at [phone] to set up an appointment to see your surgeon in the office for a follow-up appointment approximately 2 weeks after your surgery. Make sure that you call for this appointment the day you arrive home to insure a convenient appointment time.  9. If you have disability or family leave forms that need to be completed, you may have  them completed by your primary care physician's office; for return to work instructions, please ask our office staff and they will be happy to assist you in obtaining this documentation   When to call us [phone]: Poor pain control Reactions / problems with new medications (rash/itching, etc)  Fever over 101.5 F (38.5 C) Inability to urinate Nausea/vomiting Worsening swelling or bruising Continued bleeding from incision. Increased pain, redness, or drainage from the incision  The clinic staff is available to answer your questions during regular business hours (8:30am-5pm).  Please don't hesitate to call and ask to speak to one of our nurses for clinical concerns.   A surgeon from [facility] is always on call at the hospitals   If you have a medical emergency, go to the nearest emergency room or call 911.  [facility] [address], [address], [place], [place]  [address] MAIN: [phone] FAX: [phone] [email or web address]  

## 2021-04-05 NOTE — H&P (Signed)
CC: Here today for surgery  HPI: Vickie Holloway is an 76 y.o. female with history of depression, HTN, whom was seen in the office as a referral by Dr. Sheppard Penton for evaluation of newly diagnosed colon cancer.  She had initially presented with some vague right lower quadrant pain and was concerned she may have appendicitis. She underwent CT scan in the emergency room with findings noted below of a possible mass.  Colonoscopy 02/24/21 by Dr. Silverio Decamp - demonstrated a 2.5 cm polypoid lesion in the cecum. Lateral spreading. Biopsies were taken. Infiltrative partially obstructing mass in the ascending colon. Partially circumferential. 3 cm in length. Biopsied and tattooed. Diverticulosis. Hemorrhoids.  Bx of cecal polyp showed TVA without dysplasia. Ascending colon mass demonstrated adenocarcinoma.  CT A/P 02/08/21 -this was done for right lower quadrant pain. Showed distal ascending colon with pericolic fat stranding and prominent but nonenlarged mesenteric lymph nodes. Findings suggestive of malignancy.  CT chest 03/05/2021 no evidence of metastatic disease.  Denies complaints at this time. She denies any nausea, vomiting, abdominal pain, fever, chills, or distention.  She denies any chest pain, shortness of breath. She is a former marathon runner. She reports that she can easily ascend 2 flights of stairs without difficulty and does this daily.  Denies any changes in her health or health history since we met in the office. States she is ready for surgery.   PMH: Depression, HTN  PSH: Transvaginal hysterectomy 1983  FHx: Denies any known family history of colorectal, breast, endometrial or ovarian cancer  Social Hx: Denies use of tobacco/EtOH/illicit drug. She is here today with her husband. One of her daughters is present via speaker phone  Review of Systems: A complete review of systems was obtained from the patient. I have reviewed this information and discussed as appropriate with  the patient. See HPI as well for other ROS.  Past Medical History:  Diagnosis Date   Allergy    Anxiety    Arthritis    Colon cancer ([place])    Depression    Hypertension    Insomnia     Past Surgical History:  Procedure Laterality Date   ABDOMINAL HYSTERECTOMY  03/22/1983   CATARACT EXTRACTION W/ INTRAOCULAR LENS IMPLANT Left    COLONOSCOPY     COSMETIC SURGERY  03/21/1997   face lift   DILATION AND CURETTAGE OF UTERUS     ORIF WRIST FRACTURE Left 01/01/2013   Procedure: OPEN REDUCTION INTERNAL FIXATION (ORIF)OF LEFT DISTAL RADIUS FRACTURE;  Surgeon: Cammie Sickle., MD;  Location: [facility];  Service: Orthopedics;  Laterality: Left;   REFRACTIVE SURGERY     TONSILLECTOMY      Family History  Problem Relation Age of Onset   Stroke Mother    Heart disease Mother    Pulmonary fibrosis Father    Colon cancer Neg Hx    Stomach cancer Neg Hx    Esophageal cancer Neg Hx    Pancreatic cancer Neg Hx     Social:  reports that she quit smoking about 42 years ago. Her smoking use included cigarettes. She has quit using smokeless tobacco. She reports current alcohol use. She reports that she does not use drugs.  Allergies: No Known Allergies  Medications: I have reviewed the patient's current medications.  Results for orders placed or performed during the hospital encounter of 04/05/21 (from the past 48 hour(s))  ABO/Rh     Status: None   Collection Time: 04/05/21  6:10 AM  Result Value Ref Range   ABO/RH(D)      O POS Performed at [facility] [address], [place], [place] [address]     No results found.  ROS - all of the below systems have been reviewed with the patient and positives are indicated with bold text General: chills, fever or night sweats Eyes: blurry vision or double vision ENT: epistaxis or sore throat Allergy/Immunology: itchy/watery eyes or nasal congestion Hematologic/Lymphatic: bleeding problems, blood  clots or swollen lymph nodes Endocrine: temperature intolerance or unexpected weight changes Breast: new or changing breast lumps or nipple discharge Resp: cough, shortness of breath, or wheezing CV: chest pain or dyspnea on exertion GI: as per HPI GU: dysuria, trouble voiding, or hematuria MSK: joint pain or joint stiffness Neuro: TIA or stroke symptoms Derm: pruritus and skin lesion changes Psych: anxiety and depression  PE Blood pressure 136/77, pulse 74, temperature 97.9 F (36.6 C), temperature source Oral, resp. rate 16, height _0  (1.651 m), weight 53.9 kg, SpO2 100 %. Constitutional: NAD; conversant Eyes: Moist conjunctiva; no lid lag; anicteric Neck: Trachea midline; no thyromegaly Lungs: Normal respiratory effort; no tactile fremitus CV: RRR; no palpable thrills; no pitting edema GI: Abd soft, NT/ND; no palpable hepatosplenomegaly MSK: Normal range of motion of extremities Psychiatric: Appropriate affect; alert and oriented x3  Results for orders placed or performed during the hospital encounter of 04/05/21 (from the past 48 hour(s))  ABO/Rh     Status: None   Collection Time: 04/05/21  6:10 AM  Result Value Ref Range   ABO/RH(D)      O POS Performed at [facility], [place] [address] [place], [place] [address]     No results found.  A/P: Vickie Holloway is an 76 y.o. female with hx of HTN, depression here for evaluation of ascending colon cancer and large cecal polyp.  CT CAP show no evidence of metastatic disease  -The anatomy and physiology of the GI tract was reviewed with the patient. The pathophysiology of colon polyps and cancer was discussed as well with associated pictures. -We have discussed various different treatment options going forward including surgery (the most definitive) to address this - laparoscopic right hemicolectomy -The planned procedure, material risks (including, but not limited to, pain, bleeding, infection, scarring,  need for blood transfusion, damage to surrounding structures- blood vessels/nerves/viscus/organs, damage to ureter, leak from anastomosis, need for additional procedures, scenarios where a stoma may be necessary and where it may be permanent, worsening of pre-existing medical conditions, hernia, recurrence, pneumonia, heart attack, stroke, death) benefits and alternatives to surgery were discussed at length. The patient's questions were answered to her satisfaction, she voiced understanding and elected to proceed with surgery. Additionally, we discussed typical postoperative expectations and the recovery process.  Nadeen Landau, MD [facility], [facility]

## 2021-04-05 NOTE — Op Note (Signed)
PATIENT: Vickie Holloway  76 y.o. female  Patient Care Team: [name], MD as PCP - General (Family Medicine)  PREOP DIAGNOSIS: Colon cancer+polyp  POSTOP DIAGNOSIS: Same  PROCEDURE: Laparoscopic Holloway hemicolectomy  SURGEON: Sharon Mt. Dema Severin, MD  ASSISTANT: Radonna Ricker MD  ANESTHESIA: General endotracheal  EBL: 20 mL Total I/O In: 1100 [I.V.:1000; IV Piggyback:100] Out: 75 [Urine:50; Blood:25]  SPECIMEN: Holloway colon (including terminal ileum, appendix, ascending colon, proximal transverse colon)  COUNTS: Sponge, needle and instrument counts were reported correct x2  FINDINGS:  Tattoo in ascending colon. Associated colonic mass. No evident metastatic disease on visceral parietal peritoneum or liver. Holloway branch of middle colic included with the specimen. A well perfused ileocolic anastomosis was fashioned to the mid transverse colon. Palpable pulse in main middle colic vessel remaining.  NARRATIVE:  The patient was identified & brought into the operating room, placed supine on the operating table and SCDs were applied to the lower extremities. General endotracheal anesthesia was induced. The patient was positioned supine with arms tucked. Antibiotics were administered. A foley catheter was placed under sterile conditions. Hair in the region of planned surgery was clipped. The abdomen was prepped and draped in a sterile fashion. A timeout was performed confirming our patient and plan.   Beginning with the extraction port, a supraumbilical incision was made and carried down to the midline fascia. This was then incised with electrocautery. The peritoneum was identified and elevated between clamps and carefully opened sharply. A small Alexis wound protector with a cap and associated port was then placed. The abdomen was insufflated to 15 mmHg with Co2. A laparoscope was placed and camera inspection revealed no evidence of injury. Bilateral TAP blocks were then performed under  laparoscopic visualization using a mixture of 0.25% marcaine with epinepherine + Exparel. 3 additional ports were then placed under direct laparoscopic visualization - two in the left hemiabdomen and one in the Holloway abdomen. The abdomen was surveyed. The liver and peritoneum appeared normal.  There were no signs of metastatic disease. A tattoo is evident in the ascending colon with associated mass but no evident extension beyond the colon.  The patient was positioned in trendelenburg with left side down. The ileocolic pedicle was identified.  A medial to lateral approach was utilized first.  The ileocolic pedicle was elevated.  The peritoneum overlying this was incised.  While working just behind this, the retroperitoneal plane was developed.  The duodenum was identified and swept "down."  The ascending mesocolon was carefully dissected from the retroperitoneum all the way up to the hepatic flexure.  The duodenum had been fully mobilized away from the mesentery.  The ileocolic pedicle was circumferentially dissected.  This was then ligated and divided using the Enseal device.  The pedicle was inspected and noted to be hemostatic.   The lateral approach was then developed by incising the Greta Yung line of Toldt all the way up to the hepatic flexure.  Hepatic flexure was partially mobilized from this approach as well.  The patient was then repositioned in reverse Trendelenburg.  The omentum was elevated anteriorly and the transverse colon retracted caudad.  The hepatic flexure was then fully mobilized from this approach as well.  The entire colon was able to be flipped medially and there was adequate mobilization.  At this point, the abdomen was desufflated and the terminal ileum and Holloway colon delivered through the wound protector. The terminal ileum was then transected using a GIA blue load stapler.  A Kary Kos was  placed on the distal ileum stay side to assist in maintaining orientation.  The transverse colon  was also transected in a similar manner at a location that includes the Holloway branch of the middle colic but preserve the middle colic pedicle to preserve our remaining transverse colon.  The intervening mesentery was then ligated using the Enseal device.  The mesenteric edges were inspected noted to be hemostatic.  The remaining mesentery was divided using the Enseal device. The divided mesentery was inspected and noted to be hemostatic. The specimen was then passed off.  Attention was turned to creating the anastomosis. The distasl ileum and transverse colon were inspected for orientation to ensure no twisting nor bowel included in the mesenteric defect. An anastomosis was created between the terminal ileum and the transverse colon using a 75 mm GIA blue load stapler. The staple line was inspected and noted to be hemostatic.  The common enterotomy channel was closed using a TA 90 blue load stapler. 2-0 silk sutures were used to imbricate the corners of the staple line as well.  A 2-0 silk suture was placed securing the apex of the anastomosis. The anastomosis was palpated and noted to be widely patent, 3 fingerbreadths. This was then placed back into the abdomen.   The abdomen was then irrigated with sterile saline and hemostasis verified. The omentum was then brought down over the anastomosis. The wound protector cap was replaced and CO2 reinsufflated. The laparoscopic ports were removed under direct visualization and the sites noted to be hemostatic. The Alexis wound protector was removed, counts were reported correct, and we switched to clean instruments, gowns and drapes.   The fascia was then closed using two running #1 PDS sutures.  The skin of all incision sites was closed with 4-0 monocryl subcuticular suture. Dermabond was placed on the port sites and a sterile dressing was placed over the abdominal incision. All counts were reported correct. She was then awakened from anesthesia and sent to the  PACU in satisfactory condition.   DISPOSITION: PACU in satisfactory condition

## 2021-04-05 NOTE — Anesthesia Postprocedure Evaluation (Signed)
Anesthesia Post Note  Patient: Vickie Holloway  Procedure(s) Performed: LAPAROSCOPIC Holloway HEMI COLECTOMY (Holloway: Abdomen)     Patient location during evaluation: PACU Anesthesia Type: General Level of consciousness: awake and alert Pain management: pain level controlled Vital Signs Assessment: post-procedure vital signs reviewed and stable Respiratory status: spontaneous breathing, nonlabored ventilation and respiratory function stable Cardiovascular status: blood pressure returned to baseline and stable Postop Assessment: no apparent nausea or vomiting Anesthetic complications: no   No notable events documented.  Last Vitals:  Vitals:   04/05/21 1045 04/05/21 1100  BP:  136/70  Pulse: 82 78  Resp: 14 12  Temp:    SpO2: 100% 99%    Last Pain:  Vitals:   04/05/21 1100  TempSrc:   PainSc: 0-No pain                 Melessa Cowell,W. EDMOND

## 2021-04-05 NOTE — Anesthesia Procedure Notes (Signed)
Procedure Name: Intubation Date/Time: 04/05/2021 7:48 AM Performed by: Lollie Sails, CRNA Pre-anesthesia Checklist: Patient identified, Emergency Drugs available, Suction available, Patient being monitored and Timeout performed Patient Re-evaluated:Patient Re-evaluated prior to induction Oxygen Delivery Method: Circle system utilized Preoxygenation: Pre-oxygenation with 100% oxygen Induction Type: IV induction Ventilation: Mask ventilation without difficulty Laryngoscope Size: 3 and Glidescope Grade View: Grade III Tube type: Oral Tube size: 7.0 mm Number of attempts: 2 Airway Equipment and Method: Stylet Placement Confirmation: ETT inserted through vocal cords under direct vision, positive ETCO2 and breath sounds checked- equal and bilateral Secured at: 22 cm Tube secured with: Tape Dental Injury: Teeth and Oropharynx as per pre-operative assessment  Difficulty Due To: Difficulty was anticipated and Difficult Airway- due to limited oral opening Comments: DL by CRNA with Sabra Heck 3 - minimal view of epiglottis - attempt with Bougie but unsuccessful.   Switch to Glidescope with full view of cords.  Endotube passed smoothly.

## 2021-04-06 ENCOUNTER — Encounter (HOSPITAL_COMMUNITY): Payer: Self-pay | Admitting: Surgery

## 2021-04-06 LAB — CBC
HCT: 29.1 % — ABNORMAL LOW (ref 36.0–46.0)
Hemoglobin: 9.2 g/dL — ABNORMAL LOW (ref 12.0–15.0)
MCH: 28.5 pg (ref 26.0–34.0)
MCHC: 31.6 g/dL (ref 30.0–36.0)
MCV: 90.1 fL (ref 80.0–100.0)
Platelets: 220 10*3/uL (ref 150–400)
RBC: 3.23 MIL/uL — ABNORMAL LOW (ref 3.87–5.11)
RDW: 13.8 % (ref 11.5–15.5)
WBC: 7.9 10*3/uL (ref 4.0–10.5)
nRBC: 0 % (ref 0.0–0.2)

## 2021-04-06 LAB — BASIC METABOLIC PANEL
Anion gap: 3 — ABNORMAL LOW (ref 5–15)
BUN: 11 mg/dL (ref 8–23)
CO2: 29 mmol/L (ref 22–32)
Calcium: 8.2 mg/dL — ABNORMAL LOW (ref 8.9–10.3)
Chloride: 105 mmol/L (ref 98–111)
Creatinine, Ser: 0.63 mg/dL (ref 0.44–1.00)
GFR, Estimated: >60 mL/min (ref >60)
Glucose, Bld: 112 mg/dL — ABNORMAL HIGH (ref 70–99)
Potassium: 3.3 mmol/L — ABNORMAL LOW (ref 3.5–5.1)
Sodium: 137 mmol/L (ref 135–145)

## 2021-04-06 MED ORDER — FERROUS SULFATE 325 (65 FE) MG PO TABS
325.0000 mg | ORAL_TABLET | Freq: Three times a day (TID) | ORAL | Status: DC
  Administered 2021-04-06 – 2021-04-07 (×5): 325 mg via ORAL
  Filled 2021-04-06 (×5): qty 1

## 2021-04-06 MED ORDER — ASCORBIC ACID 500 MG PO TABS
250.0000 mg | ORAL_TABLET | Freq: Three times a day (TID) | ORAL | Status: DC
  Administered 2021-04-06 – 2021-04-07 (×6): 250 mg via ORAL
  Filled 2021-04-06 (×6): qty 1

## 2021-04-06 MED ORDER — ZOLPIDEM TARTRATE 5 MG PO TABS
5.0000 mg | ORAL_TABLET | Freq: Every evening | ORAL | Status: DC | PRN
  Administered 2021-04-06 – 2021-04-07 (×2): 5 mg via ORAL
  Filled 2021-04-06 (×2): qty 1

## 2021-04-06 NOTE — Progress Notes (Signed)
°  Subjective No acute events. Feeling quite well. Ate 2 popsickles, drank couple cups/bottle water. No n/v. Pain well controlled. Ambulating.  Objective: Vital signs in last 24 hours: Temp:  [97.6 F (36.4 C)-98.8 F (37.1 C)] 98.4 F (36.9 C) (01/17 0627) Pulse Rate:  [74-86] 74 (01/17 0627) Resp:  [12-20] 18 (01/17 0627) BP: (94-154)/(50-97) 113/62 (01/17 0627) SpO2:  [95 %-100 %] 95 % (01/17 0627) Last BM Date: 04/04/21  Intake/Output from previous day: 01/16 0701 - 01/17 0700 In: 3255.4 [P.O.:660; I.V.:2495.4; IV Piggyback:100] Out: 2275 [Urine:2250; Blood:25] Intake/Output this shift: No intake/output data recorded.  Gen: NAD, comfortable CV: RRR Pulm: Normal work of breathing Abd: Soft, NT/ND. Incisions c/d/I. Ext: SCDs in place  Lab Results: CBC  Recent Labs    04/05/21 1227 04/06/21 0353  WBC 8.5 7.9  HGB 10.6* 9.2*  HCT 34.0* 29.1*  PLT 252 220   BMET Recent Labs    04/05/21 1227 04/06/21 0353  NA  --  137  K  --  3.3*  CL  --  105  CO2  --  29  GLUCOSE  --  112*  BUN  --  11  CREATININE 0.70 0.63  CALCIUM  --  8.2*   PT/INR No results for input(s): LABPROT, INR in the last 72 hours. ABG No results for input(s): PHART, HCO3 in the last 72 hours.  Invalid input(s): PCO2, PO2  Studies/Results:  Anti-infectives: Anti-infectives (From admission, onward)    Start     Dose/Rate Route Frequency Ordered Stop   04/05/21 0600  cefoTEtan (CEFOTAN) 2 g in sodium chloride 0.9 % 100 mL IVPB        2 g 200 mL/hr over 30 Minutes Intravenous On call to O.R. 04/05/21 0525 04/05/21 0810        Assessment/Plan: Patient Active Problem List   Diagnosis Date Noted   S/P right hemicolectomy 04/05/2021   s/p Procedure(s): LAPAROSCOPIC RIGHT HEMI COLECTOMY 04/05/2021  -Doing great -D/C IVF, D/C Foley -Acute blood loss anemia + chronic anemia, presumed 2/2 chronic loss from her colon cancer. Will start TID iron + vitC -Ambulate 5x/day -Adv to  soft -Ppx: SQH, SCDs  We spent time reviewing her procedure, findings and plan of care. All of her questions have been answered and she has expressed understanding.   LOS: 1 day   Nadeen Landau, MD [facility], [place]

## 2021-04-07 ENCOUNTER — Other Ambulatory Visit (HOSPITAL_COMMUNITY): Payer: Self-pay

## 2021-04-07 LAB — BASIC METABOLIC PANEL
Anion gap: 3 — ABNORMAL LOW (ref 5–15)
BUN: 10 mg/dL (ref 8–23)
CO2: 29 mmol/L (ref 22–32)
Calcium: 8.3 mg/dL — ABNORMAL LOW (ref 8.9–10.3)
Chloride: 105 mmol/L (ref 98–111)
Creatinine, Ser: 0.62 mg/dL (ref 0.44–1.00)
GFR, Estimated: >60 mL/min (ref >60)
Glucose, Bld: 95 mg/dL (ref 70–99)
Potassium: 3.1 mmol/L — ABNORMAL LOW (ref 3.5–5.1)
Sodium: 137 mmol/L (ref 135–145)

## 2021-04-07 LAB — CBC
HCT: 31.8 % — ABNORMAL LOW (ref 36.0–46.0)
Hemoglobin: 9.8 g/dL — ABNORMAL LOW (ref 12.0–15.0)
MCH: 28 pg (ref 26.0–34.0)
MCHC: 30.8 g/dL (ref 30.0–36.0)
MCV: 90.9 fL (ref 80.0–100.0)
Platelets: 253 10*3/uL (ref 150–400)
RBC: 3.5 MIL/uL — ABNORMAL LOW (ref 3.87–5.11)
RDW: 13.9 % (ref 11.5–15.5)
WBC: 6 10*3/uL (ref 4.0–10.5)
nRBC: 0 % (ref 0.0–0.2)

## 2021-04-07 MED ORDER — POTASSIUM CHLORIDE 10 MEQ/100ML IV SOLN
10.0000 meq | INTRAVENOUS | Status: AC
  Administered 2021-04-07 (×4): 10 meq via INTRAVENOUS
  Filled 2021-04-07 (×4): qty 100

## 2021-04-07 MED ORDER — POTASSIUM CHLORIDE CRYS ER 20 MEQ PO TBCR
40.0000 meq | EXTENDED_RELEASE_TABLET | Freq: Once | ORAL | Status: AC
  Administered 2021-04-07: 40 meq via ORAL
  Filled 2021-04-07: qty 2

## 2021-04-07 NOTE — Progress Notes (Signed)
°  Subjective No acute events. Feeling well. Tolerating soft diet without n/v. Ambulating. Pain well controlled. Multiple bouts of passing flatus.  Objective: Vital signs in last 24 hours: Temp:  [98 F (36.7 C)-99.4 F (37.4 C)] 99.4 F (37.4 C) (01/18 0526) Pulse Rate:  [71-75] 75 (01/18 0526) Resp:  [16] 16 (01/18 0526) BP: (128-144)/(69-82) 144/82 (01/18 0526) SpO2:  [94 %-99 %] 94 % (01/18 0526) Last BM Date: 04/04/21  Intake/Output from previous day: 01/17 0701 - 01/18 0700 In: 960 [P.O.:960] Out: 1600 [Urine:1600] Intake/Output this shift: No intake/output data recorded.  Gen: NAD, comfortable CV: RRR Pulm: Normal work of breathing Abd: Soft, NT/ND. Incisions c/d/I without erythema nor drainage. Ext: SCDs in place  Lab Results: CBC  Recent Labs    04/06/21 0353 04/07/21 0354  WBC 7.9 6.0  HGB 9.2* 9.8*  HCT 29.1* 31.8*  PLT 220 253   BMET Recent Labs    04/06/21 0353 04/07/21 0354  NA 137 137  K 3.3* 3.1*  CL 105 105  CO2 29 29  GLUCOSE 112* 95  BUN 11 10  CREATININE 0.63 0.62  CALCIUM 8.2* 8.3*   PT/INR No results for input(s): LABPROT, INR in the last 72 hours. ABG No results for input(s): PHART, HCO3 in the last 72 hours.  Invalid input(s): PCO2, PO2  Studies/Results:  Anti-infectives: Anti-infectives (From admission, onward)    Start     Dose/Rate Route Frequency Ordered Stop   04/05/21 0600  cefoTEtan (CEFOTAN) 2 g in sodium chloride 0.9 % 100 mL IVPB        2 g 200 mL/hr over 30 Minutes Intravenous On call to O.R. 04/05/21 0525 04/05/21 0810        Assessment/Plan: Patient Active Problem List   Diagnosis Date Noted   S/P right hemicolectomy 04/05/2021   s/p Procedure(s): LAPAROSCOPIC RIGHT HEMI COLECTOMY 04/05/2021  -Continues to do great -Acute blood loss anemia + chronic anemia, presumed 2/2 chronic loss from her colon cancer. Continue TID iron + vitC -Ambulate 5x/day -Hypokalemia - expected in postop setting following  bowel prep, etc. Now that she is on soft, will replace today but moving forward should be fine with oral intake -Continue soft diet -Ppx: SQH, SCDs  We spent time reviewing postoperative expectations today and plans moving forward. If doing well later today, tolerating diet, feeling well, and remains comfortable with going home, possible discharge.   LOS: 2 days   Nadeen Landau, MD [facility], [place]

## 2021-04-07 NOTE — Progress Notes (Signed)
Transition of Care [facility]) Screening Note  Patient Details  Name: Vickie Holloway Date of Birth: 1945-06-03  Transition of Care [facility]) CM/SW Contact:    Sherie Don, LCSW Phone Number: 04/07/2021, 10:40 AM  Transition of Care Department [facility]) has reviewed patient and no TOC needs have been identified at this time. We will continue to monitor patient advancement through interdisciplinary progression rounds. If new patient transition needs arise, please place a TOC consult.

## 2021-04-07 NOTE — Progress Notes (Signed)
°   04/07/21 1200  Mobility  Activity Ambulated with assistance in hallway  Level of Assistance Modified independent, requires aide device or extra time  Assistive Device None  Distance Ambulated (ft) 400 ft  Activity Response Tolerated well  $Mobility charge 1 Mobility   Pt agreeable to mobilize this morning. Ambulated about 468ft in hall, tolerated well. No complaints. Left pt in bed with call bell at side.   [place] Specialist Acute Rehab Services Office: [phone]

## 2021-04-08 ENCOUNTER — Other Ambulatory Visit: Payer: Self-pay

## 2021-04-08 ENCOUNTER — Other Ambulatory Visit (HOSPITAL_COMMUNITY): Payer: Self-pay

## 2021-04-08 LAB — BASIC METABOLIC PANEL
Anion gap: 7 (ref 5–15)
BUN: 12 mg/dL (ref 8–23)
CO2: 26 mmol/L (ref 22–32)
Calcium: 8.6 mg/dL — ABNORMAL LOW (ref 8.9–10.3)
Chloride: 106 mmol/L (ref 98–111)
Creatinine, Ser: 0.52 mg/dL (ref 0.44–1.00)
GFR, Estimated: >60 mL/min (ref >60)
Glucose, Bld: 110 mg/dL — ABNORMAL HIGH (ref 70–99)
Potassium: 3.9 mmol/L (ref 3.5–5.1)
Sodium: 139 mmol/L (ref 135–145)

## 2021-04-08 MED ORDER — FERROUS SULFATE 325 (65 FE) MG PO TABS
325.0000 mg | ORAL_TABLET | Freq: Two times a day (BID) | ORAL | 0 refills | Status: DC
  Filled 2021-04-08: qty 28, 14d supply, fill #0

## 2021-04-08 NOTE — Progress Notes (Signed)
Discharge instructions discussed with patient and family, verbalized agreement and understanding 

## 2021-04-08 NOTE — Progress Notes (Signed)
°  Subjective No acute events. Feeling well. Tolerating diet without n/v - ate [employer] with milkshake. Ambulating. Pain well controlled. Requesting to go home today  Objective: Vital signs in last 24 hours: Temp:  [97.9 F (36.6 C)-98.5 F (36.9 C)] 98.3 F (36.8 C) (01/19 0439) Pulse Rate:  [66-69] 66 (01/19 0439) Resp:  [16-18] 18 (01/19 0439) BP: (124-130)/(72-81) 124/72 (01/19 0439) SpO2:  [95 %-97 %] 95 % (01/19 0439) Weight:  [58 kg] 58 kg (01/19 0500) Last BM Date: 04/04/21  Intake/Output from previous day: 01/18 0701 - 01/19 0700 In: 2956 [P.O.:1203; IV Piggyback:373] Out: 1100 [Urine:1100] Intake/Output this shift: No intake/output data recorded.  Gen: NAD, comfortable CV: RRR Pulm: Normal work of breathing Abd: Soft, NT/ND. Incisions c/d/I without erythema nor drainage. Ext: SCDs in place  Lab Results: CBC  Recent Labs    04/06/21 0353 04/07/21 0354  WBC 7.9 6.0  HGB 9.2* 9.8*  HCT 29.1* 31.8*  PLT 220 253   BMET Recent Labs    04/07/21 0354 04/08/21 0344  NA 137 139  K 3.1* 3.9  CL 105 106  CO2 29 26  GLUCOSE 95 110*  BUN 10 12  CREATININE 0.62 0.52  CALCIUM 8.3* 8.6*   PT/INR No results for input(s): LABPROT, INR in the last 72 hours. ABG No results for input(s): PHART, HCO3 in the last 72 hours.  Invalid input(s): PCO2, PO2  Studies/Results:  Anti-infectives: Anti-infectives (From admission, onward)    Start     Dose/Rate Route Frequency Ordered Stop   04/05/21 0600  cefoTEtan (CEFOTAN) 2 g in sodium chloride 0.9 % 100 mL IVPB        2 g 200 mL/hr over 30 Minutes Intravenous On call to O.R. 04/05/21 0525 04/05/21 0810        Assessment/Plan: Patient Active Problem List   Diagnosis Date Noted   S/P right hemicolectomy 04/05/2021   s/p Procedure(s): LAPAROSCOPIC RIGHT HEMI COLECTOMY 04/05/2021  -Continues to do great -Acute blood loss anemia + chronic anemia, presumed 2/2 chronic loss from her colon cancer. Continue TID  iron + vitC -Ambulate 5x/day -Hypokalemia - resolved -Diet as tolerated -Ppx: SQH, SCDs  -She is comfortable with and stable for discharge home today  We spent time reviewing postoperative expectations today and plans moving forward. If doing well later today, tolerating diet, feeling well, and remains comfortable with going home, possible discharge.   LOS: 3 days   Nadeen Landau, MD [facility], [place]

## 2021-04-08 NOTE — Discharge Summary (Signed)
Patient ID: Vickie Holloway MRN: [id number] DOB/AGE: 08-25-1945 76 y.o.  Admit date: 04/05/2021 Discharge date: 04/08/2021  Discharge Diagnoses Patient Active Problem List   Diagnosis Date Noted   S/P right hemicolectomy 04/05/2021    Consultants None  Procedures Laparoscopic right hemicolectomy 04/05/21  Hospital Course: She was admitted postoperatively and recovered appropriately. Her diet was gradually advanced which she tolerated well. She began having flatus on POD#2 and later that day began having bowel movements. She was mobilizing well on her own and pain was controlled with oral analgesics.  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 She had 1 of 36 lymph nodes involved. Margins negative. No LVI/PNI. pT3N1a --> Stage III colon cancer. I have attempted to reach her at her home number, mobile and additionally on her husbands mobile. I have been unable to reach her thus far but we will continue to try.  A referral to medical oncology for consideration of adjuvant therapy has been placed by my office.    Allergies as of 04/08/2021   No Known Allergies      Medication List     STOP taking these medications    amoxicillin-clavulanate 875-125 MG tablet Commonly known as: AUGMENTIN       TAKE these medications    FeroSul 325 (65 FE) MG tablet Generic drug: ferrous sulfate Take 1 tablet (325 mg total) by mouth 2 (two) times daily with a meal for 14 days.   HAIR SKIN AND NAILS FORMULA PO Take 3 tablets by mouth daily.   lamoTRIgine 200 MG tablet Commonly known as: LAMICTAL Take 200 mg by mouth daily.   Magnesium 400 MG Tabs Take 400 mg by mouth daily.   olmesartan 20 MG tablet Commonly known as: BENICAR Take 20 mg by mouth daily.   sertraline 50 MG tablet Commonly known as: ZOLOFT Take 1 tablet (50 mg total) by mouth daily.   traMADol 50 MG tablet Commonly known as:  Ultram Take 1 tablet  by mouth every 6 hours as needed for up to 5 days (postop pain not controlled with tylenol/ibuprofen first).   VITAMIN C PO Take 2 tablets by mouth daily.   VITAMIN D PO Take 2,000 Units by mouth daily.   ZINC PO Take 1 tablet by mouth daily.          Follow-up Information     Ileana Roup, MD Follow up in 2 week(s).   Specialties: General Surgery, Colon and Rectal Surgery Why: 2-3 weeks for postop follow-up Contact information: [place] [id number] [phone]                 Adalynd Donahoe M. Dema Severin, M.D. [facility]

## 2021-04-09 LAB — SURGICAL PATHOLOGY

## 2021-04-13 ENCOUNTER — Telehealth: Payer: Self-pay | Admitting: Hematology

## 2021-04-13 NOTE — Telephone Encounter (Signed)
Scheduled appt per 1/23 referral. Pt is aware of appt date and time. Pt is aware to arrive 15 mins prior to appt.

## 2021-04-13 NOTE — Telephone Encounter (Signed)
R/s pt's appt due to an error in scheduling. Called pt, no answer. Left msg with new appt date and time. Requested for pt to call back to confirm this appt change.

## 2021-04-21 ENCOUNTER — Ambulatory Visit: Payer: Medicare Other | Admitting: Hematology

## 2021-04-23 ENCOUNTER — Inpatient Hospital Stay (HOSPITAL_BASED_OUTPATIENT_CLINIC_OR_DEPARTMENT_OTHER): Payer: Medicare Other | Admitting: Hematology

## 2021-04-23 ENCOUNTER — Telehealth: Payer: Self-pay | Admitting: Pharmacist

## 2021-04-23 ENCOUNTER — Inpatient Hospital Stay: Payer: Medicare Other | Attending: Hematology | Admitting: Hematology

## 2021-04-23 ENCOUNTER — Telehealth: Payer: Self-pay

## 2021-04-23 ENCOUNTER — Other Ambulatory Visit: Payer: Self-pay

## 2021-04-23 ENCOUNTER — Encounter: Payer: Self-pay | Admitting: Hematology

## 2021-04-23 ENCOUNTER — Other Ambulatory Visit (HOSPITAL_COMMUNITY): Payer: Self-pay

## 2021-04-23 DIAGNOSIS — Z809 Family history of malignant neoplasm, unspecified: Secondary | ICD-10-CM | POA: Diagnosis not present

## 2021-04-23 DIAGNOSIS — Z87891 Personal history of nicotine dependence: Secondary | ICD-10-CM | POA: Insufficient documentation

## 2021-04-23 DIAGNOSIS — I10 Essential (primary) hypertension: Secondary | ICD-10-CM | POA: Diagnosis not present

## 2021-04-23 DIAGNOSIS — C182 Malignant neoplasm of ascending colon: Secondary | ICD-10-CM | POA: Insufficient documentation

## 2021-04-23 DIAGNOSIS — Z90710 Acquired absence of both cervix and uterus: Secondary | ICD-10-CM | POA: Diagnosis not present

## 2021-04-23 DIAGNOSIS — F418 Other specified anxiety disorders: Secondary | ICD-10-CM | POA: Insufficient documentation

## 2021-04-23 LAB — CBC WITH DIFFERENTIAL/PLATELET
Abs Immature Granulocytes: 0.01 10*3/uL (ref 0.00–0.07)
Basophils Absolute: 0.1 10*3/uL (ref 0.0–0.1)
Basophils Relative: 1 %
Eosinophils Absolute: 0 10*3/uL (ref 0.0–0.5)
Eosinophils Relative: 1 %
HCT: 40.6 % (ref 36.0–46.0)
Hemoglobin: 12.4 g/dL (ref 12.0–15.0)
Immature Granulocytes: 0 %
Lymphocytes Relative: 15 %
Lymphs Abs: 0.8 10*3/uL (ref 0.7–4.0)
MCH: 27.7 pg (ref 26.0–34.0)
MCHC: 30.5 g/dL (ref 30.0–36.0)
MCV: 90.8 fL (ref 80.0–100.0)
Monocytes Absolute: 0.4 10*3/uL (ref 0.1–1.0)
Monocytes Relative: 9 %
Neutro Abs: 3.8 10*3/uL (ref 1.7–7.7)
Neutrophils Relative %: 74 %
Platelets: 409 10*3/uL — ABNORMAL HIGH (ref 150–400)
RBC: 4.47 MIL/uL (ref 3.87–5.11)
RDW: 14.7 % (ref 11.5–15.5)
WBC: 5.1 10*3/uL (ref 4.0–10.5)
nRBC: 0 % (ref 0.0–0.2)

## 2021-04-23 LAB — COMPREHENSIVE METABOLIC PANEL
ALT: 29 U/L (ref 0–44)
AST: 28 U/L (ref 15–41)
Albumin: 4.5 g/dL (ref 3.5–5.0)
Alkaline Phosphatase: 99 U/L (ref 38–126)
Anion gap: 6 (ref 5–15)
BUN: 13 mg/dL (ref 8–23)
CO2: 29 mmol/L (ref 22–32)
Calcium: 9.7 mg/dL (ref 8.9–10.3)
Chloride: 105 mmol/L (ref 98–111)
Creatinine, Ser: 0.77 mg/dL (ref 0.44–1.00)
GFR, Estimated: >60 mL/min (ref >60)
Glucose, Bld: 90 mg/dL (ref 70–99)
Potassium: 4.4 mmol/L (ref 3.5–5.1)
Sodium: 140 mmol/L (ref 135–145)
Total Bilirubin: 0.4 mg/dL (ref 0.3–1.2)
Total Protein: 7.5 g/dL (ref 6.5–8.1)

## 2021-04-23 LAB — CEA (IN HOUSE-CHCC): CEA (CHCC-In House): 1.13 ng/mL (ref 0.00–5.00)

## 2021-04-23 LAB — FERRITIN: Ferritin: 39 ng/mL (ref 11–307)

## 2021-04-23 MED ORDER — CAPECITABINE 500 MG PO TABS
1000.0000 mg/m2 | ORAL_TABLET | Freq: Two times a day (BID) | ORAL | 0 refills | Status: DC
  Filled 2021-04-23: qty 84, 14d supply, fill #0
  Filled 2021-04-26: qty 84, 21d supply, fill #0

## 2021-04-23 NOTE — Progress Notes (Signed)
[place]   Telephone:([phone] Fax:([phone]) [place] Note   Patient Care Team: [name], MD as PCP - General (Family Medicine)  Date of Service:  04/23/2021   CHIEF COMPLAINTS/PURPOSE OF CONSULTATION:  Colon Cancer  REFERRING PHYSICIAN:  Dr. Dema Severin  ASSESSMENT & PLAN:  Vickie Holloway is a 76 y.o.  female with past medical history of anxiety and depression  1. Cancer of Right Colon, Stage IIIB p(T3, N1aM0), MSI-S -presented to ED with RLQ pain on 02/08/21. Colonoscopy on 02/24/21 showed a partially-obstructing tumor in ascending colon. Pathology showed adenocarcinoma. Staging chest CT on 03/05/21 was negative. -right hemi-colectomy on 04/05/21 with Dr. Dema Severin showed 4.7 cm invasive moderately differentiated adenocarcinoma. Margins were negative, one lymph node was positive (1/36). -I reviewed the work up and pathology results with them today. Given the positive lymph node and stage III disease, I recommend adjuvant chemo treatment with either 6 months of oral Xeloda or 3 months of FOLFOX.  Due to her advanced age and only one positive node, I think the benefit of oxaliplatin is small.  She would prefer to avoid IV treatments and is agreeable to try Xeloda. --Chemotherapy consent: Side effects including but does not not limited to, fatigue, nausea, vomiting, diarrhea, hair loss, neuropathy, fluid retention, renal and kidney dysfunction, neutropenic fever, needed for blood transfusion, bleeding, skin toxicity especially in the palm and bottom of feet, were discussed with patient in great detail. She agrees to proceed. We will plan to start on 05/03/21. -we will check baseline labs today.  2. Anxiety and depression -continue meds  -I offered counseling with SW    PLAN:  -proceed to lab today -I called in Xeloda 1513m q12h on day 1-14 every 21 days, plan to start 2/13 -phone visit week of 2/20 for toxicity check -lab and f/u on 2/27 before cycle  2    Oncology History Overview Note   Cancer Staging  Cancer of right colon ([facility] Staging form: Colon and Rectum, AJCC 8th Edition - Pathologic stage from 04/05/2021: Stage IIIB (pT3, pN1a, cM0) - Signed by FTruitt Merle MD on 04/23/2021    Cancer of right colon ([facility]  02/08/2021 Imaging   CLINICAL DATA:  Right lower quadrant abdominal pain. Nausea and fever.   EXAM: CT ABDOMEN AND PELVIS WITH CONTRAST  IMPRESSION: 1. Distal ascending colon with associated pericolonic fat stranding and prominent but nonenlarged mesenteric lymph nodes. Findings suggestive of malignancy. Differential diagnosis of infection. Recommend colonoscopy after course of infection treatment to exclude malignancy. 2. Tiny hiatal hernia. 3. Nonobstructive punctate left nephrolithiasis.   02/24/2021 Procedure   Colonoscopy, Dr. NSilverio Decamp Impression: - Rule out malignancy, polypoid lesion in the cecum. Biopsied. - Likely malignant partially obstructing tumor in the ascending colon. Biopsied. Tattooed. - Diverticulosis in the sigmoid colon. - Non-bleeding external and internal hemorrhoids.   02/24/2021 Initial Biopsy   Diagnosis 1. Cecum Biopsy, Mass - FRAGMENTS OF TUBULOVILLOUS ADENOMA. - NO HIGH GRADE DYSPLASIA OR INVASIVE CARCINOMA. 2. Ascending Colon Biopsy, Mass - ADENOCARCINOMA.   03/05/2021 Imaging   EXAM: CT CHEST WITH CONTRAST  IMPRESSION: No evidence of metastatic disease in the chest.   Heterogeneous, enlarged thyroid with multiple nodules measuring up to 2.2 cm. Recommend thyroid [place](ref: J Am Coll Radiol. 2015 Feb;12(2): 143-50).   Coronary artery disease.   Aortic Atherosclerosis (ICD10-I70.0).   04/05/2021 Cancer Staging   Staging form: Colon and Rectum, AJCC 8th Edition - Pathologic stage from 04/05/2021: Stage IIIB (  pT3, pN1a, cM0) - Signed by Truitt Merle, MD on 04/23/2021 Total positive nodes: 1 Histologic grading system: 4 grade system Histologic grade (G): G2 Residual tumor (R): R0  - None    04/05/2021 Definitive Surgery   FINAL MICROSCOPIC DIAGNOSIS:   A. COLON, RIGHT, RESECTION:  - Invasive moderately differentiated adenocarcinoma.  - Separate tubulovillous adenoma.  - All surgical margins negative for dysplasia and malignancy.  - One of thirty-six lymph nodes involved by metastatic adenocarcinoma (1/36).  - See Oncology Table.   ADDENDUM:  Mismatch Repair Protein ([facility])  SUMMARY INTERPRETATION: NORMAL    04/23/2021 Initial Diagnosis   Cancer of right colon (HCC)      HISTORY OF PRESENTING ILLNESS:  Vickie Holloway 76 y.o. female is a here because of colon cancer. The patient was referred by Dr. Dema Severin. The patient presents to the clinic today accompanied by her husband.   She reports she experienced symptoms of what she thought was appendicitis. She presented to the ED on 02/08/21, and CT AP at that time showed: distal ascending colon with associated pericolonic fat stranding and prominent but nonenlarged mesenteric lymph nodes.  She proceeded to outpatient colonoscopy on 02/24/21 with Dr. Silverio Decamp showing a partially-obstructing tumor in ascending colon. Pathology from the procedure revealed adenocarcinoma. Staging chest CT on 03/05/21 was negative for metastatic disease.  She proceeded to hemi-colectomy on 04/05/21 under Dr. Dema Severin. Pathology showed invasive moderately differentiated adenocarcinoma, with separate tubulovillous adenoma. Surgical margins negative, but one lymph node showed metastatic carcinoma (1/36). MMR normal.   Today the patient notes they felt/feeling prior/after... -she denies weight change (baseline between 118-122 lbs, per pt) -she reports the abdominal pain only occurred once; she denies any further pain.  She has a PMHx of.... -depression and anxiety -HTN -sp hysterectomy  Socially... -married with three children, two daughters and a son. Their son is a quadriplegic.  -she has a history of smoking ("on and off" for ~20 years) and  alcohol use (up to 4 days a week)   REVIEW OF SYSTEMS:    Constitutional: Denies fevers, chills or abnormal night sweats Eyes: Denies blurriness of vision, double vision or watery eyes Ears, nose, mouth, throat, and face: Denies mucositis or sore throat Respiratory: Denies cough, dyspnea or wheezes Cardiovascular: Denies palpitation, chest discomfort or lower extremity swelling Gastrointestinal:  Denies nausea, heartburn or change in bowel habits Skin: Denies abnormal skin rashes Lymphatics: Denies new lymphadenopathy or easy bruising Neurological:Denies numbness, tingling or new weaknesses Behavioral/Psych: Mood is stable, no new changes  All other systems were reviewed with the patient and are negative.   MEDICAL HISTORY:  Past Medical History:  Diagnosis Date   Allergy    Anxiety    Arthritis    Colon cancer ([place])    Depression    Hypertension    Insomnia     SURGICAL HISTORY: Past Surgical History:  Procedure Laterality Date   ABDOMINAL HYSTERECTOMY  03/22/1983   CATARACT EXTRACTION W/ INTRAOCULAR LENS IMPLANT Left    COLONOSCOPY     COSMETIC SURGERY  03/21/1997   face lift   DILATION AND CURETTAGE OF UTERUS     LAPAROSCOPIC RIGHT HEMI COLECTOMY Right 04/05/2021   Procedure: LAPAROSCOPIC RIGHT HEMI COLECTOMY;  Surgeon: Ileana Roup, MD;  Location: [facility];  Service: General;  Laterality: Right;   ORIF WRIST FRACTURE Left 01/01/2013   Procedure: OPEN REDUCTION INTERNAL FIXATION (ORIF)OF LEFT DISTAL RADIUS FRACTURE;  Surgeon: Cammie Sickle., MD;  Location: [facility];  Service: Orthopedics;  Laterality: Left;   REFRACTIVE SURGERY     TONSILLECTOMY      SOCIAL HISTORY: Social History   Socioeconomic History   Marital status: Married    Spouse name: Not on file   Number of children: 3   Years of education: Not on file   Highest education level: Not on file  Occupational History   Not on file  Tobacco Use   Smoking status: Former     Packs/day: 0.25    Years: 20.00    Pack years: 5.00    Types: Cigarettes    Quit date: 01/01/1979    Years since quitting: 42.3   Smokeless tobacco: Former  Scientific laboratory technician Use: Former  Substance and Sexual Activity   Alcohol use: Yes    Comment: she used to drink alcoho (wine)l 3-4 days a week, stopped in 2020   Drug use: No   Sexual activity: Not on file  Other Topics Concern   Not on file  Social History Narrative   Not on file   Social Determinants of Health   Financial Resource Strain: Not on file  Food Insecurity: Not on file  Transportation Needs: Not on file  Physical Activity: Not on file  Stress: Not on file  Social Connections: Not on file  Intimate Partner Violence: Not on file    FAMILY HISTORY: Family History  Problem Relation Age of Onset   Stroke Mother    Heart disease Mother    Pulmonary fibrosis Father    Cancer Maternal Grandmother        lung cancer   Colon cancer Neg Hx    Stomach cancer Neg Hx    Esophageal cancer Neg Hx    Pancreatic cancer Neg Hx     ALLERGIES:  has No Known Allergies.  MEDICATIONS:  Current Outpatient Medications  Medication Sig Dispense Refill   capecitabine (XELODA) 500 MG tablet Take 3 tablets (1,500 mg total) by mouth 2 (two) times daily after a meal. Take for 14 days then off for 7 days 84 tablet 0   Ascorbic Acid (VITAMIN C PO) Take 2 tablets by mouth daily.     ferrous sulfate 325 (65 FE) MG tablet Take 1 tablet (325 mg total) by mouth 2 (two) times daily with a meal for 14 days. 28 tablet 0   lamoTRIgine (LAMICTAL) 200 MG tablet Take 200 mg by mouth daily.     Magnesium 400 MG TABS Take 400 mg by mouth daily.     Multiple Vitamins-Minerals (HAIR SKIN AND NAILS FORMULA PO) Take 3 tablets by mouth daily.     Multiple Vitamins-Minerals (ZINC PO) Take 1 tablet by mouth daily.     olmesartan (BENICAR) 20 MG tablet Take 20 mg by mouth daily.     VITAMIN D PO Take 2,000 Units by mouth daily.     No current  facility-administered medications for this visit.    PHYSICAL EXAMINATION: ECOG PERFORMANCE STATUS: 0 - Asymptomatic  Vitals:   04/23/21 1134  BP: (!) 148/70  Pulse: 84  Resp: 19  Temp: 98.6 F (37 C)  SpO2: 100%   Filed Weights   04/23/21 1134  Weight: 118 lb 12.8 oz (53.9 kg)    GENERAL:alert, no distress and comfortable SKIN: skin color, texture, turgor are normal, no rashes or significant lesions EYES: normal, Conjunctiva are pink and non-injected, sclera clear  NECK: supple, thyroid normal size, non-tender, without nodularity LYMPH:  no palpable lymphadenopathy in the  cervical, axillary  LUNGS: clear to auscultation and percussion with normal breathing effort HEART: regular rate & rhythm and no murmurs and no lower extremity edema ABDOMEN:abdomen soft, non-tender and normal bowel sounds, incisions have healed well Musculoskeletal:no cyanosis of digits and no clubbing  NEURO: alert & oriented x 3 with fluent speech, no focal motor/sensory deficits  LABORATORY DATA:  I have reviewed the data as listed CBC Latest Ref Rng & Units 04/23/2021 04/07/2021 04/06/2021  WBC 4.0 - 10.5 K/uL 5.1 6.0 7.9  Hemoglobin 12.0 - 15.0 g/dL 12.4 9.8(L) 9.2(L)  Hematocrit 36.0 - 46.0 % 40.6 31.8(L) 29.1(L)  Platelets 150 - 400 K/uL 409(H) 253 220    CMP Latest Ref Rng & Units 04/23/2021 04/08/2021 04/07/2021  Glucose 70 - 99 mg/dL 90 110(H) 95  BUN 8 - 23 mg/dL _0 Creatinine 0.44 - 1.00 mg/dL 0.77 0.52 0.62  Sodium 135 - 145 mmol/L 140 139 137  Potassium 3.5 - 5.1 mmol/L 4.4 3.9 3.1(L)  Chloride 98 - 111 mmol/L 105 106 105  CO2 22 - 32 mmol/L _1 Calcium 8.9 - 10.3 mg/dL 9.7 8.6(L) 8.3(L)  Total Protein 6.5 - 8.1 g/dL 7.5 - -  Total Bilirubin 0.3 - 1.2 mg/dL 0.4 - -  Alkaline Phos 38 - 126 U/L 99 - -  AST 15 - 41 U/L 28 - -  ALT 0 - 44 U/L 29 - -     RADIOGRAPHIC STUDIES: I have personally reviewed the radiological images as listed and agreed with the findings in the  report. No results found.   Orders Placed This Encounter  Procedures   Ferritin    Standing Status:   Standing    Number of Occurrences:   20    Standing Expiration Date:   04/23/2022   CEA (IN HOUSE-[facility])    Standing Status:   Standing    Number of Occurrences:   5    Standing Expiration Date:   04/23/2022   CBC with Differential/Platelet    Standing Status:   Standing    Number of Occurrences:   50    Standing Expiration Date:   04/23/2022   Comprehensive metabolic panel    Standing Status:   Standing    Number of Occurrences:   50    Standing Expiration Date:   04/23/2022    All questions were answered. The patient knows to call the clinic with any problems, questions or concerns. The total time spent in the appointment was 60 minutes.     Truitt Merle, MD 04/23/2021   I, Wilburn Mylar, am acting as scribe for Truitt Merle, MD.   I have reviewed the above documentation for accuracy and completeness, and I agree with the above.

## 2021-04-23 NOTE — Telephone Encounter (Signed)
Oral Oncology Patient Advocate Encounter  After completing a benefits investigation, prior authorization for Xeloda is not required at this time through Medicare B.  Patient's copay is $28.22.    [place] Patient [place] Phone [phone] Fax [phone] 04/23/2021 1:37 PM

## 2021-04-23 NOTE — Telephone Encounter (Signed)
Oral Oncology Pharmacist Encounter  Received new prescription for Xeloda (capecitabine) for the adjuvant treatment of stage IIIB colon cancer, planned duration 6 months.  CBC w/ Diff and CMP from 04/23/21 assessed, no relevant lab abnormalities noted. Prescription dose and frequency assessed for appropriateness. Appropriate for therapy initiation. Planned start date per note is 05/03/21.  Current medication list in Epic reviewed, no relevant/significant DDIs with Xeloda identified.  Evaluated chart and no patient barriers to medication adherence noted.   Patient agreement for treatment documented in MD note on 04/23/21.  Prescription has been e-scribed to the [facility] for benefits analysis and approval.  Oral Oncology Clinic will continue to follow for insurance authorization, copayment issues, initial counseling and start date.  Leron Croak, PharmD, BCPS Hematology/Oncology Clinical Pharmacist [facility] [place] [phone] 04/23/2021 3:29 PM

## 2021-04-23 NOTE — Progress Notes (Signed)
I met with Mrs and Mr Neiswender after her consultation with Dr Burr Medico.  I explained my role as a nurse navigator and provided my contact information. I explained the services provided at [facility] and provided written information.I briefly reviewed common side effects associated with the recommended chemotherapy regimen,  Xeloda..  I told her that Wells Guiles our oral chemotherapy pharmacist will be contacting her for chemo education.  All questions were answered.  She verbalized understanding.

## 2021-04-24 NOTE — Progress Notes (Signed)
Please let pt know her lab results. Anemia resolved, iron level adequate, CEA normal, no concerns. Thanks   Truitt Merle

## 2021-04-26 ENCOUNTER — Other Ambulatory Visit (HOSPITAL_COMMUNITY): Payer: Self-pay

## 2021-04-26 ENCOUNTER — Encounter: Payer: Self-pay | Admitting: General Practice

## 2021-04-26 NOTE — Progress Notes (Signed)
[place] Spiritual Care Note  Left voicemail per referral from GI Navigator [place]/RN for additional layer of support. Encouraged return call.   [place], [place], [facility] Pager [phone] Voicemail [phone]

## 2021-04-26 NOTE — Telephone Encounter (Signed)
Oral Chemotherapy Pharmacist Encounter   Attempted to reach patient to provide update and offer for initial counseling on oral medication: Xeloda (capecitabine).   No answer. Left voicemail for patient to call back to discuss details of medication acquisition and initial counseling session.  Leron Croak, PharmD, BCPS Hematology/Oncology Clinical Pharmacist [facility] and [place] [phone] 04/26/2021 11:40 AM

## 2021-04-26 NOTE — Telephone Encounter (Signed)
Oral Chemotherapy Pharmacist Encounter  I spoke with patient for overview of: Xeloda (capecitabine) for the adjuvant treatment of stage IIIB colon cancer, planned duration 6 months.  Counseled patient on administration, dosing, side effects, monitoring, drug-food interactions, safe handling, storage, and disposal.  Patient will take Xeloda 500mg  tablets, 3 tablets (1500mg ) by mouth in AM and 3 tabs (1500mg ) by mouth in PM, within 30 minutes of finishing meals, for 14 days on, 7 days off, repeated every 21 days.  Xeloda start date: 05/03/21  Adverse effects include but are not limited to: fatigue, decreased blood counts, GI upset, diarrhea, mouth sores, and hand-foot syndrome.  Patient will obtain anti diarrheal and alert the office of 4 or more loose stools above baseline.  Reviewed with patient importance of keeping a medication schedule and plan for any missed doses. No barriers to medication adherence identified.  Medication reconciliation performed and medication/allergy list updated.  Patient will pick this up from the [facility] on 04/26/21.   Patient informed the pharmacy will reach out 5-7 days prior to needing next fill of Xeloda to coordinate continued medication acquisition to prevent break in therapy.  All questions answered.  Vickie Holloway voiced understanding and appreciation.   Medication education handout and medication calendar placed in mail for patient. Patient knows to call the office with questions or concerns. Oral Chemotherapy Clinic phone number provided to patient.   Leron Croak, PharmD, BCPS Hematology/Oncology Clinical Pharmacist [facility] [place] [phone] 04/26/2021 1:21 PM

## 2021-04-27 ENCOUNTER — Other Ambulatory Visit (HOSPITAL_COMMUNITY): Payer: Self-pay

## 2021-04-27 ENCOUNTER — Other Ambulatory Visit: Payer: Self-pay

## 2021-04-27 MED ORDER — ONDANSETRON HCL 8 MG PO TABS: 8.0000 mg | ORAL_TABLET | Freq: Three times a day (TID) | ORAL | 3 refills | Status: DC | PRN

## 2021-04-27 NOTE — Progress Notes (Signed)
Order given by Dr. Burr Medico via Staff message for Zofran 8mg  PO Q8hrs

## 2021-05-07 ENCOUNTER — Other Ambulatory Visit (HOSPITAL_COMMUNITY): Payer: Self-pay

## 2021-05-07 ENCOUNTER — Other Ambulatory Visit: Payer: Self-pay | Admitting: Hematology

## 2021-05-07 NOTE — Telephone Encounter (Signed)
Refilled 2 weeks ago. Gardiner Rhyme, RN

## 2021-05-10 ENCOUNTER — Inpatient Hospital Stay (HOSPITAL_BASED_OUTPATIENT_CLINIC_OR_DEPARTMENT_OTHER): Payer: Medicare Other | Admitting: Hematology

## 2021-05-10 ENCOUNTER — Other Ambulatory Visit: Payer: Self-pay | Admitting: Hematology

## 2021-05-10 ENCOUNTER — Other Ambulatory Visit: Payer: Self-pay

## 2021-05-10 ENCOUNTER — Encounter: Payer: Self-pay | Admitting: Hematology

## 2021-05-10 ENCOUNTER — Other Ambulatory Visit (HOSPITAL_COMMUNITY): Payer: Self-pay

## 2021-05-10 DIAGNOSIS — C182 Malignant neoplasm of ascending colon: Secondary | ICD-10-CM | POA: Diagnosis not present

## 2021-05-10 MED ORDER — CAPECITABINE 500 MG PO TABS
1000.0000 mg/m2 | ORAL_TABLET | Freq: Two times a day (BID) | ORAL | 1 refills | Status: DC
  Filled 2021-05-10: qty 84, 21d supply, fill #0
  Filled 2021-05-31: qty 84, 21d supply, fill #1

## 2021-05-10 NOTE — Progress Notes (Signed)
[place]   Telephone:(336) [phone] [phone]) [phone]   Clinic Follow up Note   Patient Care Team: [name], MD as PCP - General (Family Medicine) Truitt Merle, MD as Consulting Physician (Oncology) Royston Bake, RN as Oncology Nurse Navigator (Oncology)  Date of Service:  05/10/2021  I connected with Vickie Holloway on 05/10/2021 at  1:40 PM EST by telephone visit and verified that I am speaking with the correct person using two identifiers.  I discussed the limitations, risks, security and privacy concerns of performing an evaluation and management service by telephone and the availability of in person appointments. I also discussed with the patient that there may be a patient responsible charge related to this service. The patient expressed understanding and agreed to proceed.   Other persons participating in the visit and their role in the encounter:  none  Patient's location:  home Provider's location:  my office  CHIEF COMPLAINT: f/u of colon cancer  CURRENT THERAPY:  Adjuvant Xeloda, 1570m q12h on day 1-14 every 21 days, starting 05/03/21  ASSESSMENT & PLAN:  Vickie Holloway a 76y.o. female with   1. Cancer of Holloway Colon, Stage IIIB p(T3, N1aM0), MSI-S -presented to ED with RLQ pain on 02/08/21. Colonoscopy on 02/24/21 showed a partially-obstructing tumor in ascending colon. Pathology showed adenocarcinoma. Staging chest CT on 03/05/21 was negative. -Holloway hemi-colectomy on 04/05/21 with Dr. WDema Severinshowed 4.7 cm invasive moderately differentiated adenocarcinoma. Margins were negative, one lymph node was positive (1/36). -given the positive lymph node, she started adjuvant Xeloda on 05/03/21, plan to continue for 6 months. -she tolerated the first week of Xeloda very well with no noticeable side effects.   2. Anxiety and depression -continue meds  -I offered counseling with SW      PLAN:  -continue Xeloda 15072mq12h on day 1-14 every 21 days, she is  on second week now  -lab and f/u on 2/27 before cycle 2     No problem-specific Assessment & Plan notes found for this encounter.   SUMMARY OF ONCOLOGIC HISTORY: Oncology History Overview Note   Cancer Staging  [facility]: Colon and Rectum, AJCC 8th Edition - Pathologic stage from 04/05/2021: Stage IIIB (pT3, pN1a, cM0) - Signed by FeTruitt MerleMD on 04/23/2021    [facility]   CLINICAL DATA:  Holloway lower quadrant abdominal pain. Nausea and fever.   EXAM: CT ABDOMEN AND PELVIS WITH CONTRAST  IMPRESSION: 1. Distal ascending colon with associated pericolonic fat stranding and prominent but nonenlarged mesenteric lymph nodes. Findings suggestive of malignancy. Differential diagnosis of infection. Recommend colonoscopy after course of infection treatment to exclude malignancy. 2. Tiny hiatal hernia. 3. Nonobstructive punctate left nephrolithiasis.   02/24/2021 Procedure   Colonoscopy, Dr. NaSilverio DecampImpression: - Rule out malignancy, polypoid lesion in the cecum. Biopsied. - Likely malignant partially obstructing tumor in the ascending colon. Biopsied. Tattooed. - Diverticulosis in the sigmoid colon. - Non-bleeding external and internal hemorrhoids.   02/24/2021 Initial Biopsy   Diagnosis 1. Cecum Biopsy, Mass - FRAGMENTS OF TUBULOVILLOUS ADENOMA. - NO HIGH GRADE DYSPLASIA OR INVASIVE CARCINOMA. 2. Ascending Colon Biopsy, Mass - ADENOCARCINOMA.   03/05/2021 Imaging   EXAM: CT CHEST WITH CONTRAST  IMPRESSION: No evidence of metastatic disease in the chest.   Heterogeneous, enlarged thyroid with multiple nodules measuring up to 2.2 cm. Recommend thyroid USKorearef: J Am Coll Radiol. 2015 Feb;12(2): 143-50).  Coronary artery disease.   Aortic Atherosclerosis (ICD10-I70.0).   04/05/2021 Cancer Staging   Staging form: Colon and Rectum, AJCC 8th Edition - Pathologic stage from 04/05/2021: Stage IIIB (pT3, pN1a,  cM0) - Signed by Truitt Merle, MD on 04/23/2021 Total positive nodes: 1 Histologic grading system: 4 grade system Histologic grade (G): G2 Residual tumor (R): R0 - None    04/05/2021 Definitive Surgery   FINAL MICROSCOPIC DIAGNOSIS:   A. COLON, Holloway, RESECTION:  - Invasive moderately differentiated adenocarcinoma.  - Separate tubulovillous adenoma.  - All surgical margins negative for dysplasia and malignancy.  - One of thirty-six lymph nodes involved by metastatic adenocarcinoma (1/36).  - See Oncology Table.   ADDENDUM:  Mismatch Repair Protein (IHC)  SUMMARY INTERPRETATION: NORMAL    04/23/2021 Initial Diagnosis   Cancer of Holloway colon ([place])      INTERVAL HISTORY:  Vickie Holloway was contacted for a follow up of colon cancer. She was last seen by me on 04/23/21. She reports she is doing well on the Xeloda, with no noticeable side effects. She denies skin issues, bowel disturbances, or fatigue.    All other systems were reviewed with the patient and are negative.  MEDICAL HISTORY:  Past Medical History:  Diagnosis Date   Allergy    Anxiety    Arthritis    Colon cancer ([place])    Depression    Hypertension    Insomnia     SURGICAL HISTORY: Past Surgical History:  Procedure Laterality Date   ABDOMINAL HYSTERECTOMY  03/22/1983   CATARACT EXTRACTION W/ INTRAOCULAR LENS IMPLANT Left    COLONOSCOPY     COSMETIC SURGERY  03/21/1997   face lift   DILATION AND CURETTAGE OF UTERUS     LAPAROSCOPIC Holloway HEMI COLECTOMY Holloway 04/05/2021   Procedure: LAPAROSCOPIC Holloway HEMI COLECTOMY;  Surgeon: Ileana Roup, MD;  Location: [facility];  Service: General;  Laterality: Holloway;   ORIF WRIST FRACTURE Left 01/01/2013   Procedure: OPEN REDUCTION INTERNAL FIXATION (ORIF)OF LEFT DISTAL RADIUS FRACTURE;  Surgeon: Cammie Sickle., MD;  Location: [facility];  Service: Orthopedics;  Laterality: Left;   REFRACTIVE SURGERY     TONSILLECTOMY      I have reviewed the social  history and family history with the patient and they are unchanged from previous note.  ALLERGIES:  has No Known Allergies.  MEDICATIONS:  Current Outpatient Medications  Medication Sig Dispense Refill   Ascorbic Acid (VITAMIN C PO) Take 2 tablets by mouth daily.     capecitabine (XELODA) 500 MG tablet Take 3 tablets (1,500 mg total) by mouth 2 (two) times daily after a meal. Take for 14 days then off for 7 days 84 tablet 1   ferrous sulfate 325 (65 FE) MG tablet Take 1 tablet (325 mg total) by mouth 2 (two) times daily with a meal for 14 days. 28 tablet 0   lamoTRIgine (LAMICTAL) 200 MG tablet Take 200 mg by mouth daily.     Magnesium 400 MG TABS Take 400 mg by mouth daily.     Multiple Vitamins-Minerals (HAIR SKIN AND NAILS FORMULA PO) Take 3 tablets by mouth daily.     Multiple Vitamins-Minerals (ZINC PO) Take 1 tablet by mouth daily.     olmesartan (BENICAR) 20 MG tablet Take 20 mg by mouth daily.     ondansetron (ZOFRAN) 8 MG tablet Take 1 tablet (8 mg total) by mouth every 8 (eight) hours as needed for nausea or vomiting. 20 tablet  3   VITAMIN D PO Take 2,000 Units by mouth daily.     No current facility-administered medications for this visit.    PHYSICAL EXAMINATION: ECOG PERFORMANCE STATUS: 0 - Asymptomatic  There were no vitals filed for this visit. Wt Readings from Last 3 Encounters:  04/23/21 118 lb 12.8 oz (53.9 kg)  04/08/21 127 lb 13.9 oz (58 kg)  04/01/21 118 lb 12.8 oz (53.9 kg)     No vitals taken today, Exam not performed today  LABORATORY DATA:  I have reviewed the data as listed CBC Latest Ref Rng & Units 04/23/2021 04/07/2021 04/06/2021  WBC 4.0 - 10.5 K/uL 5.1 6.0 7.9  Hemoglobin 12.0 - 15.0 g/dL 12.4 9.8(L) 9.2(L)  Hematocrit 36.0 - 46.0 % 40.6 31.8(L) 29.1(L)  Platelets 150 - 400 K/uL 409(H) 253 220     CMP Latest Ref Rng & Units 04/23/2021 04/08/2021 04/07/2021  Glucose 70 - 99 mg/dL 90 110(H) 95  BUN 8 - 23 mg/dL _0 Creatinine 0.44 - 1.00 mg/dL  0.77 0.52 0.62  Sodium 135 - 145 mmol/L 140 139 137  Potassium 3.5 - 5.1 mmol/L 4.4 3.9 3.1(L)  Chloride 98 - 111 mmol/L 105 106 105  CO2 22 - 32 mmol/L _1 Calcium 8.9 - 10.3 mg/dL 9.7 8.6(L) 8.3(L)  Total Protein 6.5 - 8.1 g/dL 7.5 - -  Total Bilirubin 0.3 - 1.2 mg/dL 0.4 - -  Alkaline Phos 38 - 126 U/L 99 - -  AST 15 - 41 U/L 28 - -  ALT 0 - 44 U/L 29 - -      RADIOGRAPHIC STUDIES: I have personally reviewed the radiological images as listed and agreed with the findings in the report. No results found.    No orders of the defined types were placed in this encounter.  All questions were answered. The patient knows to call the clinic with any problems, questions or concerns. No barriers to learning was detected. The total time spent in the appointment was 8 minutes.     Truitt Merle, MD 05/10/2021   I, Wilburn Mylar, am acting as scribe for Truitt Merle, MD.   I have reviewed the above documentation for accuracy and completeness, and I agree with the above.

## 2021-05-12 ENCOUNTER — Other Ambulatory Visit: Payer: Self-pay

## 2021-05-12 NOTE — Progress Notes (Signed)
The proposed treatment discussed in conference is for discussion purpose only and is not a binding recommendation.  The patients have not been physically examined, or presented with their treatment options.  Therefore, final treatment plans cannot be decided.  

## 2021-05-14 ENCOUNTER — Other Ambulatory Visit (HOSPITAL_COMMUNITY): Payer: Self-pay

## 2021-05-17 ENCOUNTER — Other Ambulatory Visit (HOSPITAL_COMMUNITY): Payer: Self-pay

## 2021-05-17 ENCOUNTER — Inpatient Hospital Stay (HOSPITAL_BASED_OUTPATIENT_CLINIC_OR_DEPARTMENT_OTHER): Payer: Medicare Other | Admitting: Hematology

## 2021-05-17 ENCOUNTER — Other Ambulatory Visit: Payer: Self-pay

## 2021-05-17 ENCOUNTER — Inpatient Hospital Stay (HOSPITAL_BASED_OUTPATIENT_CLINIC_OR_DEPARTMENT_OTHER): Payer: Medicare Other

## 2021-05-17 ENCOUNTER — Encounter: Payer: Self-pay | Admitting: Hematology

## 2021-05-17 VITALS — BP 141/81 | HR 72 | Temp 98.5°F | Resp 18 | Ht 65.0 in | Wt 123.1 lb

## 2021-05-17 DIAGNOSIS — C182 Malignant neoplasm of ascending colon: Secondary | ICD-10-CM

## 2021-05-17 LAB — COMPREHENSIVE METABOLIC PANEL
ALT: 19 U/L (ref 0–44)
AST: 25 U/L (ref 15–41)
Albumin: 4.2 g/dL (ref 3.5–5.0)
Alkaline Phosphatase: 74 U/L (ref 38–126)
Anion gap: 4 — ABNORMAL LOW (ref 5–15)
BUN: 14 mg/dL (ref 8–23)
CO2: 30 mmol/L (ref 22–32)
Calcium: 9.5 mg/dL (ref 8.9–10.3)
Chloride: 107 mmol/L (ref 98–111)
Creatinine, Ser: 0.69 mg/dL (ref 0.44–1.00)
GFR, Estimated: >60 mL/min (ref >60)
Glucose, Bld: 96 mg/dL (ref 70–99)
Potassium: 4.4 mmol/L (ref 3.5–5.1)
Sodium: 141 mmol/L (ref 135–145)
Total Bilirubin: 0.7 mg/dL (ref 0.3–1.2)
Total Protein: 6.6 g/dL (ref 6.5–8.1)

## 2021-05-17 LAB — CBC WITH DIFFERENTIAL/PLATELET
Abs Immature Granulocytes: 0 10*3/uL (ref 0.00–0.07)
Basophils Absolute: 0 10*3/uL (ref 0.0–0.1)
Basophils Relative: 1 %
Eosinophils Absolute: 0 10*3/uL (ref 0.0–0.5)
Eosinophils Relative: 1 %
HCT: 33.6 % — ABNORMAL LOW (ref 36.0–46.0)
Hemoglobin: 11 g/dL — ABNORMAL LOW (ref 12.0–15.0)
Immature Granulocytes: 0 %
Lymphocytes Relative: 38 %
Lymphs Abs: 1.2 10*3/uL (ref 0.7–4.0)
MCH: 28.9 pg (ref 26.0–34.0)
MCHC: 32.7 g/dL (ref 30.0–36.0)
MCV: 88.2 fL (ref 80.0–100.0)
Monocytes Absolute: 0.4 10*3/uL (ref 0.1–1.0)
Monocytes Relative: 12 %
Neutro Abs: 1.5 10*3/uL — ABNORMAL LOW (ref 1.7–7.7)
Neutrophils Relative %: 48 %
Platelets: 269 10*3/uL (ref 150–400)
RBC: 3.81 MIL/uL — ABNORMAL LOW (ref 3.87–5.11)
RDW: 15.7 % — ABNORMAL HIGH (ref 11.5–15.5)
WBC: 3.1 10*3/uL — ABNORMAL LOW (ref 4.0–10.5)
nRBC: 0 % (ref 0.0–0.2)

## 2021-05-17 NOTE — Progress Notes (Signed)
[place]   Telephone:(336) [phone] Fax:(336) [phone]   Clinic Follow up Note   Patient Care Team: [name], MD as PCP - General (Family Medicine) Truitt Merle, MD as Consulting Physician (Oncology) Royston Bake, RN as Oncology Nurse Navigator (Oncology)  Date of Service:  05/17/2021  CHIEF COMPLAINT: f/u of colon cancer  CURRENT THERAPY:  Adjuvant Xeloda, 1537m q12h on day 1-14 every 21 days, starting 05/03/21  ASSESSMENT & PLAN:  Vickie Holloway a 76y.o. female with   1. Cancer of Right Colon, Stage IIIB p(T3, N1aM0), MSI-S -presented to ED with RLQ pain on 02/08/21. Colonoscopy on 02/24/21 showed a partially-obstructing tumor in ascending colon. Pathology showed adenocarcinoma. Staging chest CT on 03/05/21 was negative. -right hemi-colectomy on 04/05/21 with Dr. WDema Severinshowed 4.7 cm invasive moderately differentiated adenocarcinoma. Margins were negative, one lymph node was positive (1/36). -given the positive lymph node, she started adjuvant Xeloda on 05/03/21, plan to continue for 6 months. -she tolerated the first cycle of Xeloda very well with no noticeable side effects. Labs reviewed, slightly lowered blood counts from Xeloda, will monitor. -she was started cycle 2 next Monday -Follow-up in 3 weeks   2. Anxiety and depression -continue meds  -I offered counseling with SW      PLAN:  -continue Xeloda 15017mq12h on day 1-14 every 21 days, will start cycle 2 on 3/6 -lab and f/u in 3-4 weeks   No problem-specific Assessment & Plan notes found for this encounter.   SUMMARY OF ONCOLOGIC HISTORY: Oncology History Overview Note   Cancer Staging  [facility] right [facility] ([facility]: Colon and Rectum, AJCC 8th Edition - Pathologic stage from 04/05/2021: Stage IIIB (pT3, pN1a, cM0) - Signed by FeTruitt MerleMD on 04/23/2021    [facility]   CLINICAL DATA:  Right lower quadrant abdominal pain. Nausea and  fever.   EXAM: CT ABDOMEN AND PELVIS WITH CONTRAST  IMPRESSION: 1. Distal ascending colon with associated pericolonic fat stranding and prominent but nonenlarged mesenteric lymph nodes. Findings suggestive of malignancy. Differential diagnosis of infection. Recommend colonoscopy after course of infection treatment to exclude malignancy. 2. Tiny hiatal hernia. 3. Nonobstructive punctate left nephrolithiasis.   02/24/2021 Procedure   Colonoscopy, Dr. NaSilverio DecampImpression: - Rule out malignancy, polypoid lesion in the cecum. Biopsied. - Likely malignant partially obstructing tumor in the ascending colon. Biopsied. Tattooed. - Diverticulosis in the sigmoid colon. - Non-bleeding external and internal hemorrhoids.   02/24/2021 Initial Biopsy   Diagnosis 1. Cecum Biopsy, Mass - FRAGMENTS OF TUBULOVILLOUS ADENOMA. - NO HIGH GRADE DYSPLASIA OR INVASIVE CARCINOMA. 2. Ascending Colon Biopsy, Mass - ADENOCARCINOMA.   03/05/2021 Imaging   EXAM: CT CHEST WITH CONTRAST  IMPRESSION: No evidence of metastatic disease in the chest.   Heterogeneous, enlarged thyroid with multiple nodules measuring up to 2.2 cm. Recommend thyroid USKorearef: J Am Coll Radiol. 2015 Feb;12(2): 143-50).   Coronary artery disease.   Aortic Atherosclerosis (ICD10-I70.0).   04/05/2021 Cancer Staging   Staging form: Colon and Rectum, AJCC 8th Edition - Pathologic stage from 04/05/2021: Stage IIIB (pT3, pN1a, cM0) - Signed by FeTruitt MerleMD on 04/23/2021 Total positive nodes: 1 Histologic grading system: 4 grade system Histologic grade (G): G2 Residual tumor (R): R0 - None    04/05/2021 Definitive Surgery   FINAL MICROSCOPIC DIAGNOSIS:   A. COLON, RIGHT, RESECTION:  - Invasive moderately differentiated adenocarcinoma.  - Separate tubulovillous adenoma.  - All  surgical margins negative for dysplasia and malignancy.  - One of thirty-six lymph nodes involved by metastatic adenocarcinoma (1/36).  - See Oncology  Table.   ADDENDUM:  Mismatch Repair Protein (IHC)  SUMMARY INTERPRETATION: NORMAL    04/23/2021 Initial Diagnosis   Cancer of right colon ([place])      INTERVAL HISTORY:  Vickie Holloway is here for a follow up of colon cancer. She was last seen by me on 04/24/21 with phone visit in the interim. She presents to the clinic accompanied by her husband. She reports she is doing very well overall. She endorses walking daily.  She notes she is having some itching to the external skin around her vaginal area.   All other systems were reviewed with the patient and are negative.  MEDICAL HISTORY:  Past Medical History:  Diagnosis Date   Allergy    Anxiety    Arthritis    Colon cancer ([place])    Depression    Hypertension    Insomnia     SURGICAL HISTORY: Past Surgical History:  Procedure Laterality Date   ABDOMINAL HYSTERECTOMY  03/22/1983   CATARACT EXTRACTION W/ INTRAOCULAR LENS IMPLANT Left    COLONOSCOPY     COSMETIC SURGERY  03/21/1997   face lift   DILATION AND CURETTAGE OF UTERUS     LAPAROSCOPIC RIGHT HEMI COLECTOMY Right 04/05/2021   Procedure: LAPAROSCOPIC RIGHT HEMI COLECTOMY;  Surgeon: Ileana Roup, MD;  Location: [facility];  Service: General;  Laterality: Right;   ORIF WRIST FRACTURE Left 01/01/2013   Procedure: OPEN REDUCTION INTERNAL FIXATION (ORIF)OF LEFT DISTAL RADIUS FRACTURE;  Surgeon: Cammie Sickle., MD;  Location: [place];  Service: Orthopedics;  Laterality: Left;   REFRACTIVE SURGERY     TONSILLECTOMY      I have reviewed the social history and family history with the patient and they are unchanged from previous note.  ALLERGIES:  has No Known Allergies.  MEDICATIONS:  Current Outpatient Medications  Medication Sig Dispense Refill   Ascorbic Acid (VITAMIN C PO) Take 2 tablets by mouth daily.     capecitabine (XELODA) 500 MG tablet Take 3 tablets (1,500 mg total) by mouth 2 (two) times daily after a meal. Take for 14 days then off  for 7 days 84 tablet 1   ferrous sulfate 325 (65 FE) MG tablet Take 1 tablet (325 mg total) by mouth 2 (two) times daily with a meal for 14 days. 28 tablet 0   lamoTRIgine (LAMICTAL) 200 MG tablet Take 200 mg by mouth daily.     Magnesium 400 MG TABS Take 400 mg by mouth daily.     Multiple Vitamins-Minerals (ZINC PO) Take 1 tablet by mouth daily.     olmesartan (BENICAR) 20 MG tablet Take 20 mg by mouth daily.     ondansetron (ZOFRAN) 8 MG tablet Take 1 tablet (8 mg total) by mouth every 8 (eight) hours as needed for nausea or vomiting. 20 tablet 3   VITAMIN D PO Take 2,000 Units by mouth daily.     No current facility-administered medications for this visit.    PHYSICAL EXAMINATION: ECOG PERFORMANCE STATUS: 0 - Asymptomatic  Vitals:   05/17/21 1342  BP: (!) 141/81  Pulse: 72  Resp: 18  Temp: 98.5 F (36.9 C)  SpO2: 100%   Wt Readings from Last 3 Encounters:  05/17/21 123 lb 1.6 oz (55.8 kg)  04/23/21 118 lb 12.8 oz (53.9 kg)  04/08/21 127 lb 13.9 oz (58  kg)     GENERAL:alert, no distress and comfortable SKIN: skin color normal, no rashes or significant lesions EYES: normal, Conjunctiva are pink and non-injected, sclera clear  NEURO: alert & oriented x 3 with fluent speech  LABORATORY DATA:  I have reviewed the data as listed CBC Latest Ref Rng & Units 05/17/2021 04/23/2021 04/07/2021  WBC 4.0 - 10.5 K/uL 3.1(L) 5.1 6.0  Hemoglobin 12.0 - 15.0 g/dL 11.0(L) 12.4 9.8(L)  Hematocrit 36.0 - 46.0 % 33.6(L) 40.6 31.8(L)  Platelets 150 - 400 K/uL 269 409(H) 253     CMP Latest Ref Rng & Units 05/17/2021 04/23/2021 04/08/2021  Glucose 70 - 99 mg/dL 96 90 110(H)  BUN 8 - 23 mg/dL '14 13 12  ' Creatinine 0.44 - 1.00 mg/dL 0.69 0.77 0.52  Sodium 135 - 145 mmol/L 141 140 139  Potassium 3.5 - 5.1 mmol/L 4.4 4.4 3.9  Chloride 98 - 111 mmol/L 107 105 106  CO2 22 - 32 mmol/L '30 29 26  ' Calcium 8.9 - 10.3 mg/dL 9.5 9.7 8.6(L)  Total Protein 6.5 - 8.1 g/dL 6.6 7.5 -  Total Bilirubin 0.3 -  1.2 mg/dL 0.7 0.4 -  Alkaline Phos 38 - 126 U/L 74 99 -  AST 15 - 41 U/L 25 28 -  ALT 0 - 44 U/L 19 29 -      RADIOGRAPHIC STUDIES: I have personally reviewed the radiological images as listed and agreed with the findings in the report. No results found.    No orders of the defined types were placed in this encounter.  All questions were answered. The patient knows to call the clinic with any problems, questions or concerns. No barriers to learning was detected.      Truitt Merle, MD 05/17/2021   I, Wilburn Mylar, am acting as scribe for Truitt Merle, MD.   I have reviewed the above documentation for accuracy and completeness, and I agree with the above.

## 2021-05-18 ENCOUNTER — Other Ambulatory Visit (HOSPITAL_COMMUNITY): Payer: Self-pay

## 2021-05-19 ENCOUNTER — Other Ambulatory Visit: Payer: Self-pay

## 2021-05-31 ENCOUNTER — Other Ambulatory Visit (HOSPITAL_COMMUNITY): Payer: Self-pay

## 2021-06-05 ENCOUNTER — Other Ambulatory Visit (HOSPITAL_COMMUNITY): Payer: Self-pay

## 2021-06-07 ENCOUNTER — Telehealth: Payer: Self-pay

## 2021-06-07 ENCOUNTER — Other Ambulatory Visit: Payer: Self-pay | Admitting: Hematology

## 2021-06-07 MED ORDER — UREA 10 % EX CREA: TOPICAL_CREAM | Freq: Two times a day (BID) | CUTANEOUS | 0 refills | Status: DC

## 2021-06-07 NOTE — Telephone Encounter (Signed)
Pt called stating that she's having some burning sensations in her bilateral feet and hands.  Pt stated her feet and hands are red and dry.  Pt stated she's unable to really walk d/t the burning in her feet.  Pt stated she has some red spots on her feet.  Pt stated she's completed her cycle of Capecitabine and has a f/u appt with Dr. Burr Medico on Friday 06/11/2021.  Pt states she's using [place] on her hand & feet but wants to know what else can she do because she's never had problems walking.  Informed pt that this RN will notify Dr. Burr Medico of her complaints.   ?

## 2021-06-08 ENCOUNTER — Other Ambulatory Visit: Payer: Self-pay

## 2021-06-08 MED ORDER — TRIAMCINOLONE ACETONIDE 0.5 % EX OINT: 1.0000 "application " | TOPICAL_OINTMENT | Freq: Two times a day (BID) | CUTANEOUS | 0 refills | Status: DC

## 2021-06-09 ENCOUNTER — Other Ambulatory Visit (HOSPITAL_COMMUNITY): Payer: Self-pay

## 2021-06-11 ENCOUNTER — Inpatient Hospital Stay (HOSPITAL_BASED_OUTPATIENT_CLINIC_OR_DEPARTMENT_OTHER): Payer: Medicare Other | Admitting: Hematology

## 2021-06-11 ENCOUNTER — Inpatient Hospital Stay: Payer: Medicare Other | Attending: Hematology

## 2021-06-11 ENCOUNTER — Other Ambulatory Visit: Payer: Self-pay

## 2021-06-11 ENCOUNTER — Other Ambulatory Visit (HOSPITAL_COMMUNITY): Payer: Self-pay

## 2021-06-11 VITALS — BP 141/69 | HR 93 | Temp 97.6°F | Resp 19 | Ht 65.0 in | Wt 124.5 lb

## 2021-06-11 DIAGNOSIS — Z90710 Acquired absence of both cervix and uterus: Secondary | ICD-10-CM | POA: Insufficient documentation

## 2021-06-11 DIAGNOSIS — F418 Other specified anxiety disorders: Secondary | ICD-10-CM | POA: Diagnosis not present

## 2021-06-11 DIAGNOSIS — C182 Malignant neoplasm of ascending colon: Secondary | ICD-10-CM | POA: Insufficient documentation

## 2021-06-11 DIAGNOSIS — I10 Essential (primary) hypertension: Secondary | ICD-10-CM | POA: Diagnosis not present

## 2021-06-11 LAB — COMPREHENSIVE METABOLIC PANEL
ALT: 19 U/L (ref 0–44)
AST: 24 U/L (ref 15–41)
Albumin: 4.2 g/dL (ref 3.5–5.0)
Alkaline Phosphatase: 92 U/L (ref 38–126)
Anion gap: 6 (ref 5–15)
BUN: 16 mg/dL (ref 8–23)
CO2: 30 mmol/L (ref 22–32)
Calcium: 9.7 mg/dL (ref 8.9–10.3)
Chloride: 105 mmol/L (ref 98–111)
Creatinine, Ser: 0.77 mg/dL (ref 0.44–1.00)
GFR, Estimated: >60 mL/min (ref >60)
Glucose, Bld: 96 mg/dL (ref 70–99)
Potassium: 3.9 mmol/L (ref 3.5–5.1)
Sodium: 141 mmol/L (ref 135–145)
Total Bilirubin: 0.7 mg/dL (ref 0.3–1.2)
Total Protein: 6.5 g/dL (ref 6.5–8.1)

## 2021-06-11 LAB — FERRITIN: Ferritin: 32 ng/mL (ref 11–307)

## 2021-06-11 LAB — CBC WITH DIFFERENTIAL/PLATELET
Abs Immature Granulocytes: 0.02 10*3/uL (ref 0.00–0.07)
Basophils Absolute: 0 10*3/uL (ref 0.0–0.1)
Basophils Relative: 1 %
Eosinophils Absolute: 0.1 10*3/uL (ref 0.0–0.5)
Eosinophils Relative: 1 %
HCT: 35.2 % — ABNORMAL LOW (ref 36.0–46.0)
Hemoglobin: 11.2 g/dL — ABNORMAL LOW (ref 12.0–15.0)
Immature Granulocytes: 0 %
Lymphocytes Relative: 27 %
Lymphs Abs: 1.4 10*3/uL (ref 0.7–4.0)
MCH: 29.4 pg (ref 26.0–34.0)
MCHC: 31.8 g/dL (ref 30.0–36.0)
MCV: 92.4 fL (ref 80.0–100.0)
Monocytes Absolute: 0.6 10*3/uL (ref 0.1–1.0)
Monocytes Relative: 12 %
Neutro Abs: 3 10*3/uL (ref 1.7–7.7)
Neutrophils Relative %: 59 %
Platelets: 216 10*3/uL (ref 150–400)
RBC: 3.81 MIL/uL — ABNORMAL LOW (ref 3.87–5.11)
RDW: 19 % — ABNORMAL HIGH (ref 11.5–15.5)
WBC: 5.1 10*3/uL (ref 4.0–10.5)
nRBC: 0 % (ref 0.0–0.2)

## 2021-06-11 MED ORDER — CAPECITABINE 500 MG PO TABS
ORAL_TABLET | ORAL | 0 refills | Status: DC
  Filled 2021-06-11: qty 56, fill #0
  Filled 2021-06-28: qty 56, 21d supply, fill #0

## 2021-06-11 MED ORDER — UREA 10 % EX CREA
TOPICAL_CREAM | Freq: Two times a day (BID) | CUTANEOUS | 0 refills | Status: DC
  Filled 2021-06-11: qty 71, fill #0

## 2021-06-11 NOTE — Progress Notes (Signed)
?[place]   ?[phone] [phone]   ?Clinic Follow up Note  ? ?Patient Care Team: ?Vickie Rasmussen, MD as PCP - General (Family Medicine) ?Vickie Merle, MD as Consulting Physician (Oncology) ?Vickie Holloway, Vickie Goody, RN as Sales executive (Oncology) ? ?Date of Service:  06/11/2021 ? ?CHIEF COMPLAINT: f/u of colon cancer ? ?CURRENT THERAPY:  ?Adjuvant Xeloda, starting 05/03/21 ? -current dose: 1038m AM, 15050mPM on day 1-14 every 21 days, starting C3 ? ?ASSESSMENT & PLAN:  ?[name] a [age].o. female with  ? ?1. Cancer of Right Colon, Stage IIIB p(T3, N1aM0), MSI-S ?-presented to ED with RLQ pain on 02/08/21. Colonoscopy on 02/24/21 showed a partially-obstructing tumor in ascending colon. Pathology showed adenocarcinoma. Staging chest CT on 03/05/21 was negative. ?-right hemi-colectomy on 04/05/21 with Dr. WhDema Severinhowed 4.7 cm invasive moderately differentiated adenocarcinoma. Margins were negative, one lymph node was positive (1/36). ?-given the positive lymph node, she started adjuvant Xeloda on 05/03/21, plan to continue for 6 months. ?-with cycle 2, she experienced significantly more skin toxicity, especially to her feet. I called in urea and kenalog creams 06/08/21. I will reduce her dose for next cycle; she will reduce AM dose to 2 tablets (100032m ?-will give her one more week break, and start next cycle on 4/3  ?-Follow-up in 3 weeks ?  ?2. Anxiety and depression ?-continue meds per PCP ?  ?  ?PLAN:  ?-restart Xeloda on 4/3 (cycle 2) with reduced AM dose to 1000m85montinue evening dose 1500mg67mr 2 weeks  ?-[facility] and f/u in 3 weeks ? ? ?No problem-specific Assessment & Plan notes found for this encounter. ? ? ?SUMMARY OF ONCOLOGIC HISTORY: ?Oncology History Overview Note  ? Cancer Staging  ?Cancer of right colon (HCC) Copeaging form: Colon and Rectum, AJCC 8th Edition ?- Pathologic stage from 04/05/2021: Stage IIIB (pT3, pN1a, cM0)  - Signed by Raianna Slight,Vickie Merleon 04/23/2021 ? ?  ?Cancer of right colon (HCC)[facility]1/21/[facility] Imaging  ? CLINICAL DATA:  Right lower quadrant abdominal pain. Nausea and fever. ?  ?EXAM: ?CT ABDOMEN AND PELVIS WITH CONTRAST ? ?IMPRESSION: ?1. Distal ascending colon with associated pericolonic fat stranding ?and prominent but nonenlarged mesenteric lymph nodes. Findings ?suggestive of malignancy. Differential diagnosis of infection. ?Recommend colonoscopy after course of infection treatment to exclude malignancy. ?2. Tiny hiatal hernia. ?3. Nonobstructive punctate left nephrolithiasis. ?  ?02/24/2021 Procedure  ? Colonoscopy, Dr. NandiSilverio Decampmpression: ?- Rule out malignancy, polypoid lesion in the cecum. Biopsied. ?- Likely malignant partially obstructing tumor in the ascending colon. Biopsied. Tattooed. ?- Diverticulosis in the sigmoid colon. ?- Non-bleeding external and internal hemorrhoids. ?  ?02/24/2021 Initial Biopsy  ? Diagnosis ?1. Cecum Biopsy, Mass ?- FRAGMENTS OF TUBULOVILLOUS ADENOMA. ?- NO HIGH GRADE DYSPLASIA OR INVASIVE CARCINOMA. ?2. Ascending Colon Biopsy, Mass ?- ADENOCARCINOMA. ?  ?03/05/2021 Imaging  ? EXAM: ?CT CHEST WITH CONTRAST ? ?IMPRESSION: ?No evidence of metastatic disease in the chest. ?  ?Heterogeneous, enlarged thyroid with multiple nodules measuring up to 2.2 cm. Recommend thyroid US (r[place]: J Am Coll Radiol. 2015 ?Feb;12(2): 143-50). ?  ?Coronary artery disease. ?  ?Aortic Atherosclerosis (ICD10-I70.0). ?  ?04/05/2021 Cancer Staging  ? Staging form: Colon and Rectum, AJCC 8th Edition ?- Pathologic stage from 04/05/2021: Stage IIIB (pT3, pN1a, cM0) - Signed by Alex Mcmanigal,Vickie Merleon 04/23/2021 ?Total positive nodes: 1 ?Histologic grading system: 4 grade system ?Histologic grade (G): G2 ?Residual tumor (  R): R0 - None ? ?  ?04/05/2021 Definitive Surgery  ? FINAL MICROSCOPIC DIAGNOSIS:  ? ?A. COLON, RIGHT, RESECTION:  ?- Invasive moderately differentiated adenocarcinoma.  ?- Separate tubulovillous adenoma.  ?-  All surgical margins negative for dysplasia and malignancy.  ?- One of thirty-six lymph nodes involved by metastatic adenocarcinoma (1/36).  ?- See Oncology Table.  ? ?ADDENDUM:  ?Mismatch Repair Protein (IHC)  ?SUMMARY INTERPRETATION: NORMAL  ?  ?04/23/2021 Initial Diagnosis  ? Cancer of right colon ([place]) ?  ? ? ? ?INTERVAL HISTORY:  ?Vickie Holloway is here for a follow up of colon cancer. She was last seen by me on 05/17/21. She presents to the clinic accompanied by her husband. ?She had contacted [place] on 06/08/21 with skin concerns. She was having small blisters with pain and burning to her feet. This has improved in her off week and looks better today. ?  ?All other systems were reviewed with the patient and are negative. ? ?MEDICAL HISTORY:  ?Past Medical History:  ?Diagnosis Date  ? Allergy   ? Anxiety   ? Arthritis   ? Colon cancer ([place])   ? Depression   ? Hypertension   ? Insomnia   ? ? ?SURGICAL HISTORY: ?Past Surgical History:  ?Procedure Laterality Date  ? ABDOMINAL HYSTERECTOMY  03/22/1983  ? CATARACT EXTRACTION W/ INTRAOCULAR LENS IMPLANT Left   ? COLONOSCOPY    ? COSMETIC SURGERY  03/21/1997  ? face lift  ? DILATION AND CURETTAGE OF UTERUS    ? LAPAROSCOPIC RIGHT HEMI COLECTOMY Right 04/05/2021  ? Procedure: LAPAROSCOPIC RIGHT HEMI COLECTOMY;  Surgeon: Ileana Roup, MD;  Location: [facility];  Service: General;  Laterality: Right;  ? ORIF WRIST FRACTURE Left 01/01/2013  ? Procedure: OPEN REDUCTION INTERNAL FIXATION (ORIF)OF LEFT DISTAL RADIUS FRACTURE;  Surgeon: Cammie Sickle., MD;  Location: [place];  Service: Orthopedics;  Laterality: Left;  ? REFRACTIVE SURGERY    ? TONSILLECTOMY    ? ? ?I have reviewed the social history and family history with the patient and they are unchanged from previous note. ? ?ALLERGIES:  has No Known Allergies. ? ?MEDICATIONS:  ?Current Outpatient Medications  ?Medication Sig Dispense Refill  ? Ascorbic Acid (VITAMIN C PO) Take 2 tablets by mouth daily.     ? capecitabine (XELODA) 500 MG tablet Take 3 tablets (1,500 mg total) by mouth 2 (two) times daily after a meal. Take for 14 days then off for 7 days 84 tablet 1  ? ferrous sulfate 325 (65 FE) MG tablet Take 1 tablet (325 mg total) by mouth 2 (two) times daily with a meal for 14 days. 28 tablet 0  ? lamoTRIgine (LAMICTAL) 200 MG tablet Take 200 mg by mouth daily.    ? Magnesium 400 MG TABS Take 400 mg by mouth daily.    ? Multiple Vitamins-Minerals (ZINC PO) Take 1 tablet by mouth daily.    ? olmesartan (BENICAR) 20 MG tablet Take 20 mg by mouth daily.    ? ondansetron (ZOFRAN) 8 MG tablet Take 1 tablet (8 mg total) by mouth every 8 (eight) hours as needed for nausea or vomiting. 20 tablet 3  ? triamcinolone ointment (KENALOG) 0.5 % Apply 1 application. topically 2 (two) times daily. 30 g 0  ? urea (CARMOL) 10 % cream Apply topically 2 (two) times daily. 71 g 0  ? VITAMIN D PO Take 2,000 Units by mouth daily.    ? ?No current facility-administered medications for this visit.  ? ? ?  PHYSICAL EXAMINATION: ?ECOG PERFORMANCE STATUS: 2 - Symptomatic, <50% confined to bed ? ?Vitals:  ? 06/11/21 1412  ?BP: (!) 141/69  ?Pulse: 93  ?Resp: 19  ?Temp: 97.6 ?F (36.4 ?C)  ?SpO2: 97%  ? ?Wt Readings from Last 3 Encounters:  ?06/11/21 124 lb 8 oz (56.5 kg)  ?05/17/21 123 lb 1.6 oz (55.8 kg)  ?04/23/21 118 lb 12.8 oz (53.9 kg)  ?  ? ?GENERAL:alert, no distress and comfortable ?SKIN: skin color normal, no rashes or significant lesions, except skin toxicity from Xeloda to feet>hands. ?EYES: normal, Conjunctiva are pink and non-injected, sclera clear  ?NEURO: alert & oriented x 3 with fluent speech ? ?LABORATORY DATA:  ?I have reviewed the data as listed ? ?  Latest Ref Rng & Units 06/11/2021  ?  1:37 PM 05/17/2021  ?  1:11 PM 04/23/2021  ? 12:24 PM  ?CBC  ?WBC 4.0 - 10.5 K/uL 5.1   3.1   5.1    ?Hemoglobin 12.0 - 15.0 g/dL 11.2   11.0   12.4    ?Hematocrit 36.0 - 46.0 % 35.2   33.6   40.6    ?Platelets 150 - 400 K/uL 216   269   409     ? ? ? ? ?  Latest Ref Rng & Units 06/11/2021  ?  1:37 PM 05/17/2021  ?  1:11 PM 04/23/2021  ? 12:24 PM  ?CMP  ?Glucose 70 - 99 mg/dL 96   96   90    ?BUN 8 - 23 mg/dL _0 ?Creatinine 0.44 - 1

## 2021-06-12 ENCOUNTER — Encounter: Payer: Self-pay | Admitting: Hematology

## 2021-06-14 ENCOUNTER — Other Ambulatory Visit (HOSPITAL_COMMUNITY): Payer: Self-pay

## 2021-06-24 ENCOUNTER — Other Ambulatory Visit (HOSPITAL_COMMUNITY): Payer: Self-pay

## 2021-06-28 ENCOUNTER — Other Ambulatory Visit (HOSPITAL_COMMUNITY): Payer: Self-pay

## 2021-06-30 ENCOUNTER — Other Ambulatory Visit: Payer: Medicare Other

## 2021-06-30 ENCOUNTER — Ambulatory Visit: Payer: Medicare Other | Admitting: Hematology

## 2021-07-01 ENCOUNTER — Other Ambulatory Visit (HOSPITAL_COMMUNITY): Payer: Self-pay

## 2021-07-02 ENCOUNTER — Inpatient Hospital Stay: Payer: Medicare Other | Attending: Hematology

## 2021-07-02 ENCOUNTER — Other Ambulatory Visit (HOSPITAL_COMMUNITY): Payer: Self-pay

## 2021-07-02 ENCOUNTER — Inpatient Hospital Stay (HOSPITAL_BASED_OUTPATIENT_CLINIC_OR_DEPARTMENT_OTHER): Payer: Medicare Other | Admitting: Hematology

## 2021-07-02 ENCOUNTER — Other Ambulatory Visit: Payer: Self-pay

## 2021-07-02 VITALS — BP 167/83 | HR 73 | Temp 98.5°F | Resp 18 | Ht 65.0 in

## 2021-07-02 DIAGNOSIS — C182 Malignant neoplasm of ascending colon: Secondary | ICD-10-CM

## 2021-07-02 DIAGNOSIS — F418 Other specified anxiety disorders: Secondary | ICD-10-CM | POA: Insufficient documentation

## 2021-07-02 DIAGNOSIS — I10 Essential (primary) hypertension: Secondary | ICD-10-CM | POA: Diagnosis not present

## 2021-07-02 DIAGNOSIS — Z90710 Acquired absence of both cervix and uterus: Secondary | ICD-10-CM | POA: Diagnosis not present

## 2021-07-02 LAB — CBC WITH DIFFERENTIAL/PLATELET
Abs Immature Granulocytes: 0 10*3/uL (ref 0.00–0.07)
Basophils Absolute: 0 10*3/uL (ref 0.0–0.1)
Basophils Relative: 1 %
Eosinophils Absolute: 0.1 10*3/uL (ref 0.0–0.5)
Eosinophils Relative: 2 %
HCT: 35.2 % — ABNORMAL LOW (ref 36.0–46.0)
Hemoglobin: 11.3 g/dL — ABNORMAL LOW (ref 12.0–15.0)
Immature Granulocytes: 0 %
Lymphocytes Relative: 31 %
Lymphs Abs: 1.2 10*3/uL (ref 0.7–4.0)
MCH: 30.9 pg (ref 26.0–34.0)
MCHC: 32.1 g/dL (ref 30.0–36.0)
MCV: 96.2 fL (ref 80.0–100.0)
Monocytes Absolute: 0.5 10*3/uL (ref 0.1–1.0)
Monocytes Relative: 13 %
Neutro Abs: 2 10*3/uL (ref 1.7–7.7)
Neutrophils Relative %: 53 %
Platelets: 213 10*3/uL (ref 150–400)
RBC: 3.66 MIL/uL — ABNORMAL LOW (ref 3.87–5.11)
RDW: 22.4 % — ABNORMAL HIGH (ref 11.5–15.5)
WBC: 3.8 10*3/uL — ABNORMAL LOW (ref 4.0–10.5)
nRBC: 0 % (ref 0.0–0.2)

## 2021-07-02 LAB — COMPREHENSIVE METABOLIC PANEL WITH GFR
ALT: 22 U/L (ref 0–44)
AST: 27 U/L (ref 15–41)
Albumin: 4.2 g/dL (ref 3.5–5.0)
Alkaline Phosphatase: 94 U/L (ref 38–126)
Anion gap: 4 — ABNORMAL LOW (ref 5–15)
BUN: 16 mg/dL (ref 8–23)
CO2: 31 mmol/L (ref 22–32)
Calcium: 9.8 mg/dL (ref 8.9–10.3)
Chloride: 105 mmol/L (ref 98–111)
Creatinine, Ser: 0.78 mg/dL (ref 0.44–1.00)
GFR, Estimated: >60 mL/min
Glucose, Bld: 96 mg/dL (ref 70–99)
Potassium: 4 mmol/L (ref 3.5–5.1)
Sodium: 140 mmol/L (ref 135–145)
Total Bilirubin: 0.8 mg/dL (ref 0.3–1.2)
Total Protein: 6.7 g/dL (ref 6.5–8.1)

## 2021-07-02 MED ORDER — CAPECITABINE 500 MG PO TABS
ORAL_TABLET | ORAL | 2 refills | Status: DC
  Filled 2021-07-02: qty 70, fill #0
  Filled 2021-07-15: qty 70, 21d supply, fill #0
  Filled 2021-08-05: qty 70, 21d supply, fill #1
  Filled 2021-08-31: qty 70, 21d supply, fill #2

## 2021-07-02 MED ORDER — ZOLPIDEM TARTRATE 5 MG PO TABS
2.5000 mg | ORAL_TABLET | Freq: Every evening | ORAL | 0 refills | Status: DC | PRN
  Filled 2021-07-02: qty 15, 15d supply, fill #0

## 2021-07-02 MED ORDER — UREA 10 % EX CREA
TOPICAL_CREAM | Freq: Two times a day (BID) | CUTANEOUS | 2 refills | Status: DC
  Filled 2021-07-02: qty 71, fill #0

## 2021-07-02 NOTE — Progress Notes (Signed)
?[place]   ?Telephone:(336) [phone] [phone]   ?Clinic Follow up Note  ? ?Patient Care Team: ?[name], MD as PCP - General (Family Medicine) ?Truitt Merle, MD as Consulting Physician (Oncology) ?Earl Gala, Deliah Goody, RN as Sales executive (Oncology) ? ?Date of Service:  07/02/2021 ? ?CHIEF COMPLAINT: f/u of colon cancer ? ?CURRENT THERAPY:  ?Adjuvant Xeloda, starting 05/03/21 ?            -current dose: 1514m AM, 10052mPM on day 1-14 every 21 days, starting C3 ? ?ASSESSMENT & PLAN:  ?CaChancie Holloway a [age].o. female with  ? ?1. Cancer of Right Colon, Stage IIIB p(T3, N1aM0), MSI-S ?-presented to ED with RLQ pain on 02/08/21. Colonoscopy on 02/24/21 showed a partially-obstructing tumor in ascending colon. Pathology showed adenocarcinoma. Staging chest CT on 03/05/21 was negative. ?-right hemi-colectomy on 04/05/21 with Dr. WhDema Severinhowed 4.7 cm invasive moderately differentiated adenocarcinoma. Margins were negative, one lymph node was positive (1/36). ?-given the positive lymph node, she started adjuvant Xeloda 150045mid for 14 days on 05/03/21, plan to continue for 6 months. ?-we reduced her dose with cycle 3 and called in urea cream. She reports she tolerated much better. Labs reviewed, overall stable. We will continue at this dose, and I will refill her urea cream. ?-Follow-up in 3 weeks ?  ?2. Anxiety and depression ?-continue meds per PCP ?-I called in ambien for her to use once in a while. ?  ?  ?PLAN:  ?-continue Xeloda at same dose ?-I [facility] to [facility] [place] and f/u in 3 weeks ? ? ?No problem-specific Assessment & Plan notes found for this encounter. ? ? ?SUMMARY OF ONCOLOGIC HISTORY: ?Oncology History Overview Note  ? Cancer Staging  ?Cancer of right colon (HCCCorfuStaging form: Colon and Rectum, AJCC 8th Edition ?- Pathologic stage from 04/05/2021: Stage IIIB (pT3, pN1a, cM0) - Signed by FenTruitt MerleD on 04/23/2021 ? ?  [facility]  ? CLINICAL DATA:  Right lower quadrant abdominal pain. Nausea and fever. ?  ?EXAM: ?CT ABDOMEN AND PELVIS WITH CONTRAST ? ?IMPRESSION: ?1. Distal ascending colon with associated pericolonic fat stranding ?and prominent but nonenlarged mesenteric lymph nodes. Findings ?suggestive of malignancy. Differential diagnosis of infection. ?Recommend colonoscopy after course of infection treatment to exclude malignancy. ?2. Tiny hiatal hernia. ?3. Nonobstructive punctate left nephrolithiasis. ?  ?02/24/2021 Procedure  ? Colonoscopy, Dr. NanSilverio Decamp?Impression: ?- Rule out malignancy, polypoid lesion in the cecum. Biopsied. ?- Likely malignant partially obstructing tumor in the ascending colon. Biopsied. Tattooed. ?- Diverticulosis in the sigmoid colon. ?- Non-bleeding external and internal hemorrhoids. ?  ?02/24/2021 Initial Biopsy  ? Diagnosis ?1. Cecum Biopsy, Mass ?- FRAGMENTS OF TUBULOVILLOUS ADENOMA. ?- NO HIGH GRADE DYSPLASIA OR INVASIVE CARCINOMA. ?2. Ascending Colon Biopsy, Mass ?- ADENOCARCINOMA. ?  ?03/05/2021 Imaging  ? EXAM: ?CT CHEST WITH CONTRAST ? ?IMPRESSION: ?No evidence of metastatic disease in the chest. ?  ?Heterogeneous, enlarged thyroid with multiple nodules measuring up to 2.2 cm. Recommend thyroid US [place]: J Am Coll Radiol. 2015 ?Feb;12(2): 143-50). ?  ?Coronary artery disease. ?  ?Aortic Atherosclerosis (ICD10-I70.0). ?  ?04/05/2021 Cancer Staging  ? Staging form: Colon and Rectum, AJCC 8th Edition ?- Pathologic stage from 04/05/2021: Stage IIIB (pT3, pN1a, cM0) - Signed by FenTruitt MerleD on 04/23/2021 ?Total positive nodes: 1 ?Histologic grading system: 4 grade system ?Histologic grade (G): G2 ?Residual tumor (R): R0 -  None ? ?  ?04/05/2021 Definitive Surgery  ? FINAL MICROSCOPIC DIAGNOSIS:  ? ?A. COLON, RIGHT, RESECTION:  ?- Invasive moderately differentiated adenocarcinoma.  ?- Separate tubulovillous adenoma.  ?- All surgical margins negative for dysplasia and malignancy.  ?- One of  thirty-six lymph nodes involved by metastatic adenocarcinoma (1/36).  ?- See Oncology Table.  ? ?ADDENDUM:  ?Mismatch Repair Protein (IHC)  ?SUMMARY INTERPRETATION: NORMAL  ?  ?04/23/2021 Initial Diagnosis  ? Cancer of right colon ([place]) ?  ? ? ? ?INTERVAL HISTORY:  ?Vickie Holloway is here for a follow up of colon cancer. She was last seen by me on 06/11/21. She presents to the clinic accompanied by her husband. ?She reports the urea cream worked very well for her. She tells me she tolerated last cycle much better with that and the dose reduction. She reports she has been able to walk, is eating well, and sleeping well. She notes some loose stool but denies diarrhea. ?  ?All other systems were reviewed with the patient and are negative. ? ?MEDICAL HISTORY:  ?Past Medical History:  ?Diagnosis Date  ? Allergy   ? Anxiety   ? Arthritis   ? Colon cancer ([place])   ? Depression   ? Hypertension   ? Insomnia   ? ? ?SURGICAL HISTORY: ?Past Surgical History:  ?Procedure Laterality Date  ? ABDOMINAL HYSTERECTOMY  03/22/1983  ? CATARACT EXTRACTION W/ INTRAOCULAR LENS IMPLANT Left   ? COLONOSCOPY    ? COSMETIC SURGERY  03/21/1997  ? face lift  ? DILATION AND CURETTAGE OF UTERUS    ? LAPAROSCOPIC RIGHT HEMI COLECTOMY Right 04/05/2021  ? Procedure: LAPAROSCOPIC RIGHT HEMI COLECTOMY;  Surgeon: Ileana Roup, MD;  Location: [facility];  Service: General;  Laterality: Right;  ? ORIF WRIST FRACTURE Left 01/01/2013  ? Procedure: OPEN REDUCTION INTERNAL FIXATION (ORIF)OF LEFT DISTAL RADIUS FRACTURE;  Surgeon: Cammie Sickle., MD;  Location: [place];  Service: Orthopedics;  Laterality: Left;  ? REFRACTIVE SURGERY    ? TONSILLECTOMY    ? ? ?I have reviewed the social history and family history with the patient and they are unchanged from previous note. ? ?ALLERGIES:  has No Known Allergies. ? ?MEDICATIONS:  ?Current Outpatient Medications  ?Medication Sig Dispense Refill  ? Ascorbic Acid (VITAMIN C PO) Take 2 tablets by  mouth daily.    ? capecitabine (XELODA) 500 MG tablet Take 2 tabs in morning and 3 tabs in evening after meals, every 10-12 hours. Take for 14 days then off for 7 days, pt will have 14 tabs left from cycle 2, this is for cycle 3. 56 tablet 0  ? ferrous sulfate 325 (65 FE) MG tablet Take 1 tablet (325 mg total) by mouth 2 (two) times daily with a meal for 14 days. 28 tablet 0  ? lamoTRIgine (LAMICTAL) 200 MG tablet Take 200 mg by mouth daily.    ? Magnesium 400 MG TABS Take 400 mg by mouth daily.    ? Multiple Vitamins-Minerals (ZINC PO) Take 1 tablet by mouth daily.    ? olmesartan (BENICAR) 20 MG tablet Take 20 mg by mouth daily.    ? ondansetron (ZOFRAN) 8 MG tablet Take 1 tablet (8 mg total) by mouth every 8 (eight) hours as needed for nausea or vomiting. 20 tablet 3  ? triamcinolone ointment (KENALOG) 0.5 % Apply 1 application. topically 2 (two) times daily. 30 g 0  ? urea (CARMOL) 10 % cream Apply topically 2 (two) times  daily. 71 g 0  ? VITAMIN D PO Take 2,000 Units by mouth daily.    ? ?No current facility-administered medications for this visit.  ? ? ?PHYSICAL EXAMINATION: ?ECOG PERFORMANCE STATUS: 0 - Asymptomatic ? ?There were no vitals filed for this visit. ?Wt Readings from Last 3 Encounters:  ?06/11/21 124 lb 8 oz (56.5 kg)  ?05/17/21 123 lb 1.6 oz (55.8 kg)  ?04/23/21 118 lb 12.8 oz (53.9 kg)  ?  ? ?GENERAL:alert, no distress and comfortable ?SKIN: skin color normal, no rashes or significant lesions ?EYES: normal, Conjunctiva are pink and non-injected, sclera clear  ?NEURO: alert & oriented x 3 with fluent speech ? ?LABORATORY DATA:  ?I have reviewed the data as listed ? ?  Latest Ref Rng & Units 06/11/2021  ?  1:37 PM 05/17/2021  ?  1:11 PM 04/23/2021  ? 12:24 PM  ?CBC  ?WBC 4.0 - 10.5 K/uL 5.1   3.1   5.1    ?Hemoglobin 12.0 - 15.0 g/dL 11.2   11.0   12.4    ?Hematocrit 36.0 - 46.0 % 35.2   33.6   40.6    ?Platelets 150 - 400 K/uL 216   269   409    ? ? ? ? ?  Latest Ref Rng & Units 06/11/2021  ?  1:37 PM  05/17/2021  ?  1:11 PM 04/23/2021  ? 12:24 PM  ?CMP  ?Glucose 70 - 99 mg/dL 96   96   90    ?BUN 8 - 23 mg/dL '16   14   13    ' ?Creatinine 0.44 - 1.00 mg/dL 0.77   0.69   0.77    ?Sodium 135 - 145 mmol/L 141   1

## 2021-07-03 ENCOUNTER — Encounter: Payer: Self-pay | Admitting: Hematology

## 2021-07-06 ENCOUNTER — Other Ambulatory Visit: Payer: Self-pay

## 2021-07-06 ENCOUNTER — Telehealth: Payer: Self-pay

## 2021-07-06 DIAGNOSIS — E860 Dehydration: Secondary | ICD-10-CM

## 2021-07-06 DIAGNOSIS — D649 Anemia, unspecified: Secondary | ICD-10-CM

## 2021-07-06 NOTE — Telephone Encounter (Signed)
Pt called stating she has a fever of 100.1 x2 days.  Pt is currently taking Xeloda.  Denied pain, cough, diarrhea, nor vomiting.  Pt stated she just took temperature for the 1st time today and it was 100.1 wants to know what to do.  Asked pt has she taken anything for the fever.  Pt denied.  Asked pt has she taken a home COVID test.  Pt denied.  Pt stated she is nauseous and very fatigue.  Pt stated she is trying to stay hydrated by drinking plenty of water and Ginger Ale throughout the day.  Instructed pt to take a home COVID Test to r/o covid and to take Tylenol for her temperature.  Pt denied diarrhea or any stomach virus.  Pt will follow-up with Dr. Ernestina Penna office regarding Covid Test results on 07/07/2021.  This RN will see if pt can be seen in [facility] on 07/07/2021.  Pt will hold Xeloda for today and tomorrow until further assessed by [facility]. ?

## 2021-07-07 ENCOUNTER — Telehealth: Payer: Self-pay | Admitting: Hematology

## 2021-07-07 ENCOUNTER — Other Ambulatory Visit: Payer: Self-pay

## 2021-07-07 ENCOUNTER — Telehealth: Payer: Self-pay

## 2021-07-07 DIAGNOSIS — R509 Fever, unspecified: Secondary | ICD-10-CM

## 2021-07-07 NOTE — Telephone Encounter (Signed)
Scheduled follow-up appointments per 4/14 los. Patient is aware. ?

## 2021-07-07 NOTE — Telephone Encounter (Signed)
Spoke with pt via telephone regarding home COVID Test results.  Pt stated her home COVID test was negative and she's no longer having fevers.  Pt stated that her nose is a little stuffy and thinks it is just a head cold.  Pt stated she's drinking the Liquid IV which has made her feel so much better.  Pt stated her fatigue has resolved since drinking the Liquid IV.  Pt stated she's going to restart her Xeloda today.  Pt stated she doesn't think she needs to come into [facility] today for further assessment.  Instructed pt to contact Dr. Ernestina Penna office on Thursday should your symptoms start to decline.  Pt verbalized understanding. ?

## 2021-07-13 ENCOUNTER — Other Ambulatory Visit (HOSPITAL_COMMUNITY): Payer: Self-pay

## 2021-07-15 ENCOUNTER — Other Ambulatory Visit (HOSPITAL_COMMUNITY): Payer: Self-pay

## 2021-07-20 ENCOUNTER — Other Ambulatory Visit (HOSPITAL_COMMUNITY): Payer: Self-pay

## 2021-07-22 ENCOUNTER — Other Ambulatory Visit: Payer: Self-pay | Admitting: Physician Assistant

## 2021-07-22 DIAGNOSIS — C182 Malignant neoplasm of ascending colon: Secondary | ICD-10-CM

## 2021-07-23 ENCOUNTER — Inpatient Hospital Stay (HOSPITAL_BASED_OUTPATIENT_CLINIC_OR_DEPARTMENT_OTHER): Payer: Medicare Other | Admitting: Physician Assistant

## 2021-07-23 ENCOUNTER — Inpatient Hospital Stay: Payer: Medicare Other | Attending: Hematology

## 2021-07-23 ENCOUNTER — Other Ambulatory Visit: Payer: Medicare Other

## 2021-07-23 ENCOUNTER — Ambulatory Visit: Payer: Medicare Other | Admitting: Physician Assistant

## 2021-07-23 ENCOUNTER — Other Ambulatory Visit (HOSPITAL_COMMUNITY): Payer: Self-pay

## 2021-07-23 ENCOUNTER — Other Ambulatory Visit: Payer: Self-pay

## 2021-07-23 VITALS — BP 136/67 | HR 79 | Temp 97.9°F | Ht 65.0 in

## 2021-07-23 DIAGNOSIS — C182 Malignant neoplasm of ascending colon: Secondary | ICD-10-CM | POA: Diagnosis present

## 2021-07-23 DIAGNOSIS — F418 Other specified anxiety disorders: Secondary | ICD-10-CM | POA: Insufficient documentation

## 2021-07-23 DIAGNOSIS — I10 Essential (primary) hypertension: Secondary | ICD-10-CM | POA: Insufficient documentation

## 2021-07-23 DIAGNOSIS — Z90710 Acquired absence of both cervix and uterus: Secondary | ICD-10-CM | POA: Insufficient documentation

## 2021-07-23 LAB — CBC WITH DIFFERENTIAL (CANCER CENTER ONLY)
Abs Immature Granulocytes: 0 10*3/uL (ref 0.00–0.07)
Basophils Absolute: 0 10*3/uL (ref 0.0–0.1)
Basophils Relative: 1 %
Eosinophils Absolute: 0.1 10*3/uL (ref 0.0–0.5)
Eosinophils Relative: 2 %
HCT: 35.3 % — ABNORMAL LOW (ref 36.0–46.0)
Hemoglobin: 11.8 g/dL — ABNORMAL LOW (ref 12.0–15.0)
Immature Granulocytes: 0 %
Lymphocytes Relative: 26 %
Lymphs Abs: 1.1 10*3/uL (ref 0.7–4.0)
MCH: 33.3 pg (ref 26.0–34.0)
MCHC: 33.4 g/dL (ref 30.0–36.0)
MCV: 99.7 fL (ref 80.0–100.0)
Monocytes Absolute: 0.4 10*3/uL (ref 0.1–1.0)
Monocytes Relative: 10 %
Neutro Abs: 2.8 10*3/uL (ref 1.7–7.7)
Neutrophils Relative %: 61 %
Platelet Count: 234 10*3/uL (ref 150–400)
RBC: 3.54 MIL/uL — ABNORMAL LOW (ref 3.87–5.11)
RDW: 22.8 % — ABNORMAL HIGH (ref 11.5–15.5)
WBC Count: 4.4 10*3/uL (ref 4.0–10.5)
nRBC: 0 % (ref 0.0–0.2)

## 2021-07-23 LAB — CMP (CANCER CENTER ONLY)
ALT: 18 U/L (ref 0–44)
AST: 26 U/L (ref 15–41)
Albumin: 4.1 g/dL (ref 3.5–5.0)
Alkaline Phosphatase: 92 U/L (ref 38–126)
Anion gap: 4 — ABNORMAL LOW (ref 5–15)
BUN: 19 mg/dL (ref 8–23)
CO2: 32 mmol/L (ref 22–32)
Calcium: 9.4 mg/dL (ref 8.9–10.3)
Chloride: 108 mmol/L (ref 98–111)
Creatinine: 0.8 mg/dL (ref 0.44–1.00)
GFR, Estimated: >60 mL/min (ref >60)
Glucose, Bld: 86 mg/dL (ref 70–99)
Potassium: 4.2 mmol/L (ref 3.5–5.1)
Sodium: 144 mmol/L (ref 135–145)
Total Bilirubin: 0.9 mg/dL (ref 0.3–1.2)
Total Protein: 6.6 g/dL (ref 6.5–8.1)

## 2021-07-23 LAB — FERRITIN: Ferritin: 23 ng/mL (ref 11–307)

## 2021-07-23 LAB — CEA (IN HOUSE-CHCC): CEA (CHCC-In House): 2.41 ng/mL (ref 0.00–5.00)

## 2021-07-23 MED ORDER — ZOLPIDEM TARTRATE 5 MG PO TABS
2.5000 mg | ORAL_TABLET | Freq: Every evening | ORAL | 0 refills | Status: DC | PRN
  Filled 2021-07-23: qty 15, 15d supply, fill #0

## 2021-07-23 NOTE — Progress Notes (Signed)
?[place]   ?[phone] [phone]   ?Clinic Follow up Note  ? ?Patient Care Team: ?[name], MD as PCP - General (Family Medicine) ?Truitt Merle, MD as Consulting Physician (Oncology) ?Earl Gala, Deliah Goody, RN as Sales executive (Oncology) ? ?Date of Service:  07/23/2021 ? ?CHIEF COMPLAINT: f/u of colon cancer ? ?CURRENT THERAPY:  ?Adjuvant Xeloda, started 05/03/21 ?-current dose: 1520m AM, 10058mPM on day 1-14 every 21 days. Due to start C5 on 07/26/2021  ? ? ?SUMMARY OF ONCOLOGIC HISTORY: ?Oncology History Overview Note  ? Cancer Staging  ?[place]cer of right colon ([place]?Staging form: Colon and Rectum, AJCC 8th Edition ?- Pathologic stage from 04/05/2021: Stage IIIB (pT3, pN1a, cM0) - Signed by FeTruitt MerleMD on 04/23/2021 ? ?  [facility]  ? CLINICAL DATA:  Right lower quadrant abdominal pain. Nausea and fever. ?  ?EXAM: ?CT ABDOMEN AND PELVIS WITH CONTRAST ? ?IMPRESSION: ?1. Distal ascending colon with associated pericolonic fat stranding ?and prominent but nonenlarged mesenteric lymph nodes. Findings ?suggestive of malignancy. Differential diagnosis of infection. ?Recommend colonoscopy after course of infection treatment to exclude malignancy. ?2. Tiny hiatal hernia. ?3. Nonobstructive punctate left nephrolithiasis. ?  ?02/24/2021 Procedure  ? Colonoscopy, Dr. NaSilverio Decamp ?Impression: ?- Rule out malignancy, polypoid lesion in the cecum. Biopsied. ?- Likely malignant partially obstructing tumor in the ascending colon. Biopsied. Tattooed. ?- Diverticulosis in the sigmoid colon. ?- Non-bleeding external and internal hemorrhoids. ?  ?02/24/2021 Initial Biopsy  ? Diagnosis ?1. Cecum Biopsy, Mass ?- FRAGMENTS OF TUBULOVILLOUS ADENOMA. ?- NO HIGH GRADE DYSPLASIA OR INVASIVE CARCINOMA. ?2. Ascending Colon Biopsy, Mass ?- ADENOCARCINOMA. ?  ?03/05/2021 Imaging  ? EXAM: ?CT CHEST WITH CONTRAST ? ?IMPRESSION: ?No evidence of metastatic disease in  the chest. ?  ?Heterogeneous, enlarged thyroid with multiple nodules measuring up to 2.2 cm. Recommend thyroid USK[place]aref: J Am Coll Radiol. 2015 ?Feb;12(2): 143-50). ?  ?Coronary artery disease. ?  ?Aortic Atherosclerosis (ICD10-I70.0). ?  ?04/05/2021 Cancer Staging  ? Staging form: Colon and Rectum, AJCC 8th Edition ?- Pathologic stage from 04/05/2021: Stage IIIB (pT3, pN1a, cM0) - Signed by FeTruitt MerleMD on 04/23/2021 ?Total positive nodes: 1 ?Histologic grading system: 4 grade system ?Histologic grade (G): G2 ?Residual tumor (R): R0 - None ? ?  ?04/05/2021 Definitive Surgery  ? FINAL MICROSCOPIC DIAGNOSIS:  ? ?A. COLON, RIGHT, RESECTION:  ?- Invasive moderately differentiated adenocarcinoma.  ?- Separate tubulovillous adenoma.  ?- All surgical margins negative for dysplasia and malignancy.  ?- One of thirty-six lymph nodes involved by metastatic adenocarcinoma (1/36).  ?- See Oncology Table.  ? ?ADDENDUM:  ?Mismatch Repair Protein (IHC)  ?SUMMARY INTERPRETATION: NORMAL  ?  ?04/23/2021 Initial Diagnosis  ? [place] of right [place] ([place]? ?  ? ? ? ?INTERVAL HISTORY:  ?Vickie Holloway here for a follow up of colon cancer. She was last seen by Dr. FeBurr Medicon 07/02/2021.  She is unaccompanied for this visit. ? ?Ms. LoCallie Fieldingeports that she is tolerating the current dose of Xeloda.  She does have some peeling of the skin on her feet with occasional sensitivity.  Her energy levels are unchanged and she continues to complete all her daily activities on her own.  She has a good appetite and denies any recent weight loss.  She denies nausea, vomiting or abdominal pain.  She reports occasional episodes of loose stools but denies any watery diarrhea.  She denies easy bruising or signs of  active bleeding.  She denies fevers, chills, night sweats, shortness of breath, chest pain, cough, oral mucositis, rash, edema or peripheral neuropathy.  She has no other complaints. ?  ?All other systems were reviewed with the patient and are  negative. ? ?MEDICAL HISTORY:  ?Past Medical History:  ?Diagnosis Date  ? Allergy   ? Anxiety   ? Arthritis   ? Colon cancer ([place])   ? Depression   ? Hypertension   ? Insomnia   ? ? ?SURGICAL HISTORY: ?Past Surgical History:  ?Procedure Laterality Date  ? ABDOMINAL HYSTERECTOMY  03/22/1983  ? CATARACT EXTRACTION W/ INTRAOCULAR LENS IMPLANT Left   ? COLONOSCOPY    ? COSMETIC SURGERY  03/21/1997  ? face lift  ? DILATION AND CURETTAGE OF UTERUS    ? LAPAROSCOPIC RIGHT HEMI COLECTOMY Right 04/05/2021  ? Procedure: LAPAROSCOPIC RIGHT HEMI COLECTOMY;  Surgeon: Ileana Roup, MD;  Location: [facility];  Service: General;  Laterality: Right;  ? ORIF WRIST FRACTURE Left 01/01/2013  ? Procedure: OPEN REDUCTION INTERNAL FIXATION (ORIF)OF LEFT DISTAL RADIUS FRACTURE;  Surgeon: Cammie Sickle., MD;  Location: [place];  Service: Orthopedics;  Laterality: Left;  ? REFRACTIVE SURGERY    ? TONSILLECTOMY    ? ? ?I have reviewed the social history and family history with the patient and they are unchanged from previous note. ? ?ALLERGIES:  has No Known Allergies. ? ?MEDICATIONS:  ?Current Outpatient Medications  ?Medication Sig Dispense Refill  ? Ascorbic Acid (VITAMIN C PO) Take 2 tablets by mouth daily.    ? CALCIUM PO Take by mouth daily.    ? capecitabine (XELODA) 500 MG tablet Take 3 tabs in morning and 2 tabs in evening after meals, every 10-12 hours. Take for 14 days then off for 7 days. 70 tablet 2  ? lamoTRIgine (LAMICTAL) 200 MG tablet Take 200 mg by mouth daily.    ? Magnesium 400 MG TABS Take 400 mg by mouth daily.    ? Multiple Vitamins-Minerals (ZINC PO) Take 1 tablet by mouth daily.    ? olmesartan (BENICAR) 20 MG tablet Take 20 mg by mouth daily.    ? ondansetron (ZOFRAN) 8 MG tablet Take 1 tablet (8 mg total) by mouth every 8 (eight) hours as needed for nausea or vomiting. 20 tablet 3  ? Pyridoxine HCl (VITAMIN B6 PO) Take by mouth 2 (two) times daily.    ? triamcinolone ointment (KENALOG) 0.5  % Apply 1 application. topically 2 (two) times daily. 30 g 0  ? urea (CARMOL) 10 % cream Apply topically 2 (two) times daily. 71 g 2  ? VITAMIN D PO Take 2,000 Units by mouth daily.    ? ferrous sulfate 325 (65 FE) MG tablet Take 1 tablet (325 mg total) by mouth 2 (two) times daily with a meal for 14 days. 28 tablet 0  ? zolpidem (AMBIEN) 5 MG tablet Take 0.5-1 tablets (2.5-5 mg total) by mouth at bedtime as needed for sleep. 15 tablet 0  ? ?No current facility-administered medications for this visit.  ? ? ?PHYSICAL EXAMINATION: ?ECOG PERFORMANCE STATUS: 0 - Asymptomatic ? ?Vitals:  ? 07/23/21 0831  ?BP: 136/67  ?Pulse: 79  ?Temp: 97.9 ?F (36.6 ?C)  ?SpO2: 100%  ? ?Wt Readings from Last 3 Encounters:  ?06/11/21 124 lb 8 oz (56.5 kg)  ?05/17/21 123 lb 1.6 oz (55.8 kg)  ?04/23/21 118 lb 12.8 oz (53.9 kg)  ?  ? ?GENERAL:alert, no distress and comfortable ?SKIN:  skin color normal, no rashes or significant lesions ?EYES: normal, Conjunctiva are pink and non-injected, sclera clear  ?NEURO: alert & oriented x 3 with fluent speech ? ?LABORATORY DATA:  ?I have reviewed the data as listed ? ?  Latest Ref Rng & Units 07/23/2021  ?  8:11 AM 07/02/2021  ?  1:45 PM 06/11/2021  ?  1:37 PM  ?CBC  ?WBC 4.0 - 10.5 K/uL 4.4   3.8   5.1    ?Hemoglobin 12.0 - 15.0 g/dL 11.8   11.3   11.2    ?Hematocrit 36.0 - 46.0 % 35.3   35.2   35.2    ?Platelets 150 - 400 K/uL 234   213   216    ? ? ? ? ?  Latest Ref Rng & Units 07/23/2021  ?  8:11 AM 07/02/2021  ?  1:45 PM 06/11/2021  ?  1:37 PM  ?CMP  ?Glucose 70 - 99 mg/dL 86   96   96    ?BUN 8 - 23 mg/dL '19   16   16    ' ?Creatinine 0.44 - 1.00 mg/dL 0.80   0.78   0.77    ?Sodium 135 - 145 mmol/L 144   140   141    ?Potassium 3.5 - 5.1 mmol/L 4.2   4.0   3.9    ?Chloride 98 - 111 mmol/L 108   105   105    ?CO2 22 - 32 mmol/L 32   31   30    ?Calcium 8.9 - 10.3 mg/dL 9.4   9.8   9.7    ?Total Protein 6.5 - 8.1 g/dL 6.6   6.7   6.5    ?Total Bilirubin 0.3 - 1.2 mg/dL 0.9   0.8   0.7    ?Alkaline Phos 38 -  126 U/L 92   94   92    ?AST 15 - 41 U/L '26   27   24    ' ?ALT 0 - 44 U/L '18   22   19    ' ? ? ? ? ?RADIOGRAPHIC STUDIES: ?I have personally reviewed the radiological images as listed and agreed with the findings

## 2021-08-05 ENCOUNTER — Other Ambulatory Visit (HOSPITAL_COMMUNITY): Payer: Self-pay

## 2021-08-05 ENCOUNTER — Other Ambulatory Visit: Payer: Self-pay | Admitting: Hematology

## 2021-08-05 MED ORDER — ZOLPIDEM TARTRATE 5 MG PO TABS
2.5000 mg | ORAL_TABLET | Freq: Every evening | ORAL | 0 refills | Status: DC | PRN
Start: 2021-08-05 — End: 2021-08-31
  Filled 2021-08-05: qty 30, 30d supply, fill #0

## 2021-08-05 MED ORDER — ZOLPIDEM TARTRATE 5 MG PO TABS: 2.5000 mg | ORAL_TABLET | Freq: Every evening | ORAL | 0 refills | Status: DC | PRN

## 2021-08-09 ENCOUNTER — Other Ambulatory Visit (HOSPITAL_COMMUNITY): Payer: Self-pay

## 2021-08-11 ENCOUNTER — Other Ambulatory Visit (HOSPITAL_COMMUNITY): Payer: Self-pay

## 2021-08-12 ENCOUNTER — Other Ambulatory Visit: Payer: Self-pay

## 2021-08-12 ENCOUNTER — Encounter: Payer: Self-pay | Admitting: Hematology

## 2021-08-12 ENCOUNTER — Inpatient Hospital Stay (HOSPITAL_BASED_OUTPATIENT_CLINIC_OR_DEPARTMENT_OTHER): Payer: Medicare Other | Admitting: Hematology

## 2021-08-12 ENCOUNTER — Inpatient Hospital Stay: Payer: Medicare Other

## 2021-08-12 VITALS — BP 143/78 | HR 75 | Temp 98.9°F | Resp 18 | Ht 65.0 in | Wt 124.2 lb

## 2021-08-12 DIAGNOSIS — C182 Malignant neoplasm of ascending colon: Secondary | ICD-10-CM | POA: Diagnosis not present

## 2021-08-12 LAB — CBC WITH DIFFERENTIAL (CANCER CENTER ONLY)
Abs Immature Granulocytes: 0.02 10*3/uL (ref 0.00–0.07)
Basophils Absolute: 0 10*3/uL (ref 0.0–0.1)
Basophils Relative: 1 %
Eosinophils Absolute: 0.1 10*3/uL (ref 0.0–0.5)
Eosinophils Relative: 1 %
HCT: 33.7 % — ABNORMAL LOW (ref 36.0–46.0)
Hemoglobin: 11.5 g/dL — ABNORMAL LOW (ref 12.0–15.0)
Immature Granulocytes: 0 %
Lymphocytes Relative: 22 %
Lymphs Abs: 1.3 10*3/uL (ref 0.7–4.0)
MCH: 34.7 pg — ABNORMAL HIGH (ref 26.0–34.0)
MCHC: 34.1 g/dL (ref 30.0–36.0)
MCV: 101.8 fL — ABNORMAL HIGH (ref 80.0–100.0)
Monocytes Absolute: 0.6 10*3/uL (ref 0.1–1.0)
Monocytes Relative: 11 %
Neutro Abs: 3.8 10*3/uL (ref 1.7–7.7)
Neutrophils Relative %: 65 %
Platelet Count: 242 10*3/uL (ref 150–400)
RBC: 3.31 MIL/uL — ABNORMAL LOW (ref 3.87–5.11)
RDW: 21.9 % — ABNORMAL HIGH (ref 11.5–15.5)
WBC Count: 5.8 10*3/uL (ref 4.0–10.5)
nRBC: 0.3 % — ABNORMAL HIGH (ref 0.0–0.2)

## 2021-08-12 LAB — CMP (CANCER CENTER ONLY)
ALT: 17 U/L (ref 0–44)
AST: 22 U/L (ref 15–41)
Albumin: 4.4 g/dL (ref 3.5–5.0)
Alkaline Phosphatase: 81 U/L (ref 38–126)
Anion gap: 7 (ref 5–15)
BUN: 25 mg/dL — ABNORMAL HIGH (ref 8–23)
CO2: 29 mmol/L (ref 22–32)
Calcium: 9.5 mg/dL (ref 8.9–10.3)
Chloride: 103 mmol/L (ref 98–111)
Creatinine: 0.75 mg/dL (ref 0.44–1.00)
GFR, Estimated: >60 mL/min (ref >60)
Glucose, Bld: 104 mg/dL — ABNORMAL HIGH (ref 70–99)
Potassium: 4.1 mmol/L (ref 3.5–5.1)
Sodium: 139 mmol/L (ref 135–145)
Total Bilirubin: 0.8 mg/dL (ref 0.3–1.2)
Total Protein: 6.9 g/dL (ref 6.5–8.1)

## 2021-08-12 NOTE — Progress Notes (Signed)
[place]   [phone] [phone]) [phone]   Clinic Follow up Note   Patient Care Team: [name], MD as PCP - General (Family Medicine) Truitt Merle, MD as Consulting Physician (Oncology) Royston Bake, RN as Oncology Nurse Navigator (Oncology)  Date of Service:  08/12/2021  CHIEF COMPLAINT: f/u of colon cancer  CURRENT THERAPY:  Adjuvant Xeloda, started 05/03/21 -current dose: 1569m AM, 10064mPM on day 1-14 every 21 days.   ASSESSMENT & PLAN:  Vickie Holloway a [age].o. female with   1. Cancer of Right Colon, Stage IIIB p(T3, N1aM0), MSI-S -presented to ED with RLQ pain on 02/08/21. Colonoscopy on 02/24/21 showed a partially-obstructing tumor in ascending colon. Pathology showed adenocarcinoma. Staging chest CT on 03/05/21 was negative. -right hemi-colectomy on 04/05/21 with Dr. WhDema Severinhowed 4.7 cm invasive moderately differentiated adenocarcinoma. Margins were negative, one lymph node was positive (1/36). -given the positive lymph node, she initiated adjuvant chemotherapy with Xeloda 150090mID for 14 days on, 7 days off starting 05/03/21, plan to continue for 6 months. -Starting cycle 3, we dose reduced Xeloda to 1500 mg in the AM and 1000 mg in the PM.  -Labs reviewed, overall stable, adequate to continue treatment. She continues to tolerate well overall, skin toxicity well managed.   2. Anxiety and depression -continue meds per PCP, I prescribed Ambien 07/02/21.    PLAN:  -continue Xeloda at same dose, she is tolerating well, start C6 next Monday 5/29 -Labs and follow up in 3 weeks   No problem-specific Assessment & Plan notes found for this encounter.   SUMMARY OF ONCOLOGIC HISTORY: Oncology History Overview Note   Cancer Staging  [facility] form: Colon and Rectum, AJCC 8th Edition - Pathologic stage from 04/05/2021: Stage IIIB (pT3, pN1a, cM0) - Signed by FenTruitt MerleD on 04/23/2021    [facility]  [facility]   CLINICAL DATA:  Right lower quadrant abdominal pain. Nausea and fever.   EXAM: CT ABDOMEN AND PELVIS WITH CONTRAST  IMPRESSION: 1. Distal ascending colon with associated pericolonic fat stranding and prominent but nonenlarged mesenteric lymph nodes. Findings suggestive of malignancy. Differential diagnosis of infection. Recommend colonoscopy after course of infection treatment to exclude malignancy. 2. Tiny hiatal hernia. 3. Nonobstructive punctate left nephrolithiasis.   02/24/2021 Procedure   Colonoscopy, Dr. NanSilverio Decampmpression: - Rule out malignancy, polypoid lesion in the cecum. Biopsied. - Likely malignant partially obstructing tumor in the ascending colon. Biopsied. Tattooed. - Diverticulosis in the sigmoid colon. - Non-bleeding external and internal hemorrhoids.   02/24/2021 Initial Biopsy   Diagnosis 1. Cecum Biopsy, Mass - FRAGMENTS OF TUBULOVILLOUS ADENOMA. - NO HIGH GRADE DYSPLASIA OR INVASIVE CARCINOMA. 2. Ascending Colon Biopsy, Mass - ADENOCARCINOMA.   03/05/2021 Imaging   EXAM: CT CHEST WITH CONTRAST  IMPRESSION: No evidence of metastatic disease in the chest.   Heterogeneous, enlarged thyroid with multiple nodules measuring up to 2.2 cm. Recommend thyroid US [place]: J Am Coll Radiol. 2015 Feb;12(2): 143-50).   Coronary artery disease.   Aortic Atherosclerosis (ICD10-I70.0).   04/05/2021 Cancer Staging   Staging form: Colon and Rectum, AJCC 8th Edition - Pathologic stage from 04/05/2021: Stage IIIB (pT3, pN1a, cM0) - Signed by FenTruitt MerleD on 04/23/2021 Total positive nodes: 1 Histologic grading system: 4 grade system Histologic grade (G): G2 Residual tumor (R): R0 - None    04/05/2021 Definitive Surgery   FINAL MICROSCOPIC DIAGNOSIS:   A. COLON,  RIGHT, RESECTION:  - Invasive moderately differentiated adenocarcinoma.  - Separate tubulovillous adenoma.  - All surgical margins negative for dysplasia and malignancy.  -  One of thirty-six lymph nodes involved by metastatic adenocarcinoma (1/36).  - See Oncology Table.   ADDENDUM:  Mismatch Repair Protein (IHC)  SUMMARY INTERPRETATION: NORMAL    04/23/2021 Initial Diagnosis   Cancer of right colon ([place])       INTERVAL HISTORY:  Vickie Holloway is here for a follow up of colon cancer. She was last seen by PA Murray Hodgkins on 07/23/21. She presents to the clinic alone. She reports she is doing well overall. Her skin is stable. She reports the Vickie Holloway has been very helpful. She endorses only using it once in a while.   All other systems were reviewed with the patient and are negative.  MEDICAL HISTORY:  Past Medical History:  Diagnosis Date   Allergy    Anxiety    Arthritis    Colon cancer ([place])    Depression    Hypertension    Insomnia     SURGICAL HISTORY: Past Surgical History:  Procedure Laterality Date   ABDOMINAL HYSTERECTOMY  03/22/1983   CATARACT EXTRACTION W/ INTRAOCULAR LENS IMPLANT Left    COLONOSCOPY     COSMETIC SURGERY  03/21/1997   face lift   DILATION AND CURETTAGE OF UTERUS     LAPAROSCOPIC RIGHT HEMI COLECTOMY Right 04/05/2021   Procedure: LAPAROSCOPIC RIGHT HEMI COLECTOMY;  Surgeon: Ileana Roup, MD;  Location: [facility];  Service: General;  Laterality: Right;   ORIF WRIST FRACTURE Left 01/01/2013   Procedure: OPEN REDUCTION INTERNAL FIXATION (ORIF)OF LEFT DISTAL RADIUS FRACTURE;  Surgeon: Cammie Sickle., MD;  Location: [place];  Service: Orthopedics;  Laterality: Left;   REFRACTIVE SURGERY     TONSILLECTOMY      I have reviewed the social history and family history with the patient and they are unchanged from previous note.  ALLERGIES:  has No Known Allergies.  MEDICATIONS:  Current Outpatient Medications  Medication Sig Dispense Refill   Ascorbic Acid (VITAMIN C PO) Take 2 tablets by mouth daily.     CALCIUM PO Take by mouth daily.     capecitabine (XELODA) 500 MG tablet Take 3 tabs in morning  and 2 tabs in evening after meals, every 10-12 hours. Take for 14 days then off for 7 days. 70 tablet 2   ferrous sulfate 325 (65 FE) MG tablet Take 1 tablet (325 mg total) by mouth 2 (two) times daily with a meal for 14 days. 28 tablet 0   lamoTRIgine (LAMICTAL) 200 MG tablet Take 200 mg by mouth daily.     Magnesium 400 MG TABS Take 400 mg by mouth daily.     Multiple Vitamins-Minerals (ZINC PO) Take 1 tablet by mouth daily.     olmesartan (BENICAR) 20 MG tablet Take 20 mg by mouth daily.     ondansetron (ZOFRAN) 8 MG tablet Take 1 tablet (8 mg total) by mouth every 8 (eight) hours as needed for nausea or vomiting. 20 tablet 3   Pyridoxine HCl (VITAMIN B6 PO) Take by mouth 2 (two) times daily.     triamcinolone ointment (KENALOG) 0.5 % Apply 1 application. topically 2 (two) times daily. 30 g 0   urea (CARMOL) 10 % cream Apply topically 2 (two) times daily. 71 g 2   VITAMIN D PO Take 2,000 Units by mouth daily.     zolpidem (AMBIEN) 5 MG tablet  Take 1/2-1 tablet (2.5-5 mg total) by mouth at bedtime as needed for sleep. 30 tablet 0   No current facility-administered medications for this visit.    PHYSICAL EXAMINATION: ECOG PERFORMANCE STATUS: 1 - Symptomatic but completely ambulatory  Vitals:   08/12/21 1342  BP: (!) 143/78  Pulse: 75  Resp: 18  Temp: 98.9 F (37.2 C)  SpO2: 99%   Wt Readings from Last 3 Encounters:  08/12/21 124 lb 3.2 oz (56.3 kg)  06/11/21 124 lb 8 oz (56.5 kg)  05/17/21 123 lb 1.6 oz (55.8 kg)     GENERAL:alert, no distress and comfortable SKIN: skin color normal, no rashes or significant lesions EYES: normal, Conjunctiva are pink and non-injected, sclera clear  NEURO: alert & oriented x 3 with fluent speech  LABORATORY DATA:  I have reviewed the data as listed    Latest Ref Rng & Units 08/12/2021    1:15 PM 07/23/2021    8:11 AM 07/02/2021    1:45 PM  CBC  WBC 4.0 - 10.5 K/uL 5.8   4.4   3.8    Hemoglobin 12.0 - 15.0 g/dL 11.5   11.8   11.3     Hematocrit 36.0 - 46.0 % 33.7   35.3   35.2    Platelets 150 - 400 K/uL 242   234   213          Latest Ref Rng & Units 08/12/2021    1:15 PM 07/23/2021    8:11 AM 07/02/2021    1:45 PM  CMP  Glucose 70 - 99 mg/dL 104   86   96    BUN 8 - 23 mg/dL '25   19   16    ' Creatinine 0.44 - 1.00 mg/dL 0.75   0.80   0.78    Sodium 135 - 145 mmol/L 139   144   140    Potassium 3.5 - 5.1 mmol/L 4.1   4.2   4.0    Chloride 98 - 111 mmol/L 103   108   105    CO2 22 - 32 mmol/L 29   32   31    Calcium 8.9 - 10.3 mg/dL 9.5   9.4   9.8    Total Protein 6.5 - 8.1 g/dL 6.9   6.6   6.7    Total Bilirubin 0.3 - 1.2 mg/dL 0.8   0.9   0.8    Alkaline Phos 38 - 126 U/L 81   92   94    AST 15 - 41 U/L '22   26   27    ' ALT 0 - 44 U/L '17   18   22        ' RADIOGRAPHIC STUDIES: I have personally reviewed the radiological images as listed and agreed with the findings in the report. No results found.    No orders of the defined types were placed in this encounter.  All questions were answered. The patient knows to call the clinic with any problems, questions or concerns. No barriers to learning was detected. The total time spent in the appointment was 30 minutes.     Truitt Merle, MD 08/12/2021   I, Wilburn Mylar, am acting as scribe for Truitt Merle, MD.   I have reviewed the above documentation for accuracy and completeness, and I agree with the above.

## 2021-08-31 ENCOUNTER — Other Ambulatory Visit: Payer: Self-pay | Admitting: Hematology

## 2021-08-31 ENCOUNTER — Other Ambulatory Visit (HOSPITAL_COMMUNITY): Payer: Self-pay

## 2021-08-31 MED ORDER — ZOLPIDEM TARTRATE 5 MG PO TABS
2.5000 mg | ORAL_TABLET | Freq: Every evening | ORAL | 0 refills | Status: DC | PRN
  Filled 2021-08-31 – 2021-09-03 (×2): qty 30, 30d supply, fill #0

## 2021-09-01 ENCOUNTER — Other Ambulatory Visit (HOSPITAL_COMMUNITY): Payer: Self-pay

## 2021-09-03 ENCOUNTER — Other Ambulatory Visit: Payer: Self-pay

## 2021-09-03 ENCOUNTER — Inpatient Hospital Stay: Payer: Medicare Other | Attending: Hematology

## 2021-09-03 ENCOUNTER — Encounter: Payer: Self-pay | Admitting: Hematology

## 2021-09-03 ENCOUNTER — Inpatient Hospital Stay (HOSPITAL_BASED_OUTPATIENT_CLINIC_OR_DEPARTMENT_OTHER): Payer: Medicare Other | Admitting: Hematology

## 2021-09-03 ENCOUNTER — Other Ambulatory Visit (HOSPITAL_COMMUNITY): Payer: Self-pay

## 2021-09-03 VITALS — BP 138/75 | HR 73 | Temp 98.1°F | Resp 16 | Ht 65.0 in | Wt 122.7 lb

## 2021-09-03 DIAGNOSIS — C182 Malignant neoplasm of ascending colon: Secondary | ICD-10-CM

## 2021-09-03 DIAGNOSIS — I2510 Atherosclerotic heart disease of native coronary artery without angina pectoris: Secondary | ICD-10-CM | POA: Insufficient documentation

## 2021-09-03 DIAGNOSIS — I10 Essential (primary) hypertension: Secondary | ICD-10-CM | POA: Insufficient documentation

## 2021-09-03 DIAGNOSIS — I700 Atherosclerosis of aorta: Secondary | ICD-10-CM | POA: Insufficient documentation

## 2021-09-03 DIAGNOSIS — F418 Other specified anxiety disorders: Secondary | ICD-10-CM | POA: Diagnosis not present

## 2021-09-03 DIAGNOSIS — Z90710 Acquired absence of both cervix and uterus: Secondary | ICD-10-CM | POA: Insufficient documentation

## 2021-09-03 LAB — CBC WITH DIFFERENTIAL/PLATELET
Abs Immature Granulocytes: 0.02 10*3/uL (ref 0.00–0.07)
Basophils Absolute: 0 10*3/uL (ref 0.0–0.1)
Basophils Relative: 1 %
Eosinophils Absolute: 0.1 10*3/uL (ref 0.0–0.5)
Eosinophils Relative: 1 %
HCT: 35.2 % — ABNORMAL LOW (ref 36.0–46.0)
Hemoglobin: 12.3 g/dL (ref 12.0–15.0)
Immature Granulocytes: 0 %
Lymphocytes Relative: 17 %
Lymphs Abs: 1.2 10*3/uL (ref 0.7–4.0)
MCH: 36.9 pg — ABNORMAL HIGH (ref 26.0–34.0)
MCHC: 34.9 g/dL (ref 30.0–36.0)
MCV: 105.7 fL — ABNORMAL HIGH (ref 80.0–100.0)
Monocytes Absolute: 0.8 10*3/uL (ref 0.1–1.0)
Monocytes Relative: 11 %
Neutro Abs: 4.8 10*3/uL (ref 1.7–7.7)
Neutrophils Relative %: 70 %
Platelets: 258 10*3/uL (ref 150–400)
RBC: 3.33 MIL/uL — ABNORMAL LOW (ref 3.87–5.11)
RDW: 19.5 % — ABNORMAL HIGH (ref 11.5–15.5)
WBC: 6.8 10*3/uL (ref 4.0–10.5)
nRBC: 0 % (ref 0.0–0.2)

## 2021-09-03 LAB — COMPREHENSIVE METABOLIC PANEL
ALT: 12 U/L (ref 0–44)
AST: 20 U/L (ref 15–41)
Albumin: 4.4 g/dL (ref 3.5–5.0)
Alkaline Phosphatase: 69 U/L (ref 38–126)
Anion gap: 5 (ref 5–15)
BUN: 25 mg/dL — ABNORMAL HIGH (ref 8–23)
CO2: 30 mmol/L (ref 22–32)
Calcium: 9.8 mg/dL (ref 8.9–10.3)
Chloride: 104 mmol/L (ref 98–111)
Creatinine, Ser: 0.77 mg/dL (ref 0.44–1.00)
GFR, Estimated: >60 mL/min (ref >60)
Glucose, Bld: 92 mg/dL (ref 70–99)
Potassium: 4.2 mmol/L (ref 3.5–5.1)
Sodium: 139 mmol/L (ref 135–145)
Total Bilirubin: 0.8 mg/dL (ref 0.3–1.2)
Total Protein: 6.9 g/dL (ref 6.5–8.1)

## 2021-09-03 MED ORDER — CAPECITABINE 500 MG PO TABS
ORAL_TABLET | ORAL | 0 refills | Status: DC
  Filled 2021-09-03: qty 70, fill #0
  Filled 2021-09-16: qty 70, 21d supply, fill #0

## 2021-09-03 NOTE — Progress Notes (Signed)
[place]   Telephone:(336) [phone] Fax:(336) [phone]   Clinic Follow up Note   Patient Care Team: [name], MD as PCP - General (Family Medicine) Truitt Merle, MD as Consulting Physician (Oncology) Royston Bake, RN as Oncology Nurse Navigator (Oncology)  Date of Service:  09/03/2021  CHIEF COMPLAINT: f/u of colon cancer  CURRENT THERAPY:  Adjuvant Xeloda, started 05/03/21 -current dose: 1534m AM, 10068mPM on day 1-14 every 21 days.   ASSESSMENT & PLAN:  Vickie Holloway a [age].o. female with   1. Cancer of Right Colon, Stage IIIB p(T3, N1aM0), MSI-S -presented to ED with RLQ pain on 02/08/21. Colonoscopy on 02/24/21 showed a partially-obstructing tumor in ascending colon. Pathology showed adenocarcinoma. Staging chest CT on 03/05/21 was negative. -right hemi-colectomy on 04/05/21 with Dr. WhDema Severinhowed 4.7 cm invasive moderately differentiated adenocarcinoma. Margins were negative, one lymph node was positive (1/36). -given the positive lymph node, she started adjuvant chemotherapy with Xeloda 15004mID for 14 days on, 7 days off starting 05/03/21, plan to continue for 6 months. Dose reduced with C3. -Labs reviewed, overall stable to improved, adequate to continue treatment. She continues to tolerate well overall, skin toxicity well managed. -she notes she will be taking vacation in early July. I recommend she take an extra week off of Xeloda. Thus, she will start cycle 7 on Monday, 6/19, and her final cycle on 7/17.   2. Anxiety and depression -continue meds per PCP, I prescribed Ambien 07/02/21. -she is currently experiencing a lot of life stressors. I reminded her we have social worker if she ever needs.     PLAN:  -continue Xeloda at same dose, she is tolerating well, start C7 on Monday 6/19 -Labs and follow up on 7/17 before last cycle xeloda    No problem-specific Assessment & Plan notes found for this encounter.   SUMMARY OF ONCOLOGIC  HISTORY: Oncology History Overview Note   Cancer Staging  [facility] right colon ([facility] form: Colon and Rectum, AJCC 8th Edition - Pathologic stage from 04/05/2021: Stage IIIB (pT3, pN1a, cM0) - Signed by FenTruitt MerleD on 04/23/2021    [facility] ([facility]   CLINICAL DATA:  Right lower quadrant abdominal pain. Nausea and fever.   EXAM: CT ABDOMEN AND PELVIS WITH CONTRAST  IMPRESSION: 1. Distal ascending colon with associated pericolonic fat stranding and prominent but nonenlarged mesenteric lymph nodes. Findings suggestive of malignancy. Differential diagnosis of infection. Recommend colonoscopy after course of infection treatment to exclude malignancy. 2. Tiny hiatal hernia. 3. Nonobstructive punctate left nephrolithiasis.   02/24/2021 Procedure   Colonoscopy, Dr. NanSilverio Decampmpression: - Rule out malignancy, polypoid lesion in the cecum. Biopsied. - Likely malignant partially obstructing tumor in the ascending colon. Biopsied. Tattooed. - Diverticulosis in the sigmoid colon. - Non-bleeding external and internal hemorrhoids.   02/24/2021 Initial Biopsy   Diagnosis 1. Cecum Biopsy, Mass - FRAGMENTS OF TUBULOVILLOUS ADENOMA. - NO HIGH GRADE DYSPLASIA OR INVASIVE CARCINOMA. 2. Ascending Colon Biopsy, Mass - ADENOCARCINOMA.   03/05/2021 Imaging   EXAM: CT CHEST WITH CONTRAST  IMPRESSION: No evidence of metastatic disease in the chest.   Heterogeneous, enlarged thyroid with multiple nodules measuring up to 2.2 cm. Recommend thyroid US [place]ef: J Am Coll Radiol. 2015 Feb;12(2): 143-50).   Coronary artery disease.   Aortic Atherosclerosis (ICD10-I70.0).   04/05/2021 Cancer Staging   Staging form: Colon and Rectum, AJCC 8th Edition - Pathologic stage from 04/05/2021: Stage IIIB (  pT3, pN1a, cM0) - Signed by Truitt Merle, MD on 04/23/2021 Total positive nodes: 1 Histologic grading system: 4 grade system Histologic grade (G): G2 Residual tumor (R):  R0 - None   04/05/2021 Definitive Surgery   FINAL MICROSCOPIC DIAGNOSIS:   A. COLON, RIGHT, RESECTION:  - Invasive moderately differentiated adenocarcinoma.  - Separate tubulovillous adenoma.  - All surgical margins negative for dysplasia and malignancy.  - One of thirty-six lymph nodes involved by metastatic adenocarcinoma (1/36).  - See Oncology Table.   ADDENDUM:  Mismatch Repair Protein (IHC)  SUMMARY INTERPRETATION: NORMAL    04/23/2021 Initial Diagnosis   Cancer of right colon ([place])      INTERVAL HISTORY:  Vickie Holloway is here for a follow up of colon cancer. She was last seen by me on 08/12/21. She presents to the clinic alone. She reports she is doing well aside from life stressors. She notes anxiety and decreased appetite from the stress. She reports the skin toxicity from Xeloda is mild and manageable.   All other systems were reviewed with the patient and are negative.  MEDICAL HISTORY:  Past Medical History:  Diagnosis Date   Allergy    Anxiety    Arthritis    Colon cancer ([place])    Depression    Hypertension    Insomnia     SURGICAL HISTORY: Past Surgical History:  Procedure Laterality Date   ABDOMINAL HYSTERECTOMY  03/22/1983   CATARACT EXTRACTION W/ INTRAOCULAR LENS IMPLANT Left    COLONOSCOPY     COSMETIC SURGERY  03/21/1997   face lift   DILATION AND CURETTAGE OF UTERUS     LAPAROSCOPIC RIGHT HEMI COLECTOMY Right 04/05/2021   Procedure: LAPAROSCOPIC RIGHT HEMI COLECTOMY;  Surgeon: Ileana Roup, MD;  Location: [facility];  Service: General;  Laterality: Right;   ORIF WRIST FRACTURE Left 01/01/2013   Procedure: OPEN REDUCTION INTERNAL FIXATION (ORIF)OF LEFT DISTAL RADIUS FRACTURE;  Surgeon: Cammie Sickle., MD;  Location: [facility];  Service: Orthopedics;  Laterality: Left;   REFRACTIVE SURGERY     TONSILLECTOMY      I have reviewed the social history and family history with the patient and they are unchanged from previous  note.  ALLERGIES:  has No Known Allergies.  MEDICATIONS:  Current Outpatient Medications  Medication Sig Dispense Refill   Ascorbic Acid (VITAMIN C PO) Take 2 tablets by mouth daily.     CALCIUM PO Take by mouth daily.     capecitabine (XELODA) 500 MG tablet Take 3 tabs in morning and 2 tabs in evening after meals, every 10-12 hours. Take for 14 days then off for 7 days. 70 tablet 0   ferrous sulfate 325 (65 FE) MG tablet Take 1 tablet (325 mg total) by mouth 2 (two) times daily with a meal for 14 days. 28 tablet 0   lamoTRIgine (LAMICTAL) 200 MG tablet Take 200 mg by mouth daily.     Magnesium 400 MG TABS Take 400 mg by mouth daily.     Multiple Vitamins-Minerals (ZINC PO) Take 1 tablet by mouth daily.     olmesartan (BENICAR) 20 MG tablet Take 20 mg by mouth daily.     ondansetron (ZOFRAN) 8 MG tablet Take 1 tablet (8 mg total) by mouth every 8 (eight) hours as needed for nausea or vomiting. 20 tablet 3   Pyridoxine HCl (VITAMIN B6 PO) Take by mouth 2 (two) times daily.     triamcinolone ointment (KENALOG) 0.5 % Apply 1  application. topically 2 (two) times daily. 30 g 0   urea (CARMOL) 10 % cream Apply topically 2 (two) times daily. 71 g 2   VITAMIN D PO Take 2,000 Units by mouth daily.     zolpidem (AMBIEN) 5 MG tablet Take 1/2-1 tablet (2.5-5 mg total) by mouth at bedtime as needed for sleep. 30 tablet 0   No current facility-administered medications for this visit.    PHYSICAL EXAMINATION: ECOG PERFORMANCE STATUS: 1 - Symptomatic but completely ambulatory  Vitals:   09/03/21 1407  BP: 138/75  Pulse: 73  Resp: 16  Temp: 98.1 F (36.7 C)  SpO2: 100%   Wt Readings from Last 3 Encounters:  09/03/21 122 lb 11.2 oz (55.7 kg)  08/12/21 124 lb 3.2 oz (56.3 kg)  06/11/21 124 lb 8 oz (56.5 kg)     GENERAL:alert, no distress and comfortable SKIN: skin color normal, no rashes or significant lesions EYES: normal, Conjunctiva are pink and non-injected, sclera clear  NEURO: alert  & oriented x 3 with fluent speech  LABORATORY DATA:  I have reviewed the data as listed    Latest Ref Rng & Units 09/03/2021    1:14 PM 08/12/2021    1:15 PM 07/23/2021    8:11 AM  CBC  WBC 4.0 - 10.5 K/uL 6.8  5.8  4.4   Hemoglobin 12.0 - 15.0 g/dL 12.3  11.5  11.8   Hematocrit 36.0 - 46.0 % 35.2  33.7  35.3   Platelets 150 - 400 K/uL 258  242  234         Latest Ref Rng & Units 09/03/2021    1:14 PM 08/12/2021    1:15 PM 07/23/2021    8:11 AM  CMP  Glucose 70 - 99 mg/dL 92  104  86   BUN 8 - 23 mg/dL '25  25  19   ' Creatinine 0.44 - 1.00 mg/dL 0.77  0.75  0.80   Sodium 135 - 145 mmol/L 139  139  144   Potassium 3.5 - 5.1 mmol/L 4.2  4.1  4.2   Chloride 98 - 111 mmol/L 104  103  108   CO2 22 - 32 mmol/L 30  29  32   Calcium 8.9 - 10.3 mg/dL 9.8  9.5  9.4   Total Protein 6.5 - 8.1 g/dL 6.9  6.9  6.6   Total Bilirubin 0.3 - 1.2 mg/dL 0.8  0.8  0.9   Alkaline Phos 38 - 126 U/L 69  81  92   AST 15 - 41 U/L '20  22  26   ' ALT 0 - 44 U/L '12  17  18       ' RADIOGRAPHIC STUDIES: I have personally reviewed the radiological images as listed and agreed with the findings in the report. No results found.    No orders of the defined types were placed in this encounter.  All questions were answered. The patient knows to call the clinic with any problems, questions or concerns. No barriers to learning was detected. The total time spent in the appointment was 30 minutes.     Truitt Merle, MD 09/03/2021   I, Wilburn Mylar, am acting as scribe for Truitt Merle, MD.   I have reviewed the above documentation for accuracy and completeness, and I agree with the above.

## 2021-09-16 ENCOUNTER — Other Ambulatory Visit (HOSPITAL_COMMUNITY): Payer: Self-pay

## 2021-09-22 ENCOUNTER — Other Ambulatory Visit (HOSPITAL_COMMUNITY): Payer: Self-pay

## 2021-10-04 ENCOUNTER — Inpatient Hospital Stay: Payer: Medicare Other

## 2021-10-04 ENCOUNTER — Encounter: Payer: Self-pay | Admitting: Hematology

## 2021-10-04 ENCOUNTER — Inpatient Hospital Stay: Payer: Medicare Other | Attending: Hematology | Admitting: Hematology

## 2021-10-04 ENCOUNTER — Other Ambulatory Visit: Payer: Self-pay

## 2021-10-04 VITALS — BP 160/72 | HR 66 | Temp 97.9°F | Resp 16 | Ht 65.0 in | Wt 120.6 lb

## 2021-10-04 DIAGNOSIS — C182 Malignant neoplasm of ascending colon: Secondary | ICD-10-CM

## 2021-10-04 DIAGNOSIS — I700 Atherosclerosis of aorta: Secondary | ICD-10-CM | POA: Diagnosis not present

## 2021-10-04 DIAGNOSIS — I10 Essential (primary) hypertension: Secondary | ICD-10-CM | POA: Diagnosis not present

## 2021-10-04 DIAGNOSIS — Z90710 Acquired absence of both cervix and uterus: Secondary | ICD-10-CM | POA: Diagnosis not present

## 2021-10-04 DIAGNOSIS — I2510 Atherosclerotic heart disease of native coronary artery without angina pectoris: Secondary | ICD-10-CM | POA: Diagnosis not present

## 2021-10-04 DIAGNOSIS — F418 Other specified anxiety disorders: Secondary | ICD-10-CM | POA: Diagnosis not present

## 2021-10-04 LAB — COMPREHENSIVE METABOLIC PANEL
ALT: 14 U/L (ref 0–44)
AST: 21 U/L (ref 15–41)
Albumin: 4.4 g/dL (ref 3.5–5.0)
Alkaline Phosphatase: 78 U/L (ref 38–126)
Anion gap: 3 — ABNORMAL LOW (ref 5–15)
BUN: 11 mg/dL (ref 8–23)
CO2: 32 mmol/L (ref 22–32)
Calcium: 9.6 mg/dL (ref 8.9–10.3)
Chloride: 105 mmol/L (ref 98–111)
Creatinine, Ser: 0.75 mg/dL (ref 0.44–1.00)
GFR, Estimated: >60 mL/min (ref >60)
Glucose, Bld: 112 mg/dL — ABNORMAL HIGH (ref 70–99)
Potassium: 4.2 mmol/L (ref 3.5–5.1)
Sodium: 140 mmol/L (ref 135–145)
Total Bilirubin: 1 mg/dL (ref 0.3–1.2)
Total Protein: 6.9 g/dL (ref 6.5–8.1)

## 2021-10-04 LAB — CBC WITH DIFFERENTIAL/PLATELET
Abs Immature Granulocytes: 0.01 10*3/uL (ref 0.00–0.07)
Basophils Absolute: 0.1 10*3/uL (ref 0.0–0.1)
Basophils Relative: 1 %
Eosinophils Absolute: 0 10*3/uL (ref 0.0–0.5)
Eosinophils Relative: 1 %
HCT: 38.9 % (ref 36.0–46.0)
Hemoglobin: 13.3 g/dL (ref 12.0–15.0)
Immature Granulocytes: 0 %
Lymphocytes Relative: 20 %
Lymphs Abs: 1 10*3/uL (ref 0.7–4.0)
MCH: 37.2 pg — ABNORMAL HIGH (ref 26.0–34.0)
MCHC: 34.2 g/dL (ref 30.0–36.0)
MCV: 108.7 fL — ABNORMAL HIGH (ref 80.0–100.0)
Monocytes Absolute: 0.5 10*3/uL (ref 0.1–1.0)
Monocytes Relative: 10 %
Neutro Abs: 3.2 10*3/uL (ref 1.7–7.7)
Neutrophils Relative %: 68 %
Platelets: 215 10*3/uL (ref 150–400)
RBC: 3.58 MIL/uL — ABNORMAL LOW (ref 3.87–5.11)
RDW: 15.9 % — ABNORMAL HIGH (ref 11.5–15.5)
WBC: 4.7 10*3/uL (ref 4.0–10.5)
nRBC: 0 % (ref 0.0–0.2)

## 2021-10-04 LAB — FERRITIN: Ferritin: 24 ng/mL (ref 11–307)

## 2021-10-04 MED ORDER — ZOLPIDEM TARTRATE 5 MG PO TABS: 2.5000 mg | ORAL_TABLET | Freq: Every evening | ORAL | 0 refills | Status: DC | PRN

## 2021-10-04 NOTE — Progress Notes (Signed)
[name]   Telephone:(336) [phone] Fax:(336) [phone]   Clinic Follow up Note   Patient Care Team: Vickie Rasmussen, MD as PCP - General (Family Medicine) Vickie Merle, MD as Consulting Physician (Oncology) Vickie Bake, RN as Oncology Nurse Navigator (Oncology)  Date of Service:  10/04/2021  CHIEF COMPLAINT: f/u of colon cancer  CURRENT THERAPY:  Adjuvant Xeloda, started 05/03/21 -current dose: 1523m AM, 10067mPM on day 1-14 every 21 days.   ASSESSMENT & PLAN:  CaYarielys Holloway a [age].o. female with   1. Cancer of Right Colon, Stage IIIB p(T3, N1aM0), MSI-S -presented to ED with RLQ pain on 02/08/21. Colonoscopy on 02/24/21 showed a partially-obstructing tumor in ascending colon. Pathology showed adenocarcinoma. Staging chest CT on 03/05/21 was negative. -right hemi-colectomy on 04/05/21 with Dr. WhDema Severinhowed 4.7 cm invasive moderately differentiated adenocarcinoma. Margins were negative, one lymph node was positive (1/36). -given the positive lymph node, she started adjuvant chemotherapy with Xeloda 150075mID for 14 days on, 7 days off starting 05/03/21, plan to continue for 6 months. Dose reduced with C3. -Labs reviewed, overall stable to improved, adequate to continue treatment. She continues to tolerate well overall, skin toxicity well managed. She began her final cycle today. -plan for first surveillance CT in end of 10/2021.   2. Anxiety and depression -continue meds per PCP, I prescribed Ambien 07/02/21. -I will refill her Ambien, but I advised her to discuss with her PCP once she is off treatment.     PLAN:  -continue final cycle Xeloda at same dose -I refilled ambien, she will use as needed only  -f/u in 6 weeks with lab and CT several days before   No problem-specific Assessment & Plan notes found for this encounter.   SUMMARY OF ONCOLOGIC HISTORY: Oncology History Overview Note   Cancer Staging  [facility] of right colon ([facility] form: Colon  and Rectum, AJCC 8th Edition - Pathologic stage from 04/05/2021: Stage IIIB (pT3, pN1a, cM0) - Signed by Vickie Holloway on 04/23/2021    [facility] right colon ([facility]   CLINICAL DATA:  Right lower quadrant abdominal pain. Nausea and fever.   EXAM: CT ABDOMEN AND PELVIS WITH CONTRAST  IMPRESSION: 1. Distal ascending colon with associated pericolonic fat stranding and prominent but nonenlarged mesenteric lymph nodes. Findings suggestive of malignancy. Differential diagnosis of infection. Recommend colonoscopy after course of infection treatment to exclude malignancy. 2. Tiny hiatal hernia. 3. Nonobstructive punctate left nephrolithiasis.   02/24/2021 Procedure   Colonoscopy, Dr. NanSilverio Decampmpression: - Rule out malignancy, polypoid lesion in the cecum. Biopsied. - Likely malignant partially obstructing tumor in the ascending colon. Biopsied. Tattooed. - Diverticulosis in the sigmoid colon. - Non-bleeding external and internal hemorrhoids.   02/24/2021 Initial Biopsy   Diagnosis 1. Cecum Biopsy, Mass - FRAGMENTS OF TUBULOVILLOUS ADENOMA. - NO HIGH GRADE DYSPLASIA OR INVASIVE CARCINOMA. 2. Ascending Colon Biopsy, Mass - ADENOCARCINOMA.   03/05/2021 Imaging   EXAM: CT CHEST WITH CONTRAST  IMPRESSION: No evidence of metastatic disease in the chest.   Heterogeneous, enlarged thyroid with multiple nodules measuring up to 2.2 cm. Recommend thyroid US [place]ef: J Am Coll Radiol. 2015 Feb;12(2): 143-50).   Coronary artery disease.   Aortic Atherosclerosis (ICD10-I70.0).   04/05/2021 Cancer Staging   Staging form: Colon and Rectum, AJCC 8th Edition - Pathologic stage from 04/05/2021: Stage IIIB (pT3, pN1a, cM0) - Signed by Vickie Holloway on 04/23/2021 Total positive nodes: 1 Histologic grading system:  4 grade system Histologic grade (G): G2 Residual tumor (R): R0 - None   04/05/2021 Definitive Surgery   FINAL MICROSCOPIC DIAGNOSIS:   A. COLON, RIGHT, RESECTION:  -  Invasive moderately differentiated adenocarcinoma.  - Separate tubulovillous adenoma.  - All surgical margins negative for dysplasia and malignancy.  - One of thirty-six lymph nodes involved by metastatic adenocarcinoma (1/36).  - See Oncology Table.   ADDENDUM:  Mismatch Repair Protein (IHC)  SUMMARY INTERPRETATION: NORMAL    04/23/2021 Initial Diagnosis   Cancer of right colon ([place])      INTERVAL HISTORY:  Vickie Holloway is here for a follow up of colon cancer. She was last seen by me on 09/03/21. She presents to the clinic alone. She reports she is doing well overall. She notes she enjoyed her extra week off with her beach trip.   All other systems were reviewed with the patient and are negative.  MEDICAL HISTORY:  Past Medical History:  Diagnosis Date   Allergy    Anxiety    Arthritis    Colon cancer ([place])    Depression    Hypertension    Insomnia     SURGICAL HISTORY: Past Surgical History:  Procedure Laterality Date   ABDOMINAL HYSTERECTOMY  03/22/1983   CATARACT EXTRACTION W/ INTRAOCULAR LENS IMPLANT Left    COLONOSCOPY     COSMETIC SURGERY  03/21/1997   face lift   DILATION AND CURETTAGE OF UTERUS     LAPAROSCOPIC RIGHT HEMI COLECTOMY Right 04/05/2021   Procedure: LAPAROSCOPIC RIGHT HEMI COLECTOMY;  Surgeon: Vickie Roup, MD;  Location: [facility];  Service: General;  Laterality: Right;   ORIF WRIST FRACTURE Left 01/01/2013   Procedure: OPEN REDUCTION INTERNAL FIXATION (ORIF)OF LEFT DISTAL RADIUS FRACTURE;  Surgeon: Vickie Sickle., MD;  Location: [place];  Service: Orthopedics;  Laterality: Left;   REFRACTIVE SURGERY     TONSILLECTOMY      I have reviewed the social history and family history with the patient and they are unchanged from previous note.  ALLERGIES:  has No Known Allergies.  MEDICATIONS:  Current Outpatient Medications  Medication Sig Dispense Refill   Ascorbic Acid (VITAMIN C PO) Take 2 tablets by mouth daily.      CALCIUM PO Take by mouth daily.     capecitabine (XELODA) 500 MG tablet Take 3 tabs in morning and 2 tabs in evening after meals, every 10-12 hours. Take for 14 days then off for 7 days. 70 tablet 0   ferrous sulfate 325 (65 FE) MG tablet Take 1 tablet (325 mg total) by mouth 2 (two) times daily with a meal for 14 days. 28 tablet 0   lamoTRIgine (LAMICTAL) 200 MG tablet Take 200 mg by mouth daily.     Magnesium 400 MG TABS Take 400 mg by mouth daily.     Multiple Vitamins-Minerals (ZINC PO) Take 1 tablet by mouth daily.     olmesartan (BENICAR) 20 MG tablet Take 20 mg by mouth daily.     ondansetron (ZOFRAN) 8 MG tablet Take 1 tablet (8 mg total) by mouth every 8 (eight) hours as needed for nausea or vomiting. 20 tablet 3   Pyridoxine HCl (VITAMIN B6 PO) Take by mouth 2 (two) times daily.     triamcinolone ointment (KENALOG) 0.5 % Apply 1 application. topically 2 (two) times daily. 30 g 0   urea (CARMOL) 10 % cream Apply topically 2 (two) times daily. 71 g 2   VITAMIN D  PO Take 2,000 Units by mouth daily.     zolpidem (AMBIEN) 5 MG tablet Take 1/2-1 tablet (2.5-5 mg total) by mouth at bedtime as needed for sleep. 20 tablet 0   No current facility-administered medications for this visit.    PHYSICAL EXAMINATION: ECOG PERFORMANCE STATUS: 1 - Symptomatic but completely ambulatory  Vitals:   10/04/21 1051  BP: (!) 160/72  Pulse: 66  Resp: 16  Temp: 97.9 F (36.6 C)  SpO2: 100%   Wt Readings from Last 3 Encounters:  10/04/21 120 lb 9.6 oz (54.7 kg)  09/03/21 122 lb 11.2 oz (55.7 kg)  08/12/21 124 lb 3.2 oz (56.3 kg)     GENERAL:alert, no distress and comfortable SKIN: skin color normal, no rashes or significant lesions EYES: normal, Conjunctiva are pink and non-injected, sclera clear  NEURO: alert & oriented x 3 with fluent speech  LABORATORY DATA:  I have reviewed the data as listed    Latest Ref Rng & Units 10/04/2021   10:07 AM 09/03/2021    1:14 PM 08/12/2021    1:15 PM   CBC  WBC 4.0 - 10.5 K/uL 4.7  6.8  5.8   Hemoglobin 12.0 - 15.0 g/dL 13.3  12.3  11.5   Hematocrit 36.0 - 46.0 % 38.9  35.2  33.7   Platelets 150 - 400 K/uL 215  258  242         Latest Ref Rng & Units 10/04/2021   10:07 AM 09/03/2021    1:14 PM 08/12/2021    1:15 PM  CMP  Glucose 70 - 99 mg/dL 112  92  104   BUN 8 - 23 mg/dL _0 Creatinine 0.44 - 1.00 mg/dL 0.75  0.77  0.75   Sodium 135 - 145 mmol/L 140  139  139   Potassium 3.5 - 5.1 mmol/L 4.2  4.2  4.1   Chloride 98 - 111 mmol/L 105  104  103   CO2 22 - 32 mmol/L 32  30  29   Calcium 8.9 - 10.3 mg/dL 9.6  9.8  9.5   Total Protein 6.5 - 8.1 g/dL 6.9  6.9  6.9   Total Bilirubin 0.3 - 1.2 mg/dL 1.0  0.8  0.8   Alkaline Phos 38 - 126 U/L 78  69  81   AST 15 - 41 U/L _1 ALT 0 - 44 U/L _2 RADIOGRAPHIC STUDIES: I have personally reviewed the radiological images as listed and agreed with the findings in the report. No results found.    Orders Placed This Encounter  Procedures   CT CHEST ABDOMEN PELVIS W CONTRAST    Standing Status:   Future    Standing Expiration Date:   10/05/2022    Order Specific Question:   Preferred imaging location?    Answer:   [facility]    Order Specific Question:   Release to patient    Answer:   Manual release only    Order Specific Question:   Reason for preventing immediate release    Answer:   Patient request [1]    Order Specific Question:   Additional details for preventing immediate release    Answer:   none    Order Specific Question:   Is Oral Contrast requested for this exam?    Answer:   Yes, Per Radiology protocol   All questions  were answered. The patient knows to call the clinic with any problems, questions or concerns. No barriers to learning was detected. The total time spent in the appointment was 30 minutes.     Vickie Merle, MD 10/04/2021   I, Wilburn Mylar, am acting as scribe for Vickie Merle, MD.   I have reviewed the above  documentation for accuracy and completeness, and I agree with the above.

## 2021-10-05 ENCOUNTER — Other Ambulatory Visit (HOSPITAL_COMMUNITY): Payer: Self-pay

## 2021-11-11 ENCOUNTER — Inpatient Hospital Stay: Payer: Medicare Other | Attending: Hematology

## 2021-11-11 ENCOUNTER — Other Ambulatory Visit: Payer: Self-pay

## 2021-11-11 ENCOUNTER — Telehealth: Payer: Self-pay

## 2021-11-11 ENCOUNTER — Ambulatory Visit (HOSPITAL_COMMUNITY)
Admission: RE | Admit: 2021-11-11 | Discharge: 2021-11-11 | Disposition: A | Discharge status: Home / Self Care | Payer: Medicare Other | Source: Ambulatory Visit | Attending: Hematology | Admitting: Hematology

## 2021-11-11 DIAGNOSIS — C182 Malignant neoplasm of ascending colon: Secondary | ICD-10-CM | POA: Insufficient documentation

## 2021-11-11 DIAGNOSIS — F418 Other specified anxiety disorders: Secondary | ICD-10-CM | POA: Insufficient documentation

## 2021-11-11 DIAGNOSIS — Z90710 Acquired absence of both cervix and uterus: Secondary | ICD-10-CM | POA: Diagnosis not present

## 2021-11-11 DIAGNOSIS — I10 Essential (primary) hypertension: Secondary | ICD-10-CM | POA: Diagnosis not present

## 2021-11-11 DIAGNOSIS — I700 Atherosclerosis of aorta: Secondary | ICD-10-CM | POA: Insufficient documentation

## 2021-11-11 LAB — CBC WITH DIFFERENTIAL/PLATELET
Abs Immature Granulocytes: 0.02 10*3/uL (ref 0.00–0.07)
Basophils Absolute: 0 10*3/uL (ref 0.0–0.1)
Basophils Relative: 1 %
Eosinophils Absolute: 0 10*3/uL (ref 0.0–0.5)
Eosinophils Relative: 1 %
HCT: 41.9 % (ref 36.0–46.0)
Hemoglobin: 14.2 g/dL (ref 12.0–15.0)
Immature Granulocytes: 0 %
Lymphocytes Relative: 19 %
Lymphs Abs: 0.9 10*3/uL (ref 0.7–4.0)
MCH: 36.1 pg — ABNORMAL HIGH (ref 26.0–34.0)
MCHC: 33.9 g/dL (ref 30.0–36.0)
MCV: 106.6 fL — ABNORMAL HIGH (ref 80.0–100.0)
Monocytes Absolute: 0.4 10*3/uL (ref 0.1–1.0)
Monocytes Relative: 8 %
Neutro Abs: 3.4 10*3/uL (ref 1.7–7.7)
Neutrophils Relative %: 71 %
Platelets: 232 10*3/uL (ref 150–400)
RBC: 3.93 MIL/uL (ref 3.87–5.11)
RDW: 14.2 % (ref 11.5–15.5)
WBC: 4.8 10*3/uL (ref 4.0–10.5)
nRBC: 0 % (ref 0.0–0.2)

## 2021-11-11 LAB — COMPREHENSIVE METABOLIC PANEL
ALT: 18 U/L (ref 0–44)
AST: 26 U/L (ref 15–41)
Albumin: 4.6 g/dL (ref 3.5–5.0)
Alkaline Phosphatase: 81 U/L (ref 38–126)
Anion gap: 4 — ABNORMAL LOW (ref 5–15)
BUN: 22 mg/dL (ref 8–23)
CO2: 31 mmol/L (ref 22–32)
Calcium: 9.8 mg/dL (ref 8.9–10.3)
Chloride: 107 mmol/L (ref 98–111)
Creatinine, Ser: 0.55 mg/dL (ref 0.44–1.00)
GFR, Estimated: >60 mL/min (ref >60)
Glucose, Bld: 100 mg/dL — ABNORMAL HIGH (ref 70–99)
Potassium: 4.6 mmol/L (ref 3.5–5.1)
Sodium: 142 mmol/L (ref 135–145)
Total Bilirubin: 0.7 mg/dL (ref 0.3–1.2)
Total Protein: 7.1 g/dL (ref 6.5–8.1)

## 2021-11-11 LAB — CEA (IN HOUSE-CHCC): CEA (CHCC-In House): 1.5 ng/mL (ref 0.00–5.00)

## 2021-11-11 LAB — FERRITIN: Ferritin: 24 ng/mL (ref 11–307)

## 2021-11-11 MED ORDER — IOHEXOL 300 MG/ML  SOLN
100.0000 mL | Freq: Once | INTRAMUSCULAR | Status: AC | PRN
Start: 2021-11-11 — End: 2021-11-11
  Administered 2021-11-11: 100 mL via INTRAVENOUS

## 2021-11-11 MED ORDER — SODIUM CHLORIDE (PF) 0.9 % IJ SOLN
INTRAMUSCULAR | Status: AC
  Filled 2021-11-11: qty 50

## 2021-11-11 MED ORDER — IOHEXOL 300 MG/ML  SOLN
100.0000 mL | Freq: Once | INTRAMUSCULAR | Status: AC | PRN
  Administered 2021-11-11: 100 mL via INTRAVENOUS

## 2021-11-11 NOTE — Telephone Encounter (Signed)
Pt called stating she had labs drawn today at [facility] and have reviewed them.  Pt stated she has a few questions regarding her labs that she would like to discuss with Dr. Burr Medico.  Notified Dr. Burr Medico of the pt's request.

## 2021-11-15 ENCOUNTER — Inpatient Hospital Stay (HOSPITAL_BASED_OUTPATIENT_CLINIC_OR_DEPARTMENT_OTHER): Payer: Medicare Other | Admitting: Hematology

## 2021-11-15 ENCOUNTER — Other Ambulatory Visit: Payer: Self-pay

## 2021-11-15 VITALS — BP 153/78 | HR 79 | Temp 98.2°F | Resp 18 | Ht 65.0 in | Wt 120.8 lb

## 2021-11-15 DIAGNOSIS — E042 Nontoxic multinodular goiter: Secondary | ICD-10-CM | POA: Diagnosis not present

## 2021-11-15 DIAGNOSIS — C182 Malignant neoplasm of ascending colon: Secondary | ICD-10-CM | POA: Diagnosis not present

## 2021-11-15 DIAGNOSIS — R1900 Intra-abdominal and pelvic swelling, mass and lump, unspecified site: Secondary | ICD-10-CM

## 2021-11-15 NOTE — Progress Notes (Signed)
[place]   Telephone:(336) [phone] Fax:(336) [phone]   Clinic Follow up Note   Patient Care Team: [name], MD as PCP - General (Family Medicine) Truitt Merle, MD as Consulting Physician (Oncology) Royston Bake, RN as Oncology Nurse Navigator (Oncology)  Date of Service:  11/15/2021  CHIEF COMPLAINT: f/u of colon cancer  CURRENT THERAPY:  Surveillance  ASSESSMENT & PLAN:  Vickie Holloway is a 76 y.o. female with   1. Cancer of Right Colon, Stage IIIB p(T3, N1aM0), MSI-S -presented to ED with RLQ pain on 02/08/21. Colonoscopy on 02/24/21 showed a partially obstructing tumor in ascending colon. Pathology showed adenocarcinoma. Staging chest CT on 03/05/21 was negative. -right hemi-colectomy on 04/05/21 with Dr. Dema Severin showed 4.7 cm invasive moderately differentiated adenocarcinoma. Margins were negative, one lymph node was positive (1/36). -given the positive lymph node, she completed adjuvant Xeloda 05/03/21 - 10/17/21. -surveillance CT CAP 11/11/21 showed: no convincing evidence of metastatic disease; clustered nodularity in RLL favored infectious/inflammatory; prominent left ovary; stable nodular enhancement of thyroid.  -I reviewed the results with her today. I recommend [place] of pelvis and thyroid. I reassured her that the incidental findings in her lungs are not highly concerning for cancer but should be monitored with a repeat scan in 3-6 months. They had a lot of questions/concerns about her risk of recurrence; I answered to the best of my ability and provided reassurance. -Labs 8/24 reviewed, overall stable to improved off treatment.  2. Anxiety and depression -continue meds per PCP -she reports recent stressors involving her children. I offered psychologist referral, she agrees.  3. Incidental findings -CT CAP 11/11/21 showed prominent left ovary and nodular enhancement of thyroid.  -I recommend thyroid and pelvic US's to further evaluate these; I ordered  today.     PLAN:  -thyroid US and pelvic US to be done soon -phone visit after completing US's -psychology referral to Dr. Michail Sermon     No problem-specific Assessment & Plan notes found for this encounter.   SUMMARY OF ONCOLOGIC HISTORY: Oncology History Overview Note   Cancer Staging  Cancer of right colon [facility]) Staging form: Colon and Rectum, AJCC 8th Edition - Pathologic stage from 04/05/2021: Stage IIIB (pT3, pN1a, cM0) - Signed by Truitt Merle, MD on 04/23/2021    Cancer of right colon [facility])  02/08/2021 Imaging   CLINICAL DATA:  Right lower quadrant abdominal pain. Nausea and fever.   EXAM: CT ABDOMEN AND PELVIS WITH CONTRAST  IMPRESSION: 1. Distal ascending colon with associated pericolonic fat stranding and prominent but nonenlarged mesenteric lymph nodes. Findings suggestive of malignancy. Differential diagnosis of infection. Recommend colonoscopy after course of infection treatment to exclude malignancy. 2. Tiny hiatal hernia. 3. Nonobstructive punctate left nephrolithiasis.   02/24/2021 Procedure   Colonoscopy, Dr. Silverio Decamp  Impression: - Rule out malignancy, polypoid lesion in the cecum. Biopsied. - Likely malignant partially obstructing tumor in the ascending colon. Biopsied. Tattooed. - Diverticulosis in the sigmoid colon. - Non-bleeding external and internal hemorrhoids.   02/24/2021 Initial Biopsy   Diagnosis 1. Cecum Biopsy, Mass - FRAGMENTS OF TUBULOVILLOUS ADENOMA. - NO HIGH GRADE DYSPLASIA OR INVASIVE CARCINOMA. 2. Ascending Colon Biopsy, Mass - ADENOCARCINOMA.   03/05/2021 Imaging   EXAM: CT CHEST WITH CONTRAST  IMPRESSION: No evidence of metastatic disease in the chest.   Heterogeneous, enlarged thyroid with multiple nodules measuring up to 2.2 cm. Recommend thyroid US (ref: J Am Coll Radiol. 2015 Feb;12(2): 143-50).   Coronary artery disease.   Aortic Atherosclerosis (ICD10-I70.0).  04/05/2021 Cancer Staging   Staging form: Colon and  Rectum, AJCC 8th Edition - Pathologic stage from 04/05/2021: Stage IIIB (pT3, pN1a, cM0) - Signed by Truitt Merle, MD on 04/23/2021 Total positive nodes: 1 Histologic grading system: 4 grade system Histologic grade (G): G2 Residual tumor (R): R0 - None   04/05/2021 Definitive Surgery   FINAL MICROSCOPIC DIAGNOSIS:   A. COLON, RIGHT, RESECTION:  - Invasive moderately differentiated adenocarcinoma.  - Separate tubulovillous adenoma.  - All surgical margins negative for dysplasia and malignancy.  - One of thirty-six lymph nodes involved by metastatic adenocarcinoma (1/36).  - See Oncology Table.   ADDENDUM:  Mismatch Repair Protein (IHC)  SUMMARY INTERPRETATION: NORMAL    04/23/2021 Initial Diagnosis   Cancer of right colon ([place])      INTERVAL HISTORY:  Vickie Holloway is here for a follow up of colon cancer. She was last seen by me on 10/04/21. She presents to the clinic accompanied by her husband. She reports she has recovered well from chemo but notes social stressors.   All other systems were reviewed with the patient and are negative.  MEDICAL HISTORY:  Past Medical History:  Diagnosis Date   Allergy    Anxiety    Arthritis    Colon cancer ([place])    Depression    Hypertension    Insomnia     SURGICAL HISTORY: Past Surgical History:  Procedure Laterality Date   ABDOMINAL HYSTERECTOMY  03/22/1983   CATARACT EXTRACTION W/ INTRAOCULAR LENS IMPLANT Left    COLONOSCOPY     COSMETIC SURGERY  03/21/1997   face lift   DILATION AND CURETTAGE OF UTERUS     LAPAROSCOPIC RIGHT HEMI COLECTOMY Right 04/05/2021   Procedure: LAPAROSCOPIC RIGHT HEMI COLECTOMY;  Surgeon: Ileana Roup, MD;  Location: [facility];  Service: General;  Laterality: Right;   ORIF WRIST FRACTURE Left 01/01/2013   Procedure: OPEN REDUCTION INTERNAL FIXATION (ORIF)OF LEFT DISTAL RADIUS FRACTURE;  Surgeon: Cammie Sickle., MD;  Location: [place];  Service: Orthopedics;  Laterality: Left;    REFRACTIVE SURGERY     TONSILLECTOMY      I have reviewed the social history and family history with the patient and they are unchanged from previous note.  ALLERGIES:  has No Known Allergies.  MEDICATIONS:  Current Outpatient Medications  Medication Sig Dispense Refill   Ascorbic Acid (VITAMIN C PO) Take 2 tablets by mouth daily.     CALCIUM PO Take by mouth daily.     capecitabine (XELODA) 500 MG tablet Take 3 tabs in morning and 2 tabs in evening after meals, every 10-12 hours. Take for 14 days then off for 7 days. 70 tablet 0   ferrous sulfate 325 (65 FE) MG tablet Take 1 tablet (325 mg total) by mouth 2 (two) times daily with a meal for 14 days. 28 tablet 0   lamoTRIgine (LAMICTAL) 200 MG tablet Take 200 mg by mouth daily.     Magnesium 400 MG TABS Take 400 mg by mouth daily.     Multiple Vitamins-Minerals (ZINC PO) Take 1 tablet by mouth daily.     olmesartan (BENICAR) 20 MG tablet Take 20 mg by mouth daily.     ondansetron (ZOFRAN) 8 MG tablet Take 1 tablet (8 mg total) by mouth every 8 (eight) hours as needed for nausea or vomiting. 20 tablet 3   Pyridoxine HCl (VITAMIN B6 PO) Take by mouth 2 (two) times daily.     triamcinolone ointment (  KENALOG) 0.5 % Apply 1 application. topically 2 (two) times daily. 30 g 0   urea (CARMOL) 10 % cream Apply topically 2 (two) times daily. 71 g 2   VITAMIN D PO Take 2,000 Units by mouth daily.     zolpidem (AMBIEN) 5 MG tablet Take 1/2-1 tablet (2.5-5 mg total) by mouth at bedtime as needed for sleep. 20 tablet 0   No current facility-administered medications for this visit.    PHYSICAL EXAMINATION: ECOG PERFORMANCE STATUS: 0 - Asymptomatic  Vitals:   11/15/21 1355  BP: (!) 153/78  Pulse: 79  Resp: 18  Temp: 98.2 F (36.8 C)  SpO2: 99%   Wt Readings from Last 3 Encounters:  11/15/21 120 lb 12.8 oz (54.8 kg)  10/04/21 120 lb 9.6 oz (54.7 kg)  09/03/21 122 lb 11.2 oz (55.7 kg)     GENERAL:alert, no distress and comfortable SKIN:  skin color, texture, turgor are normal, no rashes or significant lesions EYES: normal, Conjunctiva are pink and non-injected, sclera clear  ABDOMEN:abdomen soft, non-tender and normal bowel sounds Musculoskeletal:no cyanosis of digits and no clubbing  NEURO: alert & oriented x 3 with fluent speech, no focal motor/sensory deficits  LABORATORY DATA:  I have reviewed the data as listed    Latest Ref Rng & Units 11/11/2021   10:28 AM 10/04/2021   10:07 AM 09/03/2021    1:14 PM  CBC  WBC 4.0 - 10.5 K/uL 4.8  4.7  6.8   Hemoglobin 12.0 - 15.0 g/dL 14.2  13.3  12.3   Hematocrit 36.0 - 46.0 % 41.9  38.9  35.2   Platelets 150 - 400 K/uL 232  215  258         Latest Ref Rng & Units 11/11/2021   10:28 AM 10/04/2021   10:07 AM 09/03/2021    1:14 PM  CMP  Glucose 70 - 99 mg/dL 100  112  92   BUN 8 - 23 mg/dL '22  11  25   ' Creatinine 0.44 - 1.00 mg/dL 0.55  0.75  0.77   Sodium 135 - 145 mmol/L 142  140  139   Potassium 3.5 - 5.1 mmol/L 4.6  4.2  4.2   Chloride 98 - 111 mmol/L 107  105  104   CO2 22 - 32 mmol/L 31  32  30   Calcium 8.9 - 10.3 mg/dL 9.8  9.6  9.8   Total Protein 6.5 - 8.1 g/dL 7.1  6.9  6.9   Total Bilirubin 0.3 - 1.2 mg/dL 0.7  1.0  0.8   Alkaline Phos 38 - 126 U/L 81  78  69   AST 15 - 41 U/L '26  21  20   ' ALT 0 - 44 U/L '18  14  12       ' RADIOGRAPHIC STUDIES: I have personally reviewed the radiological images as listed and agreed with the findings in the report. No results found.    Orders Placed This Encounter  Procedures   US Pelvis Complete    Standing Status:   Future    Standing Expiration Date:   11/15/2022    Order Specific Question:   Reason for Exam (SYMPTOM  OR DIAGNOSIS REQUIRED)    Answer:   abnormal CT findings    Order Specific Question:   Preferred imaging location?    Answer:   [facility]   US THYROID    Standing Status:   Future    Standing Expiration Date:  11/16/2022    Order Specific Question:   Reason for Exam (SYMPTOM  OR  DIAGNOSIS REQUIRED)    Answer:   thyroid nodules    Order Specific Question:   Preferred imaging location?    Answer:   [facility]   Ambulatory referral to Psychology    Referral Priority:   Routine    Referral Type:   Psychiatric    Referral Reason:   Specialty Services Required    Requested Specialty:   Psychology    Number of Visits Requested:   1   All questions were answered. The patient knows to call the clinic with any problems, questions or concerns. No barriers to learning was detected. The total time spent in the appointment was 30 minutes.     Truitt Merle, MD 11/15/2021   I, Wilburn Mylar, am acting as scribe for Truitt Merle, MD.   I have reviewed the above documentation for accuracy and completeness, and I agree with the above.

## 2021-11-16 ENCOUNTER — Encounter: Payer: Self-pay | Admitting: Hematology

## 2021-11-17 ENCOUNTER — Telehealth: Payer: Self-pay | Admitting: Hematology

## 2021-11-17 NOTE — Telephone Encounter (Signed)
Scheduled follow-up appointment per 8/28 los. Patient is aware.

## 2021-11-19 ENCOUNTER — Other Ambulatory Visit: Payer: Self-pay | Admitting: Hematology

## 2021-11-19 ENCOUNTER — Ambulatory Visit (HOSPITAL_COMMUNITY)
Admission: RE | Admit: 2021-11-19 | Discharge: 2021-11-19 | Disposition: A | Discharge status: Home / Self Care | Payer: Medicare Other | Source: Ambulatory Visit | Attending: Hematology | Admitting: Hematology

## 2021-11-19 DIAGNOSIS — R1900 Intra-abdominal and pelvic swelling, mass and lump, unspecified site: Secondary | ICD-10-CM

## 2021-11-19 DIAGNOSIS — E042 Nontoxic multinodular goiter: Secondary | ICD-10-CM | POA: Diagnosis present

## 2021-11-25 NOTE — Progress Notes (Signed)
[place]   Telephone:(336) [phone] Fax:(336) [phone]   Clinic Follow up Note   Patient Care Team: [name], MD as PCP - General (Family Medicine) Truitt Merle, MD as Consulting Physician (Oncology) Royston Bake, RN as Oncology Nurse Navigator (Oncology)  Date of Service:  11/26/2021  I connected with Vickie Holloway on 11/26/2021 at  8:40 AM EDT by telephone visit and verified that I am speaking with the correct person using two identifiers.  I discussed the limitations, risks, security and privacy concerns of performing an evaluation and management service by telephone and the availability of in person appointments. I also discussed with the patient that there may be a patient responsible charge related to this service. The patient expressed understanding and agreed to proceed.   Other persons participating in the visit and their role in the encounter:  Her husband   Patient's location:  Home  Provider's location:  my office  CHIEF COMPLAINT: review recent scans, f/u of colon cancer  CURRENT THERAPY:  Surveillance  ASSESSMENT & PLAN:  Vickie Holloway is a 76 y.o. female with   1. Cancer of Holloway Colon, Stage IIIB p(T3, N1aM0), MSI-S 2. Anxiety and depression 3. Prominent left ovarian tissue on CT CAP 11/11/21 4. Nodular enlargement of thyroid on CT CAP 11/11/21  -left ovarian enlargement could not be visualized on pelvic US on 11/19/21 -thyroid US on 11/19/21 showed findings consistent with multinodular goiter. A 1.9 cm nodule in Holloway superior gland meets criteria to consider FNA. -pt agrees with my recommendations of Holloway nodule biopsy and pelvic MRI, order placed today, plan to complete as soon as possible.  I will call her after the results came back.    No problem-specific Assessment & Plan notes found for this encounter.   SUMMARY OF ONCOLOGIC HISTORY: Oncology History Overview Note   Cancer Staging  Cancer of Holloway colon [facility]) Staging form: Colon  and Rectum, AJCC 8th Edition - Pathologic stage from 04/05/2021: Stage IIIB (pT3, pN1a, cM0) - Signed by Truitt Merle, MD on 04/23/2021    Cancer of Holloway colon [facility])  02/08/2021 Imaging   CLINICAL DATA:  Holloway lower quadrant abdominal pain. Nausea and fever.   EXAM: CT ABDOMEN AND PELVIS WITH CONTRAST  IMPRESSION: 1. Distal ascending colon with associated pericolonic fat stranding and prominent but nonenlarged mesenteric lymph nodes. Findings suggestive of malignancy. Differential diagnosis of infection. Recommend colonoscopy after course of infection treatment to exclude malignancy. 2. Tiny hiatal hernia. 3. Nonobstructive punctate left nephrolithiasis.   02/24/2021 Procedure   Colonoscopy, Dr. Silverio Decamp  Impression: - Rule out malignancy, polypoid lesion in the cecum. Biopsied. - Likely malignant partially obstructing tumor in the ascending colon. Biopsied. Tattooed. - Diverticulosis in the sigmoid colon. - Non-bleeding external and internal hemorrhoids.   02/24/2021 Initial Biopsy   Diagnosis 1. Cecum Biopsy, Mass - FRAGMENTS OF TUBULOVILLOUS ADENOMA. - NO HIGH GRADE DYSPLASIA OR INVASIVE CARCINOMA. 2. Ascending Colon Biopsy, Mass - ADENOCARCINOMA.   03/05/2021 Imaging   EXAM: CT CHEST WITH CONTRAST  IMPRESSION: No evidence of metastatic disease in the chest.   Heterogeneous, enlarged thyroid with multiple nodules measuring up to 2.2 cm. Recommend thyroid US (ref: J Am Coll Radiol. 2015 Feb;12(2): 143-50).   Coronary artery disease.   Aortic Atherosclerosis (ICD10-I70.0).   04/05/2021 Cancer Staging   Staging form: Colon and Rectum, AJCC 8th Edition - Pathologic stage from 04/05/2021: Stage IIIB (pT3, pN1a, cM0) - Signed by Truitt Merle, MD on 04/23/2021 Total positive nodes: 1  Histologic grading system: 4 grade system Histologic grade (G): G2 Residual tumor (R): R0 - None   04/05/2021 Definitive Surgery   FINAL MICROSCOPIC DIAGNOSIS:   A. COLON, Holloway, RESECTION:  -  Invasive moderately differentiated adenocarcinoma.  - Separate tubulovillous adenoma.  - All surgical margins negative for dysplasia and malignancy.  - One of thirty-six lymph nodes involved by metastatic adenocarcinoma (1/36).  - See Oncology Table.   ADDENDUM:  Mismatch Repair Protein (IHC)  SUMMARY INTERPRETATION: NORMAL    04/23/2021 Initial Diagnosis   Cancer of Holloway colon ([place])      INTERVAL HISTORY:  Vickie Holloway was contacted for a follow up of recent scans. She was last seen by me on 11/15/21.  She denies any new symptoms since last visit.   All other systems were reviewed with the patient and are negative.  MEDICAL HISTORY:  Past Medical History:  Diagnosis Date   Allergy    Anxiety    Arthritis    Colon cancer ([place])    Depression    Hypertension    Insomnia     SURGICAL HISTORY: Past Surgical History:  Procedure Laterality Date   ABDOMINAL HYSTERECTOMY  03/22/1983   CATARACT EXTRACTION W/ INTRAOCULAR LENS IMPLANT Left    COLONOSCOPY     COSMETIC SURGERY  03/21/1997   face lift   DILATION AND CURETTAGE OF UTERUS     LAPAROSCOPIC Holloway HEMI COLECTOMY Holloway 04/05/2021   Procedure: LAPAROSCOPIC Holloway HEMI COLECTOMY;  Surgeon: Ileana Roup, MD;  [facility];  Service: General;  Laterality: Holloway;   ORIF WRIST FRACTURE Left 01/01/2013   Procedure: OPEN REDUCTION INTERNAL FIXATION (ORIF)OF LEFT DISTAL RADIUS FRACTURE;  Surgeon: Cammie Sickle., MD;  Location: [place];  Service: Orthopedics;  Laterality: Left;   REFRACTIVE SURGERY     TONSILLECTOMY      I have reviewed the social history and family history with the patient and they are unchanged from previous note.  ALLERGIES:  has No Known Allergies.  MEDICATIONS:  Current Outpatient Medications  Medication Sig Dispense Refill   Ascorbic Acid (VITAMIN C PO) Take 2 tablets by mouth daily.     CALCIUM PO Take by mouth daily.     capecitabine (XELODA) 500 MG tablet Take 3  tabs in morning and 2 tabs in evening after meals, every 10-12 hours. Take for 14 days then off for 7 days. 70 tablet 0   ferrous sulfate 325 (65 FE) MG tablet Take 1 tablet (325 mg total) by mouth 2 (two) times daily with a meal for 14 days. 28 tablet 0   lamoTRIgine (LAMICTAL) 200 MG tablet Take 200 mg by mouth daily.     Magnesium 400 MG TABS Take 400 mg by mouth daily.     Multiple Vitamins-Minerals (ZINC PO) Take 1 tablet by mouth daily.     olmesartan (BENICAR) 20 MG tablet Take 20 mg by mouth daily.     ondansetron (ZOFRAN) 8 MG tablet Take 1 tablet (8 mg total) by mouth every 8 (eight) hours as needed for nausea or vomiting. 20 tablet 3   Pyridoxine HCl (VITAMIN B6 PO) Take by mouth 2 (two) times daily.     triamcinolone ointment (KENALOG) 0.5 % Apply 1 application. topically 2 (two) times daily. 30 g 0   urea (CARMOL) 10 % cream Apply topically 2 (two) times daily. 71 g 2   VITAMIN D PO Take 2,000 Units by mouth daily.     zolpidem (AMBIEN)  5 MG tablet Take 1/2-1 tablet (2.5-5 mg total) by mouth at bedtime as needed for sleep. 20 tablet 0   No current facility-administered medications for this visit.    PHYSICAL EXAMINATION: ECOG PERFORMANCE STATUS: 0 - Asymptomatic  There were no vitals filed for this visit. Wt Readings from Last 3 Encounters:  11/15/21 120 lb 12.8 oz (54.8 kg)  10/04/21 120 lb 9.6 oz (54.7 kg)  09/03/21 122 lb 11.2 oz (55.7 kg)     No vitals taken today, Exam not performed today  LABORATORY DATA:  I have reviewed the data as listed    Latest Ref Rng & Units 11/11/2021   10:28 AM 10/04/2021   10:07 AM 09/03/2021    1:14 PM  CBC  WBC 4.0 - 10.5 K/uL 4.8  4.7  6.8   Hemoglobin 12.0 - 15.0 g/dL 14.2  13.3  12.3   Hematocrit 36.0 - 46.0 % 41.9  38.9  35.2   Platelets 150 - 400 K/uL 232  215  258         Latest Ref Rng & Units 11/11/2021   10:28 AM 10/04/2021   10:07 AM 09/03/2021    1:14 PM  CMP  Glucose 70 - 99 mg/dL 100  112  92   BUN 8 - 23 mg/dL  _0 Creatinine 0.44 - 1.00 mg/dL 0.55  0.75  0.77   Sodium 135 - 145 mmol/L 142  140  139   Potassium 3.5 - 5.1 mmol/L 4.6  4.2  4.2   Chloride 98 - 111 mmol/L 107  105  104   CO2 22 - 32 mmol/L 31  32  30   Calcium 8.9 - 10.3 mg/dL 9.8  9.6  9.8   Total Protein 6.5 - 8.1 g/dL 7.1  6.9  6.9   Total Bilirubin 0.3 - 1.2 mg/dL 0.7  1.0  0.8   Alkaline Phos 38 - 126 U/L 81  78  69   AST 15 - 41 U/L _1 ALT 0 - 44 U/L _2 RADIOGRAPHIC STUDIES: I have personally reviewed the radiological images as listed and agreed with the findings in the report. No results found.    Orders Placed This Encounter  Procedures   [place] FNA BX THYROID 1ST LESION AFIRMA    Standing Status:   Future    Standing Expiration Date:   11/27/2022    Order Specific Question:   Reason for Exam (SYMPTOM  OR DIAGNOSIS REQUIRED)    Answer:   rule out malignancy    Order Specific Question:   Preferred location?    Answer:   [facility]   MR Pelvis W Wo Contrast    Standing Status:   Future    Standing Expiration Date:   11/27/2022    Order Specific Question:   If indicated for the ordered procedure, I authorize the administration of contrast media per Radiology protocol    Answer:   Yes    Order Specific Question:   What is the patient's sedation requirement?    Answer:   No Sedation    Order Specific Question:   Does the patient have a pacemaker or implanted devices?    Answer:   No    Order Specific Question:   Preferred imaging location?    Answer:   [facility] (table limit - 550 lbs)  All questions were answered. The patient knows to call the clinic with any problems, questions or concerns. No barriers to learning was detected. The total time spent in the appointment was 15 minutes.     Truitt Merle, MD 11/26/2021   I, Wilburn Mylar, am acting as scribe for Truitt Merle, MD.  I have reviewed the above documentation for accuracy and completeness, and I agree with  the above.

## 2021-11-26 ENCOUNTER — Inpatient Hospital Stay: Payer: Medicare Other | Attending: Hematology | Admitting: Hematology

## 2021-11-26 DIAGNOSIS — E041 Nontoxic single thyroid nodule: Secondary | ICD-10-CM | POA: Diagnosis not present

## 2021-11-26 DIAGNOSIS — C182 Malignant neoplasm of ascending colon: Secondary | ICD-10-CM | POA: Diagnosis not present

## 2021-12-06 ENCOUNTER — Ambulatory Visit (HOSPITAL_COMMUNITY)
Admission: RE | Admit: 2021-12-06 | Discharge: 2021-12-06 | Disposition: A | Discharge status: Home / Self Care | Payer: Medicare Other | Source: Ambulatory Visit | Attending: Hematology | Admitting: Hematology

## 2021-12-06 DIAGNOSIS — C182 Malignant neoplasm of ascending colon: Secondary | ICD-10-CM | POA: Insufficient documentation

## 2021-12-06 MED ORDER — GADOBUTROL 1 MMOL/ML IV SOLN
5.4000 mL | Freq: Once | INTRAVENOUS | Status: AC | PRN
Start: 2021-12-06 — End: 2021-12-06
  Administered 2021-12-06: 5.4 mL via INTRAVENOUS

## 2021-12-07 ENCOUNTER — Encounter: Payer: Self-pay | Admitting: Hematology

## 2021-12-23 ENCOUNTER — Ambulatory Visit (HOSPITAL_COMMUNITY)
Admission: RE | Admit: 2021-12-23 | Discharge: 2021-12-23 | Disposition: A | Discharge status: Home / Self Care | Payer: Medicare Other | Source: Ambulatory Visit | Attending: Hematology | Admitting: Hematology

## 2021-12-23 DIAGNOSIS — E041 Nontoxic single thyroid nodule: Secondary | ICD-10-CM | POA: Insufficient documentation

## 2021-12-23 MED ORDER — LIDOCAINE HCL (PF) 1 % IJ SOLN
INTRAMUSCULAR | Status: AC
  Filled 2021-12-23: qty 30

## 2021-12-29 LAB — CYTOLOGY - NON PAP

## 2021-12-30 ENCOUNTER — Encounter: Payer: Self-pay | Admitting: Hematology

## 2021-12-30 ENCOUNTER — Inpatient Hospital Stay: Payer: Medicare Other | Attending: Hematology | Admitting: Hematology

## 2021-12-30 DIAGNOSIS — C182 Malignant neoplasm of ascending colon: Secondary | ICD-10-CM | POA: Diagnosis not present

## 2021-12-30 DIAGNOSIS — E042 Nontoxic multinodular goiter: Secondary | ICD-10-CM | POA: Diagnosis not present

## 2021-12-30 NOTE — Progress Notes (Signed)
[place]   Telephone:(336) [phone] Fax:(336) [phone]   Clinic Follow up Note   Patient Care Team: [name], MD as PCP - General (Family Medicine) Truitt Merle, MD as Consulting Physician (Oncology)  Date of Service:  12/30/2021  I connected with Vickie Holloway on 12/30/2021 at 12:20 PM EDT by telephone visit and verified that I am speaking with the correct person using two identifiers.  I discussed the limitations, risks, security and privacy concerns of performing an evaluation and management service by telephone and the availability of in person appointments. I also discussed with the patient that there may be a patient responsible charge related to this service. The patient expressed understanding and agreed to proceed.   Other persons participating in the visit and their role in the encounter:  none  Patient's location:  home Provider's location:  my office  CHIEF COMPLAINT: review biopsy results, f/u of colon cancer  CURRENT THERAPY:  Surveillance  ASSESSMENT & PLAN:  Vickie Holloway is a 76 y.o. female with   1. thyroid nodule  -seen on surveillance CT CAP 11/11/21 -thyroid US on 11/19/21 showed multinodular goiter. A 1.9 cm nodule in Holloway superior gland meets criteria for FNA biopsy. -biopsy performed 12/23/21 was benign. I reviewed the results with her today.  2. Cancer of Holloway Colon, Stage IIIB p(T3, N1aM0), MSI-S -presented to ED with RLQ pain on 02/08/21. Colonoscopy on 02/24/21 showed a partially obstructing tumor in ascending colon. Pathology showed adenocarcinoma. Staging chest CT on 03/05/21 was negative. -Holloway hemi-colectomy on 04/05/21 with Dr. Dema Severin showed 4.7 cm invasive moderately differentiated adenocarcinoma. Margins were negative, one lymph node was positive (1/36). -given the positive lymph node, she completed adjuvant Xeloda 05/03/21 - 10/17/21. -surveillance CT CAP 11/11/21 showed: no convincing evidence of metastatic disease; clustered  nodularity in RLL favored infectious/inflammatory. Plan for f/u CT in late 01/2022.  3. Prominent left ovarian tissue -seen on surveillance CT CAP 11/11/21 -pelvis MRI on 12/06/21 was normal/negative.   2. Anxiety and depression -continue meds per PCP -she reports recent stressors involving her children. I previously referred her to Dr. Michail Sermon.     PLAN:  -lab and CT chest last week of Nov 2023, with f/u several days later   No problem-specific Assessment & Plan notes found for this encounter.   SUMMARY OF ONCOLOGIC HISTORY: Oncology History Overview Note   Cancer Staging  Cancer of Holloway colon [facility]) Staging form: Colon and Rectum, AJCC 8th Edition - Pathologic stage from 04/05/2021: Stage IIIB (pT3, pN1a, cM0) - Signed by Truitt Merle, MD on 04/23/2021    Cancer of Holloway colon [facility])  02/08/2021 Imaging   CLINICAL DATA:  Holloway lower quadrant abdominal pain. Nausea and fever.   EXAM: CT ABDOMEN AND PELVIS WITH CONTRAST  IMPRESSION: 1. Distal ascending colon with associated pericolonic fat stranding and prominent but nonenlarged mesenteric lymph nodes. Findings suggestive of malignancy. Differential diagnosis of infection. Recommend colonoscopy after course of infection treatment to exclude malignancy. 2. Tiny hiatal hernia. 3. Nonobstructive punctate left nephrolithiasis.   02/24/2021 Procedure   Colonoscopy, Dr. Silverio Decamp  Impression: - Rule out malignancy, polypoid lesion in the cecum. Biopsied. - Likely malignant partially obstructing tumor in the ascending colon. Biopsied. Tattooed. - Diverticulosis in the sigmoid colon. - Non-bleeding external and internal hemorrhoids.   02/24/2021 Initial Biopsy   Diagnosis 1. Cecum Biopsy, Mass - FRAGMENTS OF TUBULOVILLOUS ADENOMA. - NO HIGH GRADE DYSPLASIA OR INVASIVE CARCINOMA. 2. Ascending Colon Biopsy, Mass - ADENOCARCINOMA.   03/05/2021  Imaging   EXAM: CT CHEST WITH CONTRAST  IMPRESSION: No evidence of metastatic  disease in the chest.   Heterogeneous, enlarged thyroid with multiple nodules measuring up to 2.2 cm. Recommend thyroid US (ref: J Am Coll Radiol. 2015 Feb;12(2): 143-50).   Coronary artery disease.   Aortic Atherosclerosis (ICD10-I70.0).   04/05/2021 Cancer Staging   Staging form: Colon and Rectum, AJCC 8th Edition - Pathologic stage from 04/05/2021: Stage IIIB (pT3, pN1a, cM0) - Signed by Truitt Merle, MD on 04/23/2021 Total positive nodes: 1 Histologic grading system: 4 grade system Histologic grade (G): G2 Residual tumor (R): R0 - None   04/05/2021 Definitive Surgery   FINAL MICROSCOPIC DIAGNOSIS:   A. COLON, Holloway, RESECTION:  - Invasive moderately differentiated adenocarcinoma.  - Separate tubulovillous adenoma.  - All surgical margins negative for dysplasia and malignancy.  - One of thirty-six lymph nodes involved by metastatic adenocarcinoma (1/36).  - See Oncology Table.   ADDENDUM:  Mismatch Repair Protein (IHC)  SUMMARY INTERPRETATION: NORMAL    04/23/2021 Initial Diagnosis   Cancer of Holloway colon ([place])      INTERVAL HISTORY:  Vickie Holloway was contacted for a follow up of recent biopsy. She was last seen by me on 11/26/21. She reports she did well with her biopsy, denies pain.  She tells me about some postoperative changes to her bowel and bladder functions.   All other systems were reviewed with the patient and are negative.  MEDICAL HISTORY:  Past Medical History:  Diagnosis Date   Allergy    Anxiety    Arthritis    Colon cancer ([place])    Depression    Hypertension    Insomnia     SURGICAL HISTORY: Past Surgical History:  Procedure Laterality Date   ABDOMINAL HYSTERECTOMY  03/22/1983   CATARACT EXTRACTION W/ INTRAOCULAR LENS IMPLANT Left    COLONOSCOPY     COSMETIC SURGERY  03/21/1997   face lift   DILATION AND CURETTAGE OF UTERUS     LAPAROSCOPIC Holloway HEMI COLECTOMY Holloway 04/05/2021   Procedure: LAPAROSCOPIC Holloway HEMI COLECTOMY;  Surgeon: Ileana Roup, MD;  Location: [facility];  Service: General;  Laterality: Holloway;   ORIF WRIST FRACTURE Left 01/01/2013   Procedure: OPEN REDUCTION INTERNAL FIXATION (ORIF)OF LEFT DISTAL RADIUS FRACTURE;  Surgeon: Cammie Sickle., MD;  Location: [place];  Service: Orthopedics;  Laterality: Left;   REFRACTIVE SURGERY     TONSILLECTOMY      I have reviewed the social history and family history with the patient and they are unchanged from previous note.  ALLERGIES:  has No Known Allergies.  MEDICATIONS:  Current Outpatient Medications  Medication Sig Dispense Refill   Ascorbic Acid (VITAMIN C) 1000 MG tablet Take 1,000 mg by mouth daily.     Calcium Carb-Cholecalciferol (CALCIUM 500 + D PO) Take 2 tablets by mouth daily.     capecitabine (XELODA) 500 MG tablet Take 3 tabs in morning and 2 tabs in evening after meals, every 10-12 hours. Take for 14 days then off for 7 days. (Patient not taking: Reported on 12/22/2021) 70 tablet 0   lamoTRIgine (LAMICTAL) 200 MG tablet Take 200 mg by mouth daily.     Magnesium 400 MG TABS Take 400 mg by mouth daily.     Melatonin 10 MG TABS Take 10 mg by mouth at bedtime as needed (sleep).     Multiple Vitamins-Minerals (HAIR SKIN AND NAILS FORMULA) TABS Take 3 tablets by mouth daily.  Multiple Vitamins-Minerals (MULTIVITAMIN ADULT) CHEW Chew 1 tablet by mouth daily.     olmesartan (BENICAR) 20 MG tablet Take 20 mg by mouth daily.     pyridOXINE (VITAMIN B6) 100 MG tablet Take 100 mg by mouth daily.     triamcinolone ointment (KENALOG) 0.5 % Apply 1 application. topically 2 (two) times daily. (Patient not taking: Reported on 12/22/2021) 30 g 0   urea (CARMOL) 10 % cream Apply topically 2 (two) times daily. (Patient not taking: Reported on 12/22/2021) 71 g 2   Zinc 50 MG TABS Take 50 mg by mouth daily.     zolpidem (AMBIEN) 5 MG tablet Take 1/2-1 tablet (2.5-5 mg total) by mouth at bedtime as needed for sleep. (Patient not taking: Reported on  12/22/2021) 20 tablet 0   No current facility-administered medications for this visit.    PHYSICAL EXAMINATION: ECOG PERFORMANCE STATUS: 0 - Asymptomatic  There were no vitals filed for this visit. Wt Readings from Last 3 Encounters:  11/15/21 120 lb 12.8 oz (54.8 kg)  10/04/21 120 lb 9.6 oz (54.7 kg)  09/03/21 122 lb 11.2 oz (55.7 kg)     No vitals taken today, Exam not performed today  LABORATORY DATA:  I have reviewed the data as listed    Latest Ref Rng & Units 11/11/2021   10:28 AM 10/04/2021   10:07 AM 09/03/2021    1:14 PM  CBC  WBC 4.0 - 10.5 K/uL 4.8  4.7  6.8   Hemoglobin 12.0 - 15.0 g/dL 14.2  13.3  12.3   Hematocrit 36.0 - 46.0 % 41.9  38.9  35.2   Platelets 150 - 400 K/uL 232  215  258         Latest Ref Rng & Units 11/11/2021   10:28 AM 10/04/2021   10:07 AM 09/03/2021    1:14 PM  CMP  Glucose 70 - 99 mg/dL 100  112  92   BUN 8 - 23 mg/dL _0 Creatinine 0.44 - 1.00 mg/dL 0.55  0.75  0.77   Sodium 135 - 145 mmol/L 142  140  139   Potassium 3.5 - 5.1 mmol/L 4.6  4.2  4.2   Chloride 98 - 111 mmol/L 107  105  104   CO2 22 - 32 mmol/L 31  32  30   Calcium 8.9 - 10.3 mg/dL 9.8  9.6  9.8   Total Protein 6.5 - 8.1 g/dL 7.1  6.9  6.9   Total Bilirubin 0.3 - 1.2 mg/dL 0.7  1.0  0.8   Alkaline Phos 38 - 126 U/L 81  78  69   AST 15 - 41 U/L _1 ALT 0 - 44 U/L _2 RADIOGRAPHIC STUDIES: I have personally reviewed the radiological images as listed and agreed with the findings in the report. No results found.    Orders Placed This Encounter  Procedures   CT Chest Wo Contrast    Standing Status:   Future    Standing Expiration Date:   12/30/2022    Order Specific Question:   Preferred imaging location?    Answer:   [facility]   All questions were answered. The patient knows to call the clinic with any problems, questions or concerns. No barriers to learning was detected. The total time spent in the appointment was 22  minutes.     Krista Blue  Burr Medico, MD 12/30/2021   I, Wilburn Mylar, am acting as scribe for Truitt Merle, MD.   I have reviewed the above documentation for accuracy and completeness, and I agree with the above.

## 2021-12-31 ENCOUNTER — Telehealth: Payer: Self-pay | Admitting: Hematology

## 2021-12-31 NOTE — Telephone Encounter (Signed)
Spoke with patient confirming upcoming appointments  

## 2022-02-11 ENCOUNTER — Ambulatory Visit (HOSPITAL_COMMUNITY): Admission: RE | Admit: 2022-02-11 | Payer: Medicare Other | Source: Ambulatory Visit

## 2022-02-15 NOTE — Progress Notes (Unsigned)
[place]   Telephone:(336) [phone] Fax:(336) [phone]   Clinic Follow up Note   Patient Care Team: [name], MD as PCP - General (Family Medicine) Truitt Merle, MD as Consulting Physician (Oncology)  Date of Service:  02/16/2022  CHIEF COMPLAINT: f/u of colon cancer   CURRENT THERAPY: Surveillance   ASSESSMENT:  Vickie Holloway is a 76 y.o. female with    1. thyroid nodule  -seen on surveillance CT CAP 11/11/21 -thyroid US on 11/19/21 showed multinodular goiter. A 1.9 cm nodule in right superior gland meets criteria for FNA biopsy. -biopsy performed 12/23/21 was benign. I reviewed the results with her today.   2. Cancer of Right Colon, Stage IIIB p(T3, N1aM0), MSI-S -presented to ED with RLQ pain on 02/08/21. Colonoscopy on 02/24/21 showed a partially obstructing tumor in ascending colon. Pathology showed adenocarcinoma. Staging chest CT on 03/05/21 was negative. -right hemi-colectomy on 04/05/21 with Dr. Dema Severin showed 4.7 cm invasive moderately differentiated adenocarcinoma. Margins were negative, one lymph node was positive (1/36). -given the positive lymph node, she completed adjuvant Xeloda 05/03/21 - 10/17/21. -surveillance CT CAP 11/11/21 showed: no convincing evidence of metastatic disease; clustered nodularity in RLL favored infectious/inflammatory. Plan for f/u CT in late 01/2022. -Repeated CT chest today showed resolved infiltrates in the right lung, no evidence of recurrence. I personally reviewed her scan images today   3. Prominent left ovarian tissue -seen on surveillance CT CAP 11/11/21 -pelvis MRI on 12/06/21 was normal/negative.   2. Anxiety and depression -continue meds per PCP -she reports recent stressors involving her children. I previously referred her to Dr. Michail Sermon.      PLAN: -Discuss CT Scans findings, NED -Discuss nutrients -Lab and f/u in 3 months      SUMMARY OF ONCOLOGIC HISTORY: Oncology History Overview Note   Cancer  Staging  Cancer of right colon [facility]) Staging form: Colon and Rectum, AJCC 8th Edition - Pathologic stage from 04/05/2021: Stage IIIB (pT3, pN1a, cM0) - Signed by Truitt Merle, MD on 04/23/2021    Cancer of right colon [facility])  02/08/2021 Imaging   CLINICAL DATA:  Right lower quadrant abdominal pain. Nausea and fever.   EXAM: CT ABDOMEN AND PELVIS WITH CONTRAST  IMPRESSION: 1. Distal ascending colon with associated pericolonic fat stranding and prominent but nonenlarged mesenteric lymph nodes. Findings suggestive of malignancy. Differential diagnosis of infection. Recommend colonoscopy after course of infection treatment to exclude malignancy. 2. Tiny hiatal hernia. 3. Nonobstructive punctate left nephrolithiasis.   02/24/2021 Procedure   Colonoscopy, Dr. Silverio Decamp  Impression: - Rule out malignancy, polypoid lesion in the cecum. Biopsied. - Likely malignant partially obstructing tumor in the ascending colon. Biopsied. Tattooed. - Diverticulosis in the sigmoid colon. - Non-bleeding external and internal hemorrhoids.   02/24/2021 Initial Biopsy   Diagnosis 1. Cecum Biopsy, Mass - FRAGMENTS OF TUBULOVILLOUS ADENOMA. - NO HIGH GRADE DYSPLASIA OR INVASIVE CARCINOMA. 2. Ascending Colon Biopsy, Mass - ADENOCARCINOMA.   03/05/2021 Imaging   EXAM: CT CHEST WITH CONTRAST  IMPRESSION: No evidence of metastatic disease in the chest.   Heterogeneous, enlarged thyroid with multiple nodules measuring up to 2.2 cm. Recommend thyroid US (ref: J Am Coll Radiol. 2015 Feb;12(2): 143-50).   Coronary artery disease.   Aortic Atherosclerosis (ICD10-I70.0).   04/05/2021 Cancer Staging   Staging form: Colon and Rectum, AJCC 8th Edition - Pathologic stage from 04/05/2021: Stage IIIB (pT3, pN1a, cM0) - Signed by Truitt Merle, MD on 04/23/2021 Total positive nodes: 1 Histologic grading system: 4 grade system  Histologic grade (G): G2 Residual tumor (R): R0 - None   04/05/2021 Definitive Surgery   FINAL  MICROSCOPIC DIAGNOSIS:   A. COLON, RIGHT, RESECTION:  - Invasive moderately differentiated adenocarcinoma.  - Separate tubulovillous adenoma.  - All surgical margins negative for dysplasia and malignancy.  - One of thirty-six lymph nodes involved by metastatic adenocarcinoma (1/36).  - See Oncology Table.   ADDENDUM:  Mismatch Repair Protein ([facility]HC)  SUMMARY INTERPRETATION: NORMAL    04/23/2021 Initial Diagnosis   Cancer of right colon [facility])      INTERVAL HISTORY:  Vickie Holloway is here for a follow up of colon cancer  She was last seen by me on  12/30/2021 She presents to the clinic alone. Pt states she had a virus Thanksgiving night. Fever and Diarrhea. Pt States she is feeling well.   All other systems were reviewed with the patient and are negative.  MEDICAL HISTORY:  Past Medical History:  Diagnosis Date   Allergy    Anxiety    Arthritis    Colon cancer ([place])    Depression    Hypertension    Insomnia     SURGICAL HISTORY: Past Surgical History:  Procedure Laterality Date   ABDOMINAL HYSTERECTOMY  03/22/1983   CATARACT EXTRACTION W/ INTRAOCULAR LENS IMPLANT Left    COLONOSCOPY     COSMETIC SURGERY  03/21/1997   face lift   DILATION AND CURETTAGE OF UTERUS     LAPAROSCOPIC RIGHT HEMI COLECTOMY Right 04/05/2021   Procedure: LAPAROSCOPIC RIGHT HEMI COLECTOMY;  Surgeon: Ileana Roup, MD;  Location: [facility];  Service: General;  Laterality: Right;   ORIF WRIST FRACTURE Left 01/01/2013   Procedure: OPEN REDUCTION INTERNAL FIXATION (ORIF)OF LEFT DISTAL RADIUS FRACTURE;  Surgeon: Cammie Sickle., MD;  Location: [place];  Service: Orthopedics;  Laterality: Left;   REFRACTIVE SURGERY     TONSILLECTOMY      I have reviewed the social history and family history with the patient and they are unchanged from previous note.  ALLERGIES:  has No Known Allergies.  MEDICATIONS:  Current Outpatient Medications  Medication Sig Dispense Refill    Ascorbic Acid (VITAMIN C) 1000 MG tablet Take 1,000 mg by mouth daily.     Calcium Carb-Cholecalciferol (CALCIUM 500 + D PO) Take 2 tablets by mouth daily.     capecitabine (XELODA) 500 MG tablet Take 3 tabs in morning and 2 tabs in evening after meals, every 10-12 hours. Take for 14 days then off for 7 days. (Patient not taking: Reported on 12/22/2021) 70 tablet 0   lamoTRIgine (LAMICTAL) 200 MG tablet Take 200 mg by mouth daily.     Magnesium 400 MG TABS Take 400 mg by mouth daily.     Melatonin 10 MG TABS Take 10 mg by mouth at bedtime as needed (sleep).     Multiple Vitamins-Minerals (HAIR SKIN AND NAILS FORMULA) TABS Take 3 tablets by mouth daily.     Multiple Vitamins-Minerals (MULTIVITAMIN ADULT) CHEW Chew 1 tablet by mouth daily.     olmesartan (BENICAR) 20 MG tablet Take 20 mg by mouth daily.     pyridOXINE (VITAMIN B6) 100 MG tablet Take 100 mg by mouth daily.     triamcinolone ointment (KENALOG) 0.5 % Apply 1 application. topically 2 (two) times daily. (Patient not taking: Reported on 12/22/2021) 30 g 0   urea (CARMOL) 10 % cream Apply topically 2 (two) times daily. (Patient not taking: Reported on 12/22/2021) 71 g 2  Zinc 50 MG TABS Take 50 mg by mouth daily.     zolpidem (AMBIEN) 5 MG tablet Take 1/2-1 tablet (2.5-5 mg total) by mouth at bedtime as needed for sleep. (Patient not taking: Reported on 12/22/2021) 20 tablet 0   No current facility-administered medications for this visit.    PHYSICAL EXAMINATION: ECOG PERFORMANCE STATUS: 0 - Asymptomatic  Vitals:   02/16/22 1143  BP: (!) 153/73  Pulse: 76  Resp: 20  Temp: 97.8 F (36.6 C)  SpO2: 100%   Wt Readings from Last 3 Encounters:  02/16/22 123 lb 1.6 oz (55.8 kg)  11/15/21 120 lb 12.8 oz (54.8 kg)  10/04/21 120 lb 9.6 oz (54.7 kg)     GENERAL:alert, no distress and comfortable SKIN: skin color normal, no rashes or significant lesions EYES: normal, Conjunctiva are pink and non-injected, sclera clear  NEURO: alert &  oriented x 3 with fluent speech LABORATORY DATA:  I have reviewed the data as listed    Latest Ref Rng & Units 02/16/2022   10:59 AM 11/11/2021   10:28 AM 10/04/2021   10:07 AM  CBC  WBC 4.0 - 10.5 K/uL 4.2  4.8  4.7   Hemoglobin 12.0 - 15.0 g/dL 15.2  14.2  13.3   Hematocrit 36.0 - 46.0 % 44.9  41.9  38.9   Platelets 150 - 400 K/uL 280  232  215         Latest Ref Rng & Units 02/16/2022   10:59 AM 11/11/2021   10:28 AM 10/04/2021   10:07 AM  CMP  Glucose 70 - 99 mg/dL 92  100  112   BUN 8 - 23 mg/dL _0 Creatinine 0.44 - 1.00 mg/dL 0.75  0.55  0.75   Sodium 135 - 145 mmol/L 142  142  140   Potassium 3.5 - 5.1 mmol/L 4.3  4.6  4.2   Chloride 98 - 111 mmol/L 106  107  105   CO2 22 - 32 mmol/L 31  31  32   Calcium 8.9 - 10.3 mg/dL 10.1  9.8  9.6   Total Protein 6.5 - 8.1 g/dL 7.6  7.1  6.9   Total Bilirubin 0.3 - 1.2 mg/dL 0.5  0.7  1.0   Alkaline Phos 38 - 126 U/L 91  81  78   AST 15 - 41 U/L 32  26  21   ALT 0 - 44 U/L _1 RADIOGRAPHIC STUDIES: I have personally reviewed the radiological images as listed and agreed with the findings in the report. CT Chest Wo Contrast  Result Date: 02/16/2022 CLINICAL DATA:  Lung nodule.  Colon cancer.  * Tracking Code: BO * EXAM: CT CHEST WITHOUT CONTRAST TECHNIQUE: Multidetector CT imaging of the chest was performed following the standard protocol without IV contrast. RADIATION DOSE REDUCTION: This exam was performed according to the departmental dose-optimization program which includes automated exposure control, adjustment of the mA and/or kV according to patient size and/or use of iterative reconstruction technique. COMPARISON:  11/11/2021 and 03/05/2021. FINDINGS: Cardiovascular: Atherosclerotic calcification of the aorta, aortic valve and coronary arteries. Heart size normal. No pericardial effusion. Mediastinum/Nodes: Bilateral low-attenuation thyroid nodules measure up to 2.9 cm (ultrasound thyroid 11/19/2021 and  biopsy 12/23/2021). No follow-up recommended unless clinically indicated. (Ref: J Am Coll Radiol. 2015 Feb;12(2): 143-50).No pathologically enlarged mediastinal or axillary lymph nodes. Hilar regions are difficult to definitively evaluate without IV contrast.  Esophagus is grossly unremarkable. Lungs/Pleura: Scattered pulmonary parenchymal scarring. Calcified granulomas. Minimal clustered peribronchovascular nodularity in the anterolateral right lower lobe (7/98), postinfectious in etiology. No pleural fluid. Mild cylindrical bronchiectasis. Airway is otherwise unremarkable. Upper Abdomen: Visualized portions of the liver, adrenal glands and right kidney are unremarkable. Stones in the left kidney. Visualized portions of the spleen, pancreas, stomach and bowel are grossly unremarkable. No upper abdominal adenopathy. Musculoskeletal: Degenerative changes in the spine. Degenerative changes in the shoulders. Chronic asymmetry of the right sternoclavicular joint and right anterior chest wall. No worrisome lytic or sclerotic lesions. IMPRESSION: 1. No evidence of metastatic disease. 2. Mild cylindrical bronchiectasis. 3. Left renal stones. 4. Aortic atherosclerosis (ICD10-I70.0). Coronary artery calcification. Electronically Signed   By: Lorin Picket M.D.   On: 02/16/2022 11:05      No orders of the defined types were placed in this encounter.  All questions were answered. The patient knows to call the clinic with any problems, questions or concerns. No barriers to learning was detected. The total time spent in the appointment was 30 minutes.     Truitt Merle, MD 02/16/2022   Felicity Coyer, CMA, am acting as scribe for Truitt Merle, MD.   I have reviewed the above documentation for accuracy and completeness, and I agree with the above.

## 2022-02-16 ENCOUNTER — Encounter: Payer: Self-pay | Admitting: Hematology

## 2022-02-16 ENCOUNTER — Ambulatory Visit (HOSPITAL_COMMUNITY)
Admission: RE | Admit: 2022-02-16 | Discharge: 2022-02-16 | Disposition: A | Discharge status: Home / Self Care | Payer: Medicare Other | Source: Ambulatory Visit | Attending: Hematology | Admitting: Hematology

## 2022-02-16 ENCOUNTER — Other Ambulatory Visit: Payer: Self-pay

## 2022-02-16 ENCOUNTER — Inpatient Hospital Stay: Payer: Medicare Other | Attending: Hematology

## 2022-02-16 ENCOUNTER — Inpatient Hospital Stay (HOSPITAL_BASED_OUTPATIENT_CLINIC_OR_DEPARTMENT_OTHER): Payer: Medicare Other | Admitting: Hematology

## 2022-02-16 VITALS — BP 153/73 | HR 76 | Temp 97.8°F | Resp 20 | Ht 65.0 in | Wt 123.1 lb

## 2022-02-16 DIAGNOSIS — C182 Malignant neoplasm of ascending colon: Secondary | ICD-10-CM | POA: Insufficient documentation

## 2022-02-16 DIAGNOSIS — Z90710 Acquired absence of both cervix and uterus: Secondary | ICD-10-CM | POA: Diagnosis not present

## 2022-02-16 DIAGNOSIS — F418 Other specified anxiety disorders: Secondary | ICD-10-CM | POA: Diagnosis not present

## 2022-02-16 DIAGNOSIS — C779 Secondary and unspecified malignant neoplasm of lymph node, unspecified: Secondary | ICD-10-CM | POA: Insufficient documentation

## 2022-02-16 DIAGNOSIS — E042 Nontoxic multinodular goiter: Secondary | ICD-10-CM | POA: Insufficient documentation

## 2022-02-16 DIAGNOSIS — I10 Essential (primary) hypertension: Secondary | ICD-10-CM | POA: Insufficient documentation

## 2022-02-16 LAB — CBC WITH DIFFERENTIAL/PLATELET
Abs Immature Granulocytes: 0.01 10*3/uL (ref 0.00–0.07)
Basophils Absolute: 0 10*3/uL (ref 0.0–0.1)
Basophils Relative: 1 %
Eosinophils Absolute: 0 10*3/uL (ref 0.0–0.5)
Eosinophils Relative: 1 %
HCT: 44.9 % (ref 36.0–46.0)
Hemoglobin: 15.2 g/dL — ABNORMAL HIGH (ref 12.0–15.0)
Immature Granulocytes: 0 %
Lymphocytes Relative: 31 %
Lymphs Abs: 1.3 10*3/uL (ref 0.7–4.0)
MCH: 32.5 pg (ref 26.0–34.0)
MCHC: 33.9 g/dL (ref 30.0–36.0)
MCV: 95.9 fL (ref 80.0–100.0)
Monocytes Absolute: 0.3 10*3/uL (ref 0.1–1.0)
Monocytes Relative: 6 %
Neutro Abs: 2.6 10*3/uL (ref 1.7–7.7)
Neutrophils Relative %: 61 %
Platelets: 280 10*3/uL (ref 150–400)
RBC: 4.68 MIL/uL (ref 3.87–5.11)
RDW: 12 % (ref 11.5–15.5)
WBC: 4.2 10*3/uL (ref 4.0–10.5)
nRBC: 0 % (ref 0.0–0.2)

## 2022-02-16 LAB — COMPREHENSIVE METABOLIC PANEL
ALT: 23 U/L (ref 0–44)
AST: 32 U/L (ref 15–41)
Albumin: 4.7 g/dL (ref 3.5–5.0)
Alkaline Phosphatase: 91 U/L (ref 38–126)
Anion gap: 5 (ref 5–15)
BUN: 13 mg/dL (ref 8–23)
CO2: 31 mmol/L (ref 22–32)
Calcium: 10.1 mg/dL (ref 8.9–10.3)
Chloride: 106 mmol/L (ref 98–111)
Creatinine, Ser: 0.75 mg/dL (ref 0.44–1.00)
GFR, Estimated: >60 mL/min (ref >60)
Glucose, Bld: 92 mg/dL (ref 70–99)
Potassium: 4.3 mmol/L (ref 3.5–5.1)
Sodium: 142 mmol/L (ref 135–145)
Total Bilirubin: 0.5 mg/dL (ref 0.3–1.2)
Total Protein: 7.6 g/dL (ref 6.5–8.1)

## 2022-02-16 LAB — CEA (IN HOUSE-CHCC): CEA (CHCC-In House): 2.59 ng/mL (ref 0.00–5.00)

## 2022-02-16 LAB — FERRITIN: Ferritin: 31 ng/mL (ref 11–307)

## 2022-02-17 ENCOUNTER — Telehealth: Payer: Self-pay | Admitting: Hematology

## 2022-02-17 NOTE — Telephone Encounter (Signed)
Called patient to notify of upcoming appointment. Patient notified.  

## 2022-04-04 ENCOUNTER — Telehealth: Payer: Self-pay

## 2022-04-04 NOTE — Telephone Encounter (Signed)
Patient is ready to schedule her colonoscopy. Age 77 years. History colon cancer. Can we schedule her as a direct?

## 2022-04-05 ENCOUNTER — Encounter: Payer: Self-pay | Admitting: Gastroenterology

## 2022-04-05 NOTE — Telephone Encounter (Signed)
Yes, please schedule direct colonoscopy with previsit next available appointment.  Thank you

## 2022-04-05 NOTE — Telephone Encounter (Signed)
Called patient to schedule left voicemail. 

## 2022-04-11 ENCOUNTER — Other Ambulatory Visit: Payer: Self-pay

## 2022-04-11 ENCOUNTER — Ambulatory Visit (AMBULATORY_SURGERY_CENTER): Payer: Self-pay

## 2022-04-11 VITALS — Ht 65.0 in | Wt 122.0 lb

## 2022-04-11 DIAGNOSIS — Z85038 Personal history of other malignant neoplasm of large intestine: Secondary | ICD-10-CM

## 2022-04-11 MED ORDER — NA SULFATE-K SULFATE-MG SULF 17.5-3.13-1.6 GM/177ML PO SOLN: 1.0000 | Freq: Once | ORAL | 0 refills | Status: AC

## 2022-04-11 NOTE — Progress Notes (Signed)
Denies allergies to eggs or soy products. Denies complication of anesthesia or sedation. Denies use of weight loss medication. Denies use of O2.   Emmi instructions given for colonoscopy. 

## 2022-04-12 NOTE — Telephone Encounter (Signed)
PV was completed on 04/11/22 and colonoscopy is scheduled for Tuesday, 05/03/22 at 4 pm.

## 2022-05-01 ENCOUNTER — Encounter: Payer: Self-pay | Admitting: Certified Registered Nurse Anesthetist

## 2022-05-03 ENCOUNTER — Encounter: Payer: Self-pay | Admitting: Gastroenterology

## 2022-05-03 ENCOUNTER — Ambulatory Visit (AMBULATORY_SURGERY_CENTER): Payer: Medicare Other | Admitting: Gastroenterology

## 2022-05-03 VITALS — BP 131/79 | HR 69 | Temp 98.2°F | Resp 17 | Ht 65.0 in | Wt 122.0 lb

## 2022-05-03 DIAGNOSIS — Z08 Encounter for follow-up examination after completed treatment for malignant neoplasm: Secondary | ICD-10-CM | POA: Diagnosis not present

## 2022-05-03 DIAGNOSIS — K635 Polyp of colon: Secondary | ICD-10-CM | POA: Diagnosis not present

## 2022-05-03 DIAGNOSIS — Z85038 Personal history of other malignant neoplasm of large intestine: Secondary | ICD-10-CM

## 2022-05-03 DIAGNOSIS — D123 Benign neoplasm of transverse colon: Secondary | ICD-10-CM | POA: Diagnosis not present

## 2022-05-03 MED ORDER — SODIUM CHLORIDE 0.9 % IV SOLN: 500.0000 mL | INTRAVENOUS | Status: DC

## 2022-05-03 NOTE — Patient Instructions (Signed)
Please read handouts provided. Continue present medications. Await pathology results. No repeat colonoscopy due to age.   YOU HAD AN ENDOSCOPIC PROCEDURE TODAY AT [facility]:   Refer to the procedure report that was given to you for any specific questions about what was found during the examination.  If the procedure report does not answer your questions, please call your gastroenterologist to clarify.  If you requested that your care partner not be given the details of your procedure findings, then the procedure report has been included in a sealed envelope for you to review at your convenience later.  YOU SHOULD EXPECT: Some feelings of bloating in the abdomen. Passage of more gas than usual.  Walking can help get rid of the air that was put into your GI tract during the procedure and reduce the bloating. If you had a lower endoscopy (such as a colonoscopy or flexible sigmoidoscopy) you may notice spotting of blood in your stool or on the toilet paper. If you underwent a bowel prep for your procedure, you may not have a normal bowel movement for a few days.  Please Note:  You might notice some irritation and congestion in your nose or some drainage.  This is from the oxygen used during your procedure.  There is no need for concern and it should clear up in a day or so.  SYMPTOMS TO REPORT IMMEDIATELY:  Following lower endoscopy (colonoscopy or flexible sigmoidoscopy):  Excessive amounts of blood in the stool  Significant tenderness or worsening of abdominal pains  Swelling of the abdomen that is new, acute  Fever of 100F or higher   For urgent or emergent issues, a gastroenterologist can be reached at any hour by calling [phone]. Do not use MyChart messaging for urgent concerns.    DIET:  We do recommend a small meal at first, but then you may proceed to your regular diet.  Drink plenty of fluids but you should avoid alcoholic beverages for 24 hours.  ACTIVITY:   You should plan to take it easy for the rest of today and you should NOT DRIVE or use heavy machinery until tomorrow (because of the sedation medicines used during the test).    FOLLOW UP: Our staff will call the number listed on your records the next business day following your procedure.  We will call around 7:15- 8:00 am to check on you and address any questions or concerns that you may have regarding the information given to you following your procedure. If we do not reach you, we will leave a message.     If any biopsies were taken you will be contacted by phone or by letter within the next 1-3 weeks.  Please call us at [phone] if you have not heard about the biopsies in 3 weeks.    SIGNATURES/CONFIDENTIALITY: You and/or your care partner have signed paperwork which will be entered into your electronic medical record.  These signatures attest to the fact that that the information above on your After Visit Summary has been reviewed and is understood.  Full responsibility of the confidentiality of this discharge information lies with you and/or your care-partner.

## 2022-05-03 NOTE — Progress Notes (Signed)
[facility] History and Physical   Primary Care Physician:  [name], MD   Reason for Procedure:  History of colon cancer  Plan:    Surveillance colonoscopy with possible interventions as needed     HPI: Vickie Holloway is a very pleasant 77 y.o. female here for surveillance colonoscopy.   The risks and benefits as well as alternatives of endoscopic procedure(s) have been discussed and reviewed. All questions answered. The patient agrees to proceed.    Past Medical History:  Diagnosis Date   Allergy    Anemia    Anxiety    Arthritis    Cataract    Colon cancer ([place])    Depression    Hyperlipidemia    Hypertension    Insomnia     Past Surgical History:  Procedure Laterality Date   ABDOMINAL HYSTERECTOMY  03/22/1983   CATARACT EXTRACTION W/ INTRAOCULAR LENS IMPLANT Left    COLONOSCOPY     COSMETIC SURGERY  03/21/1997   face lift   DILATION AND CURETTAGE OF UTERUS     LAPAROSCOPIC RIGHT HEMI COLECTOMY Right 04/05/2021   Procedure: LAPAROSCOPIC RIGHT HEMI COLECTOMY;  Surgeon: Ileana Roup, MD;  Location: [facility];  Service: General;  Laterality: Right;   ORIF WRIST FRACTURE Left 01/01/2013   Procedure: OPEN REDUCTION INTERNAL FIXATION (ORIF)OF LEFT DISTAL RADIUS FRACTURE;  Surgeon: Cammie Sickle., MD;  Location: [facility];  Service: Orthopedics;  Laterality: Left;   REFRACTIVE SURGERY     TONSILLECTOMY      Prior to Admission medications   Medication Sig Start Date End Date Taking? Authorizing Provider  Ascorbic Acid (VITAMIN C) 1000 MG tablet Take 1,000 mg by mouth daily.    [provider]  Calcium Carb-Cholecalciferol (CALCIUM 500 + D PO) Take 2 tablets by mouth daily.    [provider]  capecitabine (XELODA) 500 MG tablet Take 3 tabs in morning and 2 tabs in evening after meals, every 10-12 hours. Take for 14 days then off for 7 days. Patient not taking: Reported on 12/22/2021 09/03/21   Truitt Merle, MD   lamoTRIgine (LAMICTAL) 200 MG tablet Take 200 mg by mouth daily. 01/16/21   [provider]  Magnesium 400 MG TABS Take 400 mg by mouth daily.    [provider]  Melatonin 10 MG TABS Take 10 mg by mouth at bedtime as needed (sleep). Patient not taking: Reported on 04/11/2022    [provider]  Multiple Vitamins-Minerals (HAIR SKIN AND NAILS FORMULA) TABS Take 3 tablets by mouth daily.    [provider]  Multiple Vitamins-Minerals (MULTIVITAMIN ADULT) CHEW Chew 1 tablet by mouth daily. Patient not taking: Reported on 04/11/2022    [provider]  olmesartan (BENICAR) 20 MG tablet Take 20 mg by mouth daily. 01/16/21   [provider]  OVER THE COUNTER MEDICATION Vitamin D 3 one capsule daily.    [provider]  pyridOXINE (VITAMIN B6) 100 MG tablet Take 100 mg by mouth daily.    [provider]  spironolactone (ALDACTONE) 25 MG tablet Take 25 mg by mouth daily.    [provider]  triamcinolone ointment (KENALOG) 0.5 % Apply 1 application. topically 2 (two) times daily. Patient not taking: Reported on 12/22/2021 06/08/21   Truitt Merle, MD  Zinc 50 MG TABS Take 50 mg by mouth daily.    [provider]  zolpidem (AMBIEN) 5 MG tablet Take 1/2-1 tablet (2.5-5 mg total) by mouth at  bedtime as needed for sleep. Patient not taking: Reported on 12/22/2021 10/04/21   Truitt Merle, MD    Current Outpatient Medications  Medication Sig Dispense Refill   Ascorbic Acid (VITAMIN C) 1000 MG tablet Take 1,000 mg by mouth daily.     Calcium Carb-Cholecalciferol (CALCIUM 500 + D PO) Take 2 tablets by mouth daily.     capecitabine (XELODA) 500 MG tablet Take 3 tabs in morning and 2 tabs in evening after meals, every 10-12 hours. Take for 14 days then off for 7 days. (Patient not taking: Reported on 12/22/2021) 70 tablet 0   lamoTRIgine (LAMICTAL) 200 MG tablet Take 200 mg by mouth daily.     Magnesium 400 MG TABS Take 400 mg by  mouth daily.     Melatonin 10 MG TABS Take 10 mg by mouth at bedtime as needed (sleep). (Patient not taking: Reported on 04/11/2022)     Multiple Vitamins-Minerals (HAIR SKIN AND NAILS FORMULA) TABS Take 3 tablets by mouth daily.     Multiple Vitamins-Minerals (MULTIVITAMIN ADULT) CHEW Chew 1 tablet by mouth daily. (Patient not taking: Reported on 04/11/2022)     olmesartan (BENICAR) 20 MG tablet Take 20 mg by mouth daily.     OVER THE COUNTER MEDICATION Vitamin D 3 one capsule daily.     pyridOXINE (VITAMIN B6) 100 MG tablet Take 100 mg by mouth daily.     spironolactone (ALDACTONE) 25 MG tablet Take 25 mg by mouth daily.     triamcinolone ointment (KENALOG) 0.5 % Apply 1 application. topically 2 (two) times daily. (Patient not taking: Reported on 12/22/2021) 30 g 0   Zinc 50 MG TABS Take 50 mg by mouth daily.     zolpidem (AMBIEN) 5 MG tablet Take 1/2-1 tablet (2.5-5 mg total) by mouth at bedtime as needed for sleep. (Patient not taking: Reported on 12/22/2021) 20 tablet 0   Current Facility-Administered Medications  Medication Dose Route Frequency Provider Last Rate Last Admin   0.9 %  sodium chloride infusion  500 mL Intravenous Continuous Carrington Mullenax, Venia Minks, MD        Allergies as of 05/03/2022   (No Known Allergies)    Family History  Problem Relation Age of Onset   Stroke Mother    Heart disease Mother    Pulmonary fibrosis Father    Cancer Maternal Grandmother        lung cancer   Colon cancer Neg Hx    Stomach cancer Neg Hx    Esophageal cancer Neg Hx    Pancreatic cancer Neg Hx    Rectal cancer Neg Hx     Social History   Socioeconomic History   Marital status: Married    Spouse name: Not on file   Number of children: 3   Years of education: Not on file   Highest education level: Not on file  Occupational History   Not on file  Tobacco Use   Smoking status: Former    Packs/day: 0.25    Years: 20.00    Total pack years: 5.00    Types: Cigarettes    Quit date:  01/01/1979    Years since quitting: 43.3   Smokeless tobacco: Former  Scientific laboratory technician Use: Former  Substance and Sexual Activity   Alcohol use: Yes    Comment: she used to drink alcoho (wine)l 3-4 days a week, stopped in 2020   Drug use: No   Sexual activity: Not on file  Other Topics Concern  Not on file  Social History Narrative   Not on file   Social Determinants of Health   Financial Resource Strain: Not on file  Food Insecurity: Not on file  Transportation Needs: Not on file  Physical Activity: Not on file  Stress: Not on file  Social Connections: Not on file  Intimate Partner Violence: Not on file    Review of Systems:  All other review of systems negative except as mentioned in the HPI.  Physical Exam: Vital signs in last 24 hours: Blood Pressure (Abnormal) 141/82   Pulse 76   Temperature 98.2 F (36.8 C)   Height 5' 5"$  (1.651 m)   Weight 122 lb (55.3 kg)   Oxygen Saturation 98%   Body Mass Index 20.30 kg/m  General:   Alert, NAD Lungs:  Clear .   Heart:  Regular rate and rhythm Abdomen:  Soft, nontender and nondistended. Neuro/Psych:  Alert and cooperative. Normal mood and affect. A and O x 3  Reviewed labs, radiology imaging, old records and pertinent past GI work up  Patient is appropriate for planned procedure(s) and anesthesia in an ambulatory setting   K. Denzil Magnuson , MD [phone]

## 2022-05-03 NOTE — Progress Notes (Signed)
Called to room to assist during endoscopic procedure.  Patient ID and intended procedure confirmed with present staff. Received instructions for my participation in the procedure from the performing physician.  

## 2022-05-03 NOTE — Progress Notes (Signed)
Patient reports no changes to health or medications since pre visit.

## 2022-05-03 NOTE — Op Note (Signed)
[facility] Patient Name: Vickie Holloway Procedure Date: 05/03/2022 3:55 PM MRN: [id number] Endoscopist: Mauri Pole , MD, [id number] Age: 77 Referring MD:  Date of Birth: [birth year] Gender: Female Account #: [id number] Procedure:                Colonoscopy Indications:              High risk colon cancer surveillance: Personal                            history of colon cancer Medicines:                Monitored Anesthesia Care Procedure:                Pre-Anesthesia Assessment:                           - Prior to the procedure, a History and Physical                            was performed, and patient medications and                            allergies were reviewed. The patient's tolerance of                            previous anesthesia was also reviewed. The risks                            and benefits of the procedure and the sedation                            options and risks were discussed with the patient.                            All questions were answered, and informed consent                            was obtained. Prior Anticoagulants: The patient has                            taken no anticoagulant or antiplatelet agents. ASA                            Grade Assessment: II - A patient with mild systemic                            disease. After reviewing the risks and benefits,                            the patient was deemed in satisfactory condition to                            undergo the procedure.  After obtaining informed consent, the colonoscope                            was passed under direct vision. Throughout the                            procedure, the patient's blood pressure, pulse, and                            oxygen saturations were monitored continuously. The                            Olympus [id number] Colonoscope was                            introduced through the anus and  advanced to the the                            ileocolonic anastomosis. The colonoscopy was                            performed without difficulty. The patient tolerated                            the procedure well. The quality of the bowel                            preparation was good. The terminal ileum and the                            rectum were photographed. Scope In: 3:56:21 PM Scope Out: 4:08:20 PM Scope Withdrawal Time: 0 hours 7 minutes 36 seconds  Total Procedure Duration: 0 hours 11 minutes 59 seconds  Findings:                 The perianal and digital rectal examinations were                            normal.                           There was evidence of a prior side-to-side                            ileo-colonic anastomosis in the transverse colon.                            This was patent and was characterized by healthy                            appearing mucosa.                           Two sessile polyps were found in the transverse  colon. The polyps were 3 to 7 mm in size. These                            polyps were removed with a cold snare. Resection                            and retrieval were complete.                           Non-bleeding external and internal hemorrhoids were                            found during retroflexion. The hemorrhoids were                            medium-sized. Complications:            No immediate complications. Estimated Blood Loss:     Estimated blood loss was minimal. Impression:               - Patent side-to-side ileo-colonic anastomosis,                            characterized by healthy appearing mucosa.                           - Two 3 to 7 mm polyps in the transverse colon,                            removed with a cold snare. Resected and retrieved.                           - Non-bleeding external and internal hemorrhoids. Recommendation:           - Resume previous diet.                            - Continue present medications.                           - Await pathology results.                           - No repeat colonoscopy due to age. Mauri Pole, MD 05/03/2022 4:27:40 PM This report has been signed electronically.

## 2022-05-04 ENCOUNTER — Telehealth: Payer: Self-pay

## 2022-05-04 NOTE — Telephone Encounter (Signed)
No answer, left message to call if having any issues or concerns, B.Logen Fowle RN 

## 2022-05-10 ENCOUNTER — Encounter: Payer: Self-pay | Admitting: Gastroenterology

## 2022-05-10 ENCOUNTER — Telehealth: Payer: Self-pay | Admitting: Hematology

## 2022-05-10 NOTE — Telephone Encounter (Signed)
Contacted patient to scheduled appointments. Left message with appointment details and a call back number if patient had any questions or could not accommodate the time we provided.   

## 2022-05-19 ENCOUNTER — Inpatient Hospital Stay: Payer: Medicare Other

## 2022-05-19 ENCOUNTER — Inpatient Hospital Stay: Payer: Medicare Other | Admitting: Hematology

## 2022-05-19 ENCOUNTER — Other Ambulatory Visit: Payer: Self-pay

## 2022-05-19 DIAGNOSIS — C182 Malignant neoplasm of ascending colon: Secondary | ICD-10-CM

## 2022-05-19 NOTE — Progress Notes (Signed)
[place] OFFICE PROGRESS NOTE  Vickie Rasmussen, MD [address] [place]  [id number]  DIAGNOSIS: f/u of colon cancer   Oncology History Overview Note   Cancer Staging  Cancer of right colon [facility]) Staging form: Colon and Rectum, AJCC 8th Edition - Pathologic stage from 04/05/2021: Stage IIIB (pT3, pN1a, cM0) - Signed by Truitt Merle, MD on 04/23/2021    Cancer of right colon [facility])  02/08/2021 Imaging   CLINICAL DATA:  Right lower quadrant abdominal pain. Nausea and fever.   EXAM: CT ABDOMEN AND PELVIS WITH CONTRAST  IMPRESSION: 1. Distal ascending colon with associated pericolonic fat stranding and prominent but nonenlarged mesenteric lymph nodes. Findings suggestive of malignancy. Differential diagnosis of infection. Recommend colonoscopy after course of infection treatment to exclude malignancy. 2. Tiny hiatal hernia. 3. Nonobstructive punctate left nephrolithiasis.   02/24/2021 Procedure   Colonoscopy, Dr. Silverio Decamp  Impression: - Rule out malignancy, polypoid lesion in the cecum. Biopsied. - Likely malignant partially obstructing tumor in the ascending colon. Biopsied. Tattooed. - Diverticulosis in the sigmoid colon. - Non-bleeding external and internal hemorrhoids.   02/24/2021 Initial Biopsy   Diagnosis 1. Cecum Biopsy, Mass - FRAGMENTS OF TUBULOVILLOUS ADENOMA. - NO HIGH GRADE DYSPLASIA OR INVASIVE CARCINOMA. 2. Ascending Colon Biopsy, Mass - ADENOCARCINOMA.   03/05/2021 Imaging   EXAM: CT CHEST WITH CONTRAST  IMPRESSION: No evidence of metastatic disease in the chest.   Heterogeneous, enlarged thyroid with multiple nodules measuring up to 2.2 cm. Recommend thyroid US (ref: J Am Coll Radiol. 2015 Feb;12(2): 143-50).   Coronary artery disease.   Aortic Atherosclerosis (ICD10-I70.0).   04/05/2021 Cancer Staging   Staging form: Colon and Rectum, AJCC 8th Edition - Pathologic stage from 04/05/2021: Stage IIIB (pT3, pN1a, cM0) -  Signed by Truitt Merle, MD on 04/23/2021 Total positive nodes: 1 Histologic grading system: 4 grade system Histologic grade (G): G2 Residual tumor (R): R0 - None   04/05/2021 Definitive Surgery   FINAL MICROSCOPIC DIAGNOSIS:   A. COLON, RIGHT, RESECTION:  - Invasive moderately differentiated adenocarcinoma.  - Separate tubulovillous adenoma.  - All surgical margins negative for dysplasia and malignancy.  - One of thirty-six lymph nodes involved by metastatic adenocarcinoma (1/36).  - See Oncology Table.   ADDENDUM:  Mismatch Repair Protein (IHC)  SUMMARY INTERPRETATION: NORMAL    04/23/2021 Initial Diagnosis   Cancer of right colon ([place])     Current therapy: surveillance   INTERVAL HISTORY: Vickie Holloway 77 y.o. female returns to the clinic today for a follow-up visit.  The patient was last seen by Dr. Burr Medico on 02/16/2022.  The patient is currently on observation for history of colorectal cancer.  She finished her adjuvant treatment in July 2023.  She is overall feeling well today.  In the interval since last being seen, she recently had a repeat colonoscopy under the care of Dr. Silverio Decamp 05/03/2022 which did not show any malignancy.  She had 2 sessile polyps that were biopsied which were negative for malignancy.  She denies any recent fever, chills, night sweats, or unexplained weight loss.  Denies any appetite changes.  Denies any unusual abdominal pain or back pain.  Denies any nausea, vomiting, diarrhea, or constipation.  Denies any chest pain, shortness of breath, cough, or hemoptysis.  Denies any melena or hematochezia.  She mentions she is completing antibiotics now for UTI. She is here today for evaluation and repeat lab work.    MEDICAL HISTORY: Past Medical History:  Diagnosis Date  Allergy    Anemia    Anxiety    Arthritis    Cataract    Colon cancer ([place])    Depression    Hyperlipidemia    Hypertension    Insomnia     ALLERGIES:  has No Known  Allergies.  MEDICATIONS:  Current Outpatient Medications  Medication Sig Dispense Refill   Ascorbic Acid (VITAMIN C) 1000 MG tablet Take 1,000 mg by mouth daily.     Calcium Carb-Cholecalciferol (CALCIUM 500 + D PO) Take 2 tablets by mouth daily.     lamoTRIgine (LAMICTAL) 200 MG tablet Take 200 mg by mouth daily.     Magnesium 400 MG TABS Take 400 mg by mouth daily.     Melatonin 10 MG TABS Take 10 mg by mouth at bedtime as needed (sleep).     Multiple Vitamins-Minerals (HAIR SKIN AND NAILS FORMULA) TABS Take 3 tablets by mouth daily.     Multiple Vitamins-Minerals (MULTIVITAMIN ADULT) CHEW Chew 1 tablet by mouth daily.     olmesartan (BENICAR) 20 MG tablet Take 20 mg by mouth daily.     OVER THE COUNTER MEDICATION Vitamin D 3 one capsule daily.     spironolactone (ALDACTONE) 25 MG tablet Take 25 mg by mouth daily.     Zinc 50 MG TABS Take 50 mg by mouth daily.     No current facility-administered medications for this visit.    SURGICAL HISTORY:  Past Surgical History:  Procedure Laterality Date   ABDOMINAL HYSTERECTOMY  03/22/1983   CATARACT EXTRACTION W/ INTRAOCULAR LENS IMPLANT Left    COLONOSCOPY     COSMETIC SURGERY  03/21/1997   face lift   DILATION AND CURETTAGE OF UTERUS     LAPAROSCOPIC RIGHT HEMI COLECTOMY Right 04/05/2021   Procedure: LAPAROSCOPIC RIGHT HEMI COLECTOMY;  Surgeon: Ileana Roup, MD;  Location: [facility];  Service: General;  Laterality: Right;   ORIF WRIST FRACTURE Left 01/01/2013   Procedure: OPEN REDUCTION INTERNAL FIXATION (ORIF)OF LEFT DISTAL RADIUS FRACTURE;  Surgeon: Cammie Sickle., MD;  Location: [place];  Service: Orthopedics;  Laterality: Left;   REFRACTIVE SURGERY     TONSILLECTOMY      REVIEW OF SYSTEMS:   Review of Systems  Constitutional: Negative for appetite change, chills, fatigue, fever and unexpected weight change.  HENT:   Negative for mouth sores, nosebleeds, sore throat and trouble swallowing.   Eyes:  Negative for eye problems and icterus.  Respiratory: Negative for cough, hemoptysis, shortness of breath and wheezing.   Cardiovascular: Negative for chest pain and leg swelling.  Gastrointestinal: Negative for abdominal pain, constipation, diarrhea, nausea and vomiting.  Genitourinary: Negative for bladder incontinence, difficulty urinating, dysuria, frequency and hematuria.   Musculoskeletal: Negative for back pain, gait problem, neck pain and neck stiffness.  Skin: Negative for itching and rash.  Neurological: Negative for dizziness, extremity weakness, gait problem, headaches, light-headedness and seizures.  Hematological: Negative for adenopathy. Does not bruise/bleed easily.  Psychiatric/Behavioral: Negative for confusion, depression and sleep disturbance. The patient is not nervous/anxious.     PHYSICAL EXAMINATION:  Blood pressure 139/77, pulse 69, temperature 97.9 F (36.6 C), resp. rate 18, weight 126 lb 4.8 oz (57.3 kg), SpO2 99 %.  ECOG PERFORMANCE STATUS: 1  Physical Exam  Constitutional: Oriented to person, place, and time and well-developed, well-nourished, and in no distress.  HENT:  Head: Normocephalic and atraumatic.  Mouth/Throat: Oropharynx is clear and moist. No oropharyngeal exudate.  Eyes: Conjunctivae are normal. Right  eye exhibits no discharge. Left eye exhibits no discharge. No scleral icterus.  Neck: Normal range of motion. Neck supple.  Cardiovascular: Normal rate, regular rhythm, normal heart sounds and intact distal pulses.   Pulmonary/Chest: Effort normal and breath sounds normal. No respiratory distress. No wheezes. No rales.  Abdominal: Soft. Bowel sounds are normal. Exhibits no distension and no mass. There is no tenderness.  Musculoskeletal: Normal range of motion. Exhibits no edema.  Lymphadenopathy:    No cervical adenopathy.  Neurological: Alert and oriented to person, place, and time. Exhibits normal muscle tone. Gait normal. Coordination normal.   Skin: Skin is warm and dry. No rash noted. Not diaphoretic. No erythema. No pallor.  Psychiatric: Mood, memory and judgment normal.  Vitals reviewed.  LABORATORY DATA: Lab Results  Component Value Date   WBC 4.7 05/20/2022   HGB 14.2 05/20/2022   HCT 42.7 05/20/2022   MCV 96.0 05/20/2022   PLT 286 05/20/2022      Chemistry      Component Value Date/Time   NA 142 02/16/2022 1059   K 4.3 02/16/2022 1059   CL 106 02/16/2022 1059   CO2 31 02/16/2022 1059   BUN 13 02/16/2022 1059   CREATININE 0.75 02/16/2022 1059   CREATININE 0.75 08/12/2021 1315   CREATININE 0.68 09/03/2012 1606      Component Value Date/Time   CALCIUM 10.1 02/16/2022 1059   ALKPHOS 91 02/16/2022 1059   AST 32 02/16/2022 1059   AST 22 08/12/2021 1315   ALT 23 02/16/2022 1059   ALT 17 08/12/2021 1315   BILITOT 0.5 02/16/2022 1059   BILITOT 0.8 08/12/2021 1315       RADIOGRAPHIC STUDIES:  No results found.   ASSESSMENT/PLAN:  Vickie Holloway is a 77 y.o. female with    1. Cancer of Right Colon, Stage IIIB p(T3, N1aM0), MSI-S -presented to ED with RLQ pain on 02/08/21. Colonoscopy on 02/24/21 showed a partially obstructing tumor in ascending colon. Pathology showed adenocarcinoma. Staging chest CT on 03/05/21 was negative. -right hemi-colectomy on 04/05/21 with Dr. Dema Severin showed 4.7 cm invasive moderately differentiated adenocarcinoma. Margins were negative, one lymph node was positive (1/36). -given the positive lymph node, she completed adjuvant Xeloda 05/03/21 - 10/17/21. -surveillance CT CAP 11/11/21 showed: no convincing evidence of metastatic disease; clustered nodularity in RLL favored infectious/inflammatory. f/u CT in late 01/2022 w/ no evidence of recurrence.  -Recently had colonoscopy by Dr. Silverio Decamp on 05/03/22 which was negative -Labs were reviewed.  CBC unremarkable. CMP pending. CEA is pending at this time.  No clinical or lab evidence to suggest disease recurrence.  We will see her back for  follow-up visit in 3 months for evaluation and repeat lab work. I will clarify with Dr. Burr Medico today about when next surveillance scan and will send the patient my chart message on when to expect this next.    2. Prominent left ovarian tissue -seen on surveillance CT CAP 11/11/21 -pelvis MRI on 12/06/21 was normal/negative.   3. Anxiety and depression -continue meds per PCP -she reports recent stressors involving her children. Dr. Burr Medico previously referred her to Dr. Michail Sermon.  4. thyroid nodule  -seen on surveillance CT CAP 11/11/21 -thyroid US on 11/19/21 showed multinodular goiter. A 1.9 cm nodule in right superior gland meets criteria for FNA biopsy. -biopsy performed 12/23/21 was benign.    PLAN: -Lab and f/u in 3 months  -Will clarify with Dr. Burr Medico if CT AP and chest should be performed soon -Will call the patient if  any concerning findings on her pending lab studies   No orders of the defined types were placed in this encounter.    The total time spent in the appointment was 20-29 minutes.   Thornton Dohrmann L Umeka Wrench, PA-C 05/20/22

## 2022-05-20 ENCOUNTER — Inpatient Hospital Stay: Payer: Medicare Other | Attending: Physician Assistant

## 2022-05-20 ENCOUNTER — Inpatient Hospital Stay (HOSPITAL_BASED_OUTPATIENT_CLINIC_OR_DEPARTMENT_OTHER): Payer: Medicare Other | Admitting: Physician Assistant

## 2022-05-20 ENCOUNTER — Other Ambulatory Visit: Payer: Self-pay | Admitting: Physician Assistant

## 2022-05-20 ENCOUNTER — Other Ambulatory Visit: Payer: Self-pay

## 2022-05-20 VITALS — BP 139/77 | HR 69 | Temp 97.9°F | Resp 18 | Wt 126.3 lb

## 2022-05-20 DIAGNOSIS — K635 Polyp of colon: Secondary | ICD-10-CM | POA: Diagnosis not present

## 2022-05-20 DIAGNOSIS — E042 Nontoxic multinodular goiter: Secondary | ICD-10-CM | POA: Insufficient documentation

## 2022-05-20 DIAGNOSIS — F418 Other specified anxiety disorders: Secondary | ICD-10-CM | POA: Diagnosis not present

## 2022-05-20 DIAGNOSIS — C182 Malignant neoplasm of ascending colon: Secondary | ICD-10-CM | POA: Diagnosis not present

## 2022-05-20 DIAGNOSIS — I10 Essential (primary) hypertension: Secondary | ICD-10-CM | POA: Insufficient documentation

## 2022-05-20 DIAGNOSIS — Z85038 Personal history of other malignant neoplasm of large intestine: Secondary | ICD-10-CM | POA: Insufficient documentation

## 2022-05-20 DIAGNOSIS — Z90710 Acquired absence of both cervix and uterus: Secondary | ICD-10-CM | POA: Diagnosis not present

## 2022-05-20 LAB — CMP (CANCER CENTER ONLY)
ALT: 16 U/L (ref 0–44)
AST: 25 U/L (ref 15–41)
Albumin: 4.3 g/dL (ref 3.5–5.0)
Alkaline Phosphatase: 81 U/L (ref 38–126)
Anion gap: 7 (ref 5–15)
BUN: 17 mg/dL (ref 8–23)
CO2: 28 mmol/L (ref 22–32)
Calcium: 9.5 mg/dL (ref 8.9–10.3)
Chloride: 105 mmol/L (ref 98–111)
Creatinine: 0.82 mg/dL (ref 0.44–1.00)
GFR, Estimated: >60 mL/min (ref >60)
Glucose, Bld: 84 mg/dL (ref 70–99)
Potassium: 4 mmol/L (ref 3.5–5.1)
Sodium: 140 mmol/L (ref 135–145)
Total Bilirubin: 0.6 mg/dL (ref 0.3–1.2)
Total Protein: 7.4 g/dL (ref 6.5–8.1)

## 2022-05-20 LAB — FERRITIN: Ferritin: 43 ng/mL (ref 11–307)

## 2022-05-20 LAB — CBC WITH DIFFERENTIAL (CANCER CENTER ONLY)
Abs Immature Granulocytes: 0.02 10*3/uL (ref 0.00–0.07)
Basophils Absolute: 0.1 10*3/uL (ref 0.0–0.1)
Basophils Relative: 1 %
Eosinophils Absolute: 0 10*3/uL (ref 0.0–0.5)
Eosinophils Relative: 1 %
HCT: 42.7 % (ref 36.0–46.0)
Hemoglobin: 14.2 g/dL (ref 12.0–15.0)
Immature Granulocytes: 0 %
Lymphocytes Relative: 22 %
Lymphs Abs: 1 10*3/uL (ref 0.7–4.0)
MCH: 31.9 pg (ref 26.0–34.0)
MCHC: 33.3 g/dL (ref 30.0–36.0)
MCV: 96 fL (ref 80.0–100.0)
Monocytes Absolute: 0.4 10*3/uL (ref 0.1–1.0)
Monocytes Relative: 8 %
Neutro Abs: 3.2 10*3/uL (ref 1.7–7.7)
Neutrophils Relative %: 68 %
Platelet Count: 286 10*3/uL (ref 150–400)
RBC: 4.45 MIL/uL (ref 3.87–5.11)
RDW: 12.9 % (ref 11.5–15.5)
WBC Count: 4.7 10*3/uL (ref 4.0–10.5)
nRBC: 0 % (ref 0.0–0.2)

## 2022-05-20 LAB — CEA (ACCESS): CEA (CHCC): 1.57 ng/mL (ref 0.00–5.00)

## 2022-05-20 NOTE — Progress Notes (Signed)
I clarified with Dr. Burr Medico and Dr. Burr Medico would recommend a CT scan of the abdomen and pelvis every 6 months.  Her last one was in August 2023 and she is due for a repeat CT scan of her abdomen and pelvis.  The patient did have a CT of the chest in November 2023 to follow-up on lung nodules and that scan at that time was normal.  Therefore, Dr. Burr Medico thinks that we can skip CT scan of the chest at this time but can discuss it next time she is due for her next surveillance CT scan.  I relayed these instructions to the patient.  She expressed understanding.

## 2022-06-14 ENCOUNTER — Encounter (HOSPITAL_COMMUNITY): Payer: Self-pay

## 2022-06-14 ENCOUNTER — Ambulatory Visit (HOSPITAL_COMMUNITY)
Admission: RE | Admit: 2022-06-14 | Discharge: 2022-06-14 | Disposition: A | Discharge status: Home / Self Care | Payer: Medicare Other | Source: Ambulatory Visit | Attending: Physician Assistant | Admitting: Physician Assistant

## 2022-06-14 DIAGNOSIS — C182 Malignant neoplasm of ascending colon: Secondary | ICD-10-CM | POA: Diagnosis present

## 2022-06-14 MED ORDER — SODIUM CHLORIDE (PF) 0.9 % IJ SOLN
INTRAMUSCULAR | Status: AC
  Filled 2022-06-14: qty 50

## 2022-06-14 MED ORDER — IOHEXOL 9 MG/ML PO SOLN
ORAL | Status: AC
  Filled 2022-06-14: qty 1000

## 2022-06-14 MED ORDER — IOHEXOL 9 MG/ML PO SOLN
500.0000 mL | ORAL | Status: AC
  Administered 2022-06-14 (×2): 500 mL via ORAL

## 2022-06-14 MED ORDER — IOHEXOL 300 MG/ML  SOLN
100.0000 mL | Freq: Once | INTRAMUSCULAR | Status: AC | PRN
  Administered 2022-06-14: 100 mL via INTRAVENOUS

## 2022-06-17 ENCOUNTER — Telehealth: Payer: Self-pay

## 2022-06-17 NOTE — Telephone Encounter (Signed)
-----   Message from [employer], PA-C sent at 06/16/2022  6:48 AM EDT ----- Can you let her know her scan looked good and no findings for recurrence or new cancer.  ----- Message ----- From: Interface, Rad Results In Sent: 06/16/2022  12:52 AM EDT To: Tobe Sos Heilingoetter, PA-C

## 2022-06-17 NOTE — Telephone Encounter (Signed)
This nurse reached out to patient related to Scan results and Provider recommendations.  No answer, this left a message for patient to return call to clinic or check My Chart message.  No further questions or concerns noted at this time.

## 2022-07-20 ENCOUNTER — Other Ambulatory Visit: Payer: Self-pay

## 2022-08-25 ENCOUNTER — Inpatient Hospital Stay (HOSPITAL_BASED_OUTPATIENT_CLINIC_OR_DEPARTMENT_OTHER): Payer: Medicare Other | Admitting: Hematology

## 2022-08-25 ENCOUNTER — Other Ambulatory Visit: Payer: Self-pay

## 2022-08-25 ENCOUNTER — Inpatient Hospital Stay: Payer: Medicare Other | Attending: Physician Assistant

## 2022-08-25 ENCOUNTER — Encounter: Payer: Self-pay | Admitting: Hematology

## 2022-08-25 VITALS — BP 138/62 | HR 70 | Temp 98.2°F | Resp 15 | Ht 65.0 in | Wt 120.4 lb

## 2022-08-25 DIAGNOSIS — Z90710 Acquired absence of both cervix and uterus: Secondary | ICD-10-CM | POA: Diagnosis not present

## 2022-08-25 DIAGNOSIS — Z862 Personal history of diseases of the blood and blood-forming organs and certain disorders involving the immune mechanism: Secondary | ICD-10-CM | POA: Diagnosis not present

## 2022-08-25 DIAGNOSIS — E7800 Pure hypercholesterolemia, unspecified: Secondary | ICD-10-CM | POA: Diagnosis not present

## 2022-08-25 DIAGNOSIS — Z85038 Personal history of other malignant neoplasm of large intestine: Secondary | ICD-10-CM | POA: Diagnosis present

## 2022-08-25 DIAGNOSIS — C182 Malignant neoplasm of ascending colon: Secondary | ICD-10-CM

## 2022-08-25 LAB — CBC WITH DIFFERENTIAL (CANCER CENTER ONLY)
Abs Immature Granulocytes: 0.02 10*3/uL (ref 0.00–0.07)
Basophils Absolute: 0.1 10*3/uL (ref 0.0–0.1)
Basophils Relative: 1 %
Eosinophils Absolute: 0 10*3/uL (ref 0.0–0.5)
Eosinophils Relative: 1 %
HCT: 40.7 % (ref 36.0–46.0)
Hemoglobin: 13.2 g/dL (ref 12.0–15.0)
Immature Granulocytes: 1 %
Lymphocytes Relative: 31 %
Lymphs Abs: 1.2 10*3/uL (ref 0.7–4.0)
MCH: 31.1 pg (ref 26.0–34.0)
MCHC: 32.4 g/dL (ref 30.0–36.0)
MCV: 96 fL (ref 80.0–100.0)
Monocytes Absolute: 0.4 10*3/uL (ref 0.1–1.0)
Monocytes Relative: 9 %
Neutro Abs: 2.3 10*3/uL (ref 1.7–7.7)
Neutrophils Relative %: 57 %
Platelet Count: 244 10*3/uL (ref 150–400)
RBC: 4.24 MIL/uL (ref 3.87–5.11)
RDW: 12.2 % (ref 11.5–15.5)
WBC Count: 3.9 10*3/uL — ABNORMAL LOW (ref 4.0–10.5)
nRBC: 0 % (ref 0.0–0.2)

## 2022-08-25 LAB — CMP (CANCER CENTER ONLY)
ALT: 16 U/L (ref 0–44)
AST: 23 U/L (ref 15–41)
Albumin: 4.5 g/dL (ref 3.5–5.0)
Alkaline Phosphatase: 76 U/L (ref 38–126)
Anion gap: 5 (ref 5–15)
BUN: 17 mg/dL (ref 8–23)
CO2: 32 mmol/L (ref 22–32)
Calcium: 9.7 mg/dL (ref 8.9–10.3)
Chloride: 104 mmol/L (ref 98–111)
Creatinine: 0.95 mg/dL (ref 0.44–1.00)
GFR, Estimated: >60 mL/min (ref >60)
Glucose, Bld: 91 mg/dL (ref 70–99)
Potassium: 4.6 mmol/L (ref 3.5–5.1)
Sodium: 141 mmol/L (ref 135–145)
Total Bilirubin: 0.6 mg/dL (ref 0.3–1.2)
Total Protein: 7 g/dL (ref 6.5–8.1)

## 2022-08-25 LAB — CEA (ACCESS): CEA (CHCC): 1.49 ng/mL (ref 0.00–5.00)

## 2022-08-25 LAB — FERRITIN: Ferritin: 82 ng/mL (ref 11–307)

## 2022-08-25 NOTE — Progress Notes (Signed)
[facility]   [phone] [phone]   Clinic Follow up Note   Patient Care Team: Dois Davenport, MD as PCP - General (Family Medicine) Malachy Mood, MD as Consulting Physician (Oncology)  Date of Service:  08/25/2022  CHIEF COMPLAINT: f/u of colon cancer   CURRENT THERAPY:  surveillance   ASSESSMENT:  Vickie Holloway is a 77 y.o. female with   Cancer of right colon ([facility]) Stage IIIB p(T3, N1aM0), MSI-S -presented to ED with RLQ pain on 02/08/21. Colonoscopy on 02/24/21 showed a partially obstructing tumor in ascending colon. Pathology showed adenocarcinoma. Staging chest CT on 03/05/21 was negative. -right hemi-colectomy on 04/05/21 with Dr. Cliffton Asters showed 4.7 cm invasive moderately differentiated adenocarcinoma. Margins were negative, one lymph node was positive (1/36). -given the positive lymph node, she completed adjuvant Xeloda 05/03/21 - 10/17/21. -surveillance CT CAP 11/11/21 showed: no convincing evidence of metastatic disease; clustered nodularity in RLL favored infectious/inflammatory.  -Repeated CT chest 01/2022 showed resolved infiltrates in the right lung, no evidence of recurrence. -Surveillance CT of abdomen pelvis in March 2024 was negative for recurrence. -She is clinically doing well, asymptomatic, lab reviewed, exam was unremarkable, no clinical concern for recurrence. -Continue cancer surveillance, follow-up in 3 months, plan to repeat CT scan in 6 months.    PLAN: -lab reviewed -repeat CT  CAP scan in 6 months -lab and f/u in 3 months   SUMMARY OF ONCOLOGIC HISTORY: Oncology History Overview Note   Cancer Staging  Cancer of right colon [facility]) Staging form: Colon and Rectum, AJCC 8th Edition - Pathologic stage from 04/05/2021: Stage IIIB (pT3, pN1a, cM0) - Signed by Malachy Mood, MD on 04/23/2021    Cancer of right colon [facility])  02/08/2021 Imaging   CLINICAL DATA:  Right lower quadrant abdominal pain. Nausea and fever.   EXAM: CT  ABDOMEN AND PELVIS WITH CONTRAST  IMPRESSION: 1. Distal ascending colon with associated pericolonic fat stranding and prominent but nonenlarged mesenteric lymph nodes. Findings suggestive of malignancy. Differential diagnosis of infection. Recommend colonoscopy after course of infection treatment to exclude malignancy. 2. Tiny hiatal hernia. 3. Nonobstructive punctate left nephrolithiasis.   02/24/2021 Procedure   Colonoscopy, Dr. Lavon Paganini  Impression: - Rule out malignancy, polypoid lesion in the cecum. Biopsied. - Likely malignant partially obstructing tumor in the ascending colon. Biopsied. Tattooed. - Diverticulosis in the sigmoid colon. - Non-bleeding external and internal hemorrhoids.   02/24/2021 Initial Biopsy   Diagnosis 1. Cecum Biopsy, Mass - FRAGMENTS OF TUBULOVILLOUS ADENOMA. - NO HIGH GRADE DYSPLASIA OR INVASIVE CARCINOMA. 2. Ascending Colon Biopsy, Mass - ADENOCARCINOMA.   03/05/2021 Imaging   EXAM: CT CHEST WITH CONTRAST  IMPRESSION: No evidence of metastatic disease in the chest.   Heterogeneous, enlarged thyroid with multiple nodules measuring up to 2.2 cm. Recommend thyroid US (ref: J Am Coll Radiol. 2015 Feb;12(2): 143-50).   Coronary artery disease.   Aortic Atherosclerosis (ICD10-I70.0).   04/05/2021 Cancer Staging   Staging form: Colon and Rectum, AJCC 8th Edition - Pathologic stage from 04/05/2021: Stage IIIB (pT3, pN1a, cM0) - Signed by Malachy Mood, MD on 04/23/2021 Total positive nodes: 1 Histologic grading system: 4 grade system Histologic grade (G): G2 Residual tumor (R): R0 - None   04/05/2021 Definitive Surgery   FINAL MICROSCOPIC DIAGNOSIS:   A. COLON, RIGHT, RESECTION:  - Invasive moderately differentiated adenocarcinoma.  - Separate tubulovillous adenoma.  - All surgical margins negative for dysplasia and malignancy.  - One of thirty-six lymph nodes involved by metastatic adenocarcinoma (1/36).  -  See Oncology Table.   ADDENDUM:   Mismatch Repair Protein (IHC)  SUMMARY INTERPRETATION: NORMAL    04/23/2021 Initial Diagnosis   Cancer of right colon ([facility])      INTERVAL HISTORY:  Vickie Holloway is here for a follow up of colon cancer . She was last seen by PA-C Cassie on 05/20/2022. She presents to the clinic alone. Pt state that she is doing well, but has been diagnose with high cholesterol. Pt denies having any issues and he BM is 3-4 times a day.    All other systems were reviewed with the patient and are negative.  MEDICAL HISTORY:  Past Medical History:  Diagnosis Date   Allergy    Anemia    Anxiety    Arthritis    Cataract    Colon cancer (HCC)    Depression    Hyperlipidemia    Hypertension    Insomnia     SURGICAL HISTORY: Past Surgical History:  Procedure Laterality Date   ABDOMINAL HYSTERECTOMY  03/22/1983   CATARACT EXTRACTION W/ INTRAOCULAR LENS IMPLANT Left    COLONOSCOPY     COSMETIC SURGERY  03/21/1997   face lift   DILATION AND CURETTAGE OF UTERUS     LAPAROSCOPIC RIGHT HEMI COLECTOMY Right 04/05/2021   Procedure: LAPAROSCOPIC RIGHT HEMI COLECTOMY;  Surgeon: Andria Meuse, MD;  Location: [facility];  Service: General;  Laterality: Right;   ORIF WRIST FRACTURE Left 01/01/2013   Procedure: OPEN REDUCTION INTERNAL FIXATION (ORIF)OF LEFT DISTAL RADIUS FRACTURE;  Surgeon: Wyn Forster., MD;  Location: [facility];  Service: Orthopedics;  Laterality: Left;   REFRACTIVE SURGERY     TONSILLECTOMY      I have reviewed the social history and family history with the patient and they are unchanged from previous note.  ALLERGIES:  has No Known Allergies.  MEDICATIONS:  Current Outpatient Medications  Medication Sig Dispense Refill   Ascorbic Acid (VITAMIN C) 1000 MG tablet Take 1,000 mg by mouth daily.     Calcium Carb-Cholecalciferol (CALCIUM 500 + D PO) Take 2 tablets by mouth daily.     lamoTRIgine (LAMICTAL) 200 MG tablet Take 200 mg by mouth daily.     Magnesium  400 MG TABS Take 400 mg by mouth daily.     Melatonin 10 MG TABS Take 10 mg by mouth at bedtime as needed (sleep).     Multiple Vitamins-Minerals (HAIR SKIN AND NAILS FORMULA) TABS Take 3 tablets by mouth daily.     Multiple Vitamins-Minerals (MULTIVITAMIN ADULT) CHEW Chew 1 tablet by mouth daily.     olmesartan (BENICAR) 20 MG tablet Take 20 mg by mouth daily.     OVER THE COUNTER MEDICATION Vitamin D 3 one capsule daily.     spironolactone (ALDACTONE) 25 MG tablet Take 25 mg by mouth daily.     Zinc 50 MG TABS Take 50 mg by mouth daily.     No current facility-administered medications for this visit.    PHYSICAL EXAMINATION: ECOG PERFORMANCE STATUS: 0 - Asymptomatic  Vitals:   08/25/22 1139  BP: 138/62  Pulse: 70  Resp: 15  Temp: 98.2 F (36.8 C)  SpO2: 100%   Wt Readings from Last 3 Encounters:  08/25/22 120 lb 6.4 oz (54.6 kg)  05/20/22 126 lb 4.8 oz (57.3 kg)  05/03/22 122 lb (55.3 kg)     GENERAL:alert, no distress and comfortable SKIN: skin color normal, no rashes or significant lesions EYES: normal, Conjunctiva are pink and  non-injected, sclera clear  NEURO: alert & oriented x 3 with fluent speech NECK: supple, (+)  Enlarge thyroid , non-tender, without nodularity LYMPH:  (-) no palpable lymphadenopathy in the cervical, axillary  LUNGS: (-)clear to auscultation and percussion with normal breathing effort HEART: (-) regular rate & rhythm and no murmurs and no lower extremity edema ABDOMEN:(-)abdomen soft, (-) non-tender and normal bowel sounds LABORATORY DATA:  I have reviewed the data as listed    Latest Ref Rng & Units 08/25/2022   11:16 AM 05/20/2022    9:42 AM 02/16/2022   10:59 AM  CBC  WBC 4.0 - 10.5 K/uL 3.9  4.7  4.2   Hemoglobin 12.0 - 15.0 g/dL 11.9  14.7  82.9   Hematocrit 36.0 - 46.0 % 40.7  42.7  44.9   Platelets 150 - 400 K/uL 244  286  280         Latest Ref Rng & Units 08/25/2022   11:16 AM 05/20/2022    9:42 AM 02/16/2022   10:59 AM  CMP   Glucose 70 - 99 mg/dL 91  84  92   BUN 8 - 23 mg/dL 17  17  13    Creatinine 0.44 - 1.00 mg/dL 5.62  1.30  8.65   Sodium 135 - 145 mmol/L 141  140  142   Potassium 3.5 - 5.1 mmol/L 4.6  4.0  4.3   Chloride 98 - 111 mmol/L 104  105  106   CO2 22 - 32 mmol/L 32  28  31   Calcium 8.9 - 10.3 mg/dL 9.7  9.5  78.4   Total Protein 6.5 - 8.1 g/dL 7.0  7.4  7.6   Total Bilirubin 0.3 - 1.2 mg/dL 0.6  0.6  0.5   Alkaline Phos 38 - 126 U/L 76  81  91   AST 15 - 41 U/L 23  25  32   ALT 0 - 44 U/L 16  16  23        RADIOGRAPHIC STUDIES: I have personally reviewed the radiological images as listed and agreed with the findings in the report. No results found.    Orders Placed This Encounter  Procedures   CT CHEST ABDOMEN PELVIS W CONTRAST    Standing Status:   Future    Standing Expiration Date:   08/25/2023    Order Specific Question:   If indicated for the ordered procedure, I authorize the administration of contrast media per Radiology protocol    Answer:   Yes    Order Specific Question:   Does the patient have a contrast media/X-ray dye allergy?    Answer:   No    Order Specific Question:   Preferred imaging location?    Answer:   [facility]    Order Specific Question:   Release to patient    Answer:   Immediate    Order Specific Question:   If indicated for the ordered procedure, I authorize the administration of oral contrast media per Radiology protocol    Answer:   Yes   All questions were answered. The patient knows to call the clinic with any problems, questions or concerns. No barriers to learning was detected. The total time spent in the appointment was 20 minutes.     Malachy Mood, MD 08/25/2022   Carolin Coy, CMA, am acting as scribe for Malachy Mood, MD.   I have reviewed the above documentation for accuracy and completeness, and I agree with the  above.

## 2022-08-25 NOTE — Assessment & Plan Note (Signed)
Stage IIIB p(T3, N1aM0), MSI-S -presented to ED with RLQ pain on 02/08/21. Colonoscopy on 02/24/21 showed a partially obstructing tumor in ascending colon. Pathology showed adenocarcinoma. Staging chest CT on 03/05/21 was negative. -right hemi-colectomy on 04/05/21 with Dr. Cliffton Asters showed 4.7 cm invasive moderately differentiated adenocarcinoma. Margins were negative, one lymph node was positive (1/36). -given the positive lymph node, she completed adjuvant Xeloda 05/03/21 - 10/17/21. -surveillance CT CAP 11/11/21 showed: no convincing evidence of metastatic disease; clustered nodularity in RLL favored infectious/inflammatory. Plan for f/u CT in late 01/2022. -Repeated CT chest 01/2022 showed resolved infiltrates in the right lung, no evidence of recurrence. -Surveillance CT of abdomen pelvis in March 2024 was negative for recurrence.

## 2022-11-18 ENCOUNTER — Telehealth: Payer: Self-pay

## 2022-11-18 ENCOUNTER — Telehealth: Payer: Self-pay | Admitting: Hematology

## 2022-11-18 NOTE — Telephone Encounter (Signed)
Pt called stating she tested (+) for Covid on 11/17/2022.  Pt stated she started having symptoms on 11/16/2022.  Pt stated she's scheduled for lab and f/u next week but would like to reschedule to a later date.  Informed pt that this nurse will notify Dr. Mosetta Putt and Scheduling.  Stated someone from Dr. Latanya Maudlin Scheduling Team will contact her to get her rescheduled.  Pt verbalized understanding and had no further questions or concerns.

## 2022-11-23 ENCOUNTER — Inpatient Hospital Stay: Payer: Medicare Other

## 2022-11-23 ENCOUNTER — Inpatient Hospital Stay: Payer: Medicare Other | Admitting: Physician Assistant

## 2022-12-12 ENCOUNTER — Telehealth: Payer: Self-pay | Admitting: Hematology

## 2022-12-13 ENCOUNTER — Inpatient Hospital Stay: Payer: Medicare Other

## 2022-12-13 ENCOUNTER — Inpatient Hospital Stay: Payer: Medicare Other | Admitting: Hematology

## 2022-12-18 NOTE — Assessment & Plan Note (Signed)
Stage IIIB p(T3, N1aM0), MSI-S -presented to ED with RLQ pain on 02/08/21. Colonoscopy on 02/24/21 showed a partially obstructing tumor in ascending colon. Pathology showed adenocarcinoma. Staging chest CT on 03/05/21 was negative. -right hemi-colectomy on 04/05/21 with Dr. Cliffton Asters showed 4.7 cm invasive moderately differentiated adenocarcinoma. Margins were negative, one lymph node was positive (1/36). -given the positive lymph node, she completed adjuvant Xeloda 05/03/21 - 10/17/21. -surveillance CT CAP 11/11/21 showed: no convincing evidence of metastatic disease; clustered nodularity in RLL favored infectious/inflammatory.  -Repeated CT chest 01/2022 showed resolved infiltrates in the right lung, no evidence of recurrence. -Surveillance CT of abdomen pelvis in March 2024 was negative for recurrence.

## 2022-12-19 ENCOUNTER — Inpatient Hospital Stay (HOSPITAL_BASED_OUTPATIENT_CLINIC_OR_DEPARTMENT_OTHER): Payer: Medicare Other | Admitting: Nurse Practitioner

## 2022-12-19 ENCOUNTER — Inpatient Hospital Stay: Payer: Medicare Other | Attending: Hematology

## 2022-12-19 VITALS — BP 143/74 | HR 71 | Temp 98.8°F | Resp 17 | Ht 65.0 in | Wt 126.4 lb

## 2022-12-19 DIAGNOSIS — C182 Malignant neoplasm of ascending colon: Secondary | ICD-10-CM

## 2022-12-19 DIAGNOSIS — Z862 Personal history of diseases of the blood and blood-forming organs and certain disorders involving the immune mechanism: Secondary | ICD-10-CM | POA: Insufficient documentation

## 2022-12-19 DIAGNOSIS — Z6821 Body mass index (BMI) 21.0-21.9, adult: Secondary | ICD-10-CM | POA: Diagnosis not present

## 2022-12-19 DIAGNOSIS — Z85038 Personal history of other malignant neoplasm of large intestine: Secondary | ICD-10-CM | POA: Diagnosis present

## 2022-12-19 LAB — CBC WITH DIFFERENTIAL (CANCER CENTER ONLY)
Abs Immature Granulocytes: 0.02 10*3/uL (ref 0.00–0.07)
Basophils Absolute: 0 10*3/uL (ref 0.0–0.1)
Basophils Relative: 1 %
Eosinophils Absolute: 0 10*3/uL (ref 0.0–0.5)
Eosinophils Relative: 1 %
HCT: 39.3 % (ref 36.0–46.0)
Hemoglobin: 12.8 g/dL (ref 12.0–15.0)
Immature Granulocytes: 0 %
Lymphocytes Relative: 22 %
Lymphs Abs: 1.2 10*3/uL (ref 0.7–4.0)
MCH: 31.4 pg (ref 26.0–34.0)
MCHC: 32.6 g/dL (ref 30.0–36.0)
MCV: 96.6 fL (ref 80.0–100.0)
Monocytes Absolute: 0.4 10*3/uL (ref 0.1–1.0)
Monocytes Relative: 8 %
Neutro Abs: 3.6 10*3/uL (ref 1.7–7.7)
Neutrophils Relative %: 68 %
Platelet Count: 228 10*3/uL (ref 150–400)
RBC: 4.07 MIL/uL (ref 3.87–5.11)
RDW: 12.1 % (ref 11.5–15.5)
WBC Count: 5.3 10*3/uL (ref 4.0–10.5)
nRBC: 0 % (ref 0.0–0.2)

## 2022-12-19 LAB — CMP (CANCER CENTER ONLY)
ALT: 14 U/L (ref 0–44)
AST: 21 U/L (ref 15–41)
Albumin: 4.5 g/dL (ref 3.5–5.0)
Alkaline Phosphatase: 65 U/L (ref 38–126)
Anion gap: 7 (ref 5–15)
BUN: 22 mg/dL (ref 8–23)
CO2: 28 mmol/L (ref 22–32)
Calcium: 10.3 mg/dL (ref 8.9–10.3)
Chloride: 106 mmol/L (ref 98–111)
Creatinine: 0.85 mg/dL (ref 0.44–1.00)
GFR, Estimated: >60 mL/min (ref >60)
Glucose, Bld: 99 mg/dL (ref 70–99)
Potassium: 4.2 mmol/L (ref 3.5–5.1)
Sodium: 141 mmol/L (ref 135–145)
Total Bilirubin: 0.6 mg/dL (ref 0.3–1.2)
Total Protein: 7 g/dL (ref 6.5–8.1)

## 2022-12-19 LAB — FERRITIN: Ferritin: 70 ng/mL (ref 11–307)

## 2022-12-19 LAB — CEA (ACCESS): CEA (CHCC): 1.05 ng/mL (ref 0.00–5.00)

## 2022-12-19 NOTE — Progress Notes (Addendum)
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 Colonoscopy on 02/24/21 showed a partially obstructing tumor in ascending colon. Pathology showed adenocarcinoma. Staging chest CT on 03/05/21 was negative. -right hemi-colectomy on 04/05/21 with Dr. Cliffton Asters showed 4.7 cm invasive moderately differentiated adenocarcinoma. Margins were negative, one lymph node was positive (1/36). -given the positive lymph node, she completed adjuvant Xeloda 05/03/21 - 10/17/21. -surveillance CT CAP 11/11/21 showed: no convincing evidence of metastatic disease; clustered nodularity in RLL favored infectious/inflammatory.  -Repeated CT chest 01/2022 showed resolved infiltrates in the right lung, no evidence of recurrence. -Surveillance CT of abdomen pelvis in March 2024 was negative for recurrence.   Plan: Labs reviewed  -CBC showing WBC 5.3; Hgb 12.8; Hct 39.3; Plt 228; Anc 3.6 -CMP - K 4.2; glucose 99; BUN 22; Creatinine 0.85; eGFR >60; Ca 10.3; LFTs normal.   -CT CAP will be moved forward to beginning/middle of October.  -telephone visit with Dr. Mosetta Putt a few days after CT scan to review results.   The patient understands the plans discussed today and is in agreement with them.  She knows to contact our office if she develops concerns prior to her next appointment.  I provided 30 minutes of face-to-face time during this encounter and > 50% was spent counseling as documented under my assessment and plan.    Vickie Jews, Vickie Holloway  [place] [facility] AT [facility] [address] [place] [address] Dept: [phone] Dept Fax: [phone]   No orders of the defined types  were placed in this encounter.     CHIEF COMPLAINT:  CC: right colon cancer   Current Treatment:  surveillance   INTERVAL HISTORY:  Vickie Holloway is here today for repeat clinical assessment. She denies fevers or chills. She denies pain. Her appetite is good. Her weight has increased 6 pounds over last 6 months . States that she had COVID 19 a few weeks ago and is still trying to get digestive system back on track. States that she has increased fiber and water intake.   I have reviewed the past medical history, past surgical history, social history and family history with the patient and they are unchanged from previous note.  ALLERGIES:  has No Known Allergies.  MEDICATIONS:  Current Outpatient Medications  Medication Sig Dispense Refill   Calcium Carb-Cholecalciferol (CALCIUM 500 + D PO) Take 2 tablets by mouth daily.     lamoTRIgine (LAMICTAL) 200 MG tablet Take 200 mg by mouth daily.     Magnesium 400 MG TABS Take 400 mg by mouth daily.     Multiple Vitamins-Minerals (MULTIVITAMIN ADULT) CHEW Chew 1 tablet by mouth daily.     olmesartan (BENICAR) 20 MG tablet Take 20 mg by mouth daily.     phytonadione (VITAMIN K) 5 MG tablet Take 5 mg by mouth daily.     spironolactone (ALDACTONE) 25 MG tablet Take 25 mg by mouth daily.     Zinc 50 MG TABS Take 50 mg by mouth daily.     Ascorbic Acid (VITAMIN C) 1000 MG tablet Take 1,000 mg by mouth daily. (Patient not taking: Reported on 12/19/2022)     Melatonin 10 MG TABS  Take 10 mg by mouth at bedtime as needed (sleep). (Patient not taking: Reported on 12/19/2022)     Multiple Vitamins-Minerals (HAIR SKIN AND NAILS FORMULA) TABS Take 3 tablets by mouth daily. (Patient not taking: Reported on 12/19/2022)     OVER THE COUNTER MEDICATION Vitamin D 3 one capsule daily. (Patient not taking: Reported on 12/19/2022)     No current facility-administered medications for this visit.    HISTORY OF PRESENT ILLNESS:   Oncology History Overview Note   Cancer  Staging  Cancer of right colon [facility]) Staging form: Colon and Rectum, AJCC 8th Edition - Pathologic stage from 04/05/2021: Stage IIIB (pT3, pN1a, cM0) - Signed by Malachy Mood, MD on 04/23/2021    Cancer of right colon [facility])  02/08/2021 Imaging   CLINICAL DATA:  Right lower quadrant abdominal pain. Nausea and fever.   EXAM: CT ABDOMEN AND PELVIS WITH CONTRAST  IMPRESSION: 1. Distal ascending colon with associated pericolonic fat stranding and prominent but nonenlarged mesenteric lymph nodes. Findings suggestive of malignancy. Differential diagnosis of infection. Recommend colonoscopy after course of infection treatment to exclude malignancy. 2. Tiny hiatal hernia. 3. Nonobstructive punctate left nephrolithiasis.   02/24/2021 Procedure   Colonoscopy, Dr. Lavon Paganini  Impression: - Rule out malignancy, polypoid lesion in the cecum. Biopsied. - Likely malignant partially obstructing tumor in the ascending colon. Biopsied. Tattooed. - Diverticulosis in the sigmoid colon. - Non-bleeding external and internal hemorrhoids.   02/24/2021 Initial Biopsy   Diagnosis 1. Cecum Biopsy, Mass - FRAGMENTS OF TUBULOVILLOUS ADENOMA. - NO HIGH GRADE DYSPLASIA OR INVASIVE CARCINOMA. 2. Ascending Colon Biopsy, Mass - ADENOCARCINOMA.   03/05/2021 Imaging   EXAM: CT CHEST WITH CONTRAST  IMPRESSION: No evidence of metastatic disease in the chest.   Heterogeneous, enlarged thyroid with multiple nodules measuring up to 2.2 cm. Recommend thyroid US (ref: J Am Coll Radiol. 2015 Feb;12(2): 143-50).   Coronary artery disease.   Aortic Atherosclerosis (ICD10-I70.0).   04/05/2021 Cancer Staging   Staging form: Colon and Rectum, AJCC 8th Edition - Pathologic stage from 04/05/2021: Stage IIIB (pT3, pN1a, cM0) - Signed by Malachy Mood, MD on 04/23/2021 Total positive nodes: 1 Histologic grading system: 4 grade system Histologic grade (G): G2 Residual tumor (R): R0 - None   04/05/2021 Definitive Surgery   FINAL  MICROSCOPIC DIAGNOSIS:   A. COLON, RIGHT, RESECTION:  - Invasive moderately differentiated adenocarcinoma.  - Separate tubulovillous adenoma.  - All surgical margins negative for dysplasia and malignancy.  - One of thirty-six lymph nodes involved by metastatic adenocarcinoma (1/36).  - See Oncology Table.   ADDENDUM:  Mismatch Repair Protein (IHC)  SUMMARY INTERPRETATION: NORMAL    04/23/2021 Initial Diagnosis   Cancer of right colon (HCC)       REVIEW OF SYSTEMS:   Constitutional: Denies fevers, chills or abnormal weight loss Eyes: Denies blurriness of vision Ears, nose, mouth, throat, and face: Denies mucositis or sore throat Respiratory: Denies cough, dyspnea or wheezes Cardiovascular: Denies palpitation, chest discomfort or lower extremity swelling Gastrointestinal:  Denies nausea, heartburn or change in bowel habits Skin: Denies abnormal skin rashes Lymphatics: Denies new lymphadenopathy or easy bruising Neurological:Denies numbness, tingling or new weaknesses Behavioral/Psych: Mood is stable, no new changes  All other systems were reviewed with the patient and are negative.   VITALS:   Today's Vitals   12/19/22 1200 12/19/22 1201  BP: (!) 147/74 (!) 143/74  Pulse: 71   Resp: 17   Temp: 98.8 F (37.1 C)   TempSrc: Oral  SpO2: 98%   Weight: 126 lb 6.4 oz (57.3 kg)   Height: 5\' 5"  (1.651 m)   PainSc: 0-No pain    Body mass index is 21.03 kg/m.   Wt Readings from Last 3 Encounters:  12/19/22 126 lb 6.4 oz (57.3 kg)  08/25/22 120 lb 6.4 oz (54.6 kg)  05/20/22 126 lb 4.8 oz (57.3 kg)    Body mass index is 21.03 kg/m.  Performance status (ECOG): 0 - Asymptomatic  PHYSICAL EXAM:   GENERAL:alert, no distress and comfortable SKIN: skin color, texture, turgor are normal, no rashes or significant lesions EYES: normal, Conjunctiva are pink and non-injected, sclera clear OROPHARYNX:no exudate, no erythema and lips, buccal mucosa, and tongue normal  NECK:  supple, thyroid normal size, non-tender, without nodularity LYMPH:  no palpable lymphadenopathy in the cervical, axillary or inguinal LUNGS: clear to auscultation and percussion with normal breathing effort HEART: regular rate & rhythm and no murmurs and no lower extremity edema ABDOMEN:abdomen soft, non-tender and normal bowel sounds Musculoskeletal:no cyanosis of digits and no clubbing  NEURO: alert & oriented x 3 with fluent speech, no focal motor/sensory deficits  LABORATORY DATA:  I have reviewed the data as listed    Component Value Date/Time   NA 141 12/19/2022 1123   K 4.2 12/19/2022 1123   CL 106 12/19/2022 1123   CO2 28 12/19/2022 1123   GLUCOSE 99 12/19/2022 1123   BUN 22 12/19/2022 1123   CREATININE 0.85 12/19/2022 1123   CREATININE 0.68 09/03/2012 1606   CALCIUM 10.3 12/19/2022 1123   PROT 7.0 12/19/2022 1123   ALBUMIN 4.5 12/19/2022 1123   AST 21 12/19/2022 1123   ALT 14 12/19/2022 1123   ALKPHOS 65 12/19/2022 1123   BILITOT 0.6 12/19/2022 1123   GFRNONAA >60 12/19/2022 1123   Lab Results  Component Value Date   WBC 5.3 12/19/2022   NEUTROABS 3.6 12/19/2022   HGB 12.8 12/19/2022   HCT 39.3 12/19/2022   MCV 96.6 12/19/2022   PLT 228 12/19/2022   Addendum I have seen the patient, examined her. I agree with the assessment and and plan and have edited the notes.   Vickie Holloway is clinically doing well, lab and exam were unremarkable.  She is about 2 years out from her initial diagnosis.  Will continue colon cancer surveillance, plan to repeat CT scan in the next month, and I will call her with the results.  All questions were answered.  Malachy Mood MD 12/19/2022

## 2023-01-10 ENCOUNTER — Ambulatory Visit (HOSPITAL_COMMUNITY)
Admission: RE | Admit: 2023-01-10 | Discharge: 2023-01-10 | Disposition: A | Discharge status: Home / Self Care | Payer: Medicare Other | Source: Ambulatory Visit | Attending: Hematology | Admitting: Hematology

## 2023-01-10 DIAGNOSIS — C182 Malignant neoplasm of ascending colon: Secondary | ICD-10-CM | POA: Diagnosis present

## 2023-01-10 MED ORDER — IOHEXOL 300 MG/ML  SOLN
30.0000 mL | Freq: Once | INTRAMUSCULAR | Status: AC | PRN
  Administered 2023-01-10: 30 mL via ORAL

## 2023-01-10 MED ORDER — IOHEXOL 300 MG/ML  SOLN: 30.0000 mL | Freq: Once | INTRAMUSCULAR | Status: DC | PRN

## 2023-01-10 MED ORDER — IOHEXOL 300 MG/ML  SOLN
100.0000 mL | Freq: Once | INTRAMUSCULAR | Status: AC | PRN
  Administered 2023-01-10: 100 mL via INTRAVENOUS

## 2023-01-13 ENCOUNTER — Other Ambulatory Visit: Payer: Self-pay

## 2023-01-17 ENCOUNTER — Telehealth: Payer: Self-pay

## 2023-01-17 NOTE — Telephone Encounter (Addendum)
Called patient to relay message below spoke with her husband. He voiced full understanding of results.   ----- Message from Malachy Mood sent at 01/13/2023  1:55 PM EDT ----- Jonny Ruiz, let her know her CT showed NED, let her know the kidney stone, thanks   Terrace Arabia ----- Message ----- From: Verlee Rossetti, CMA Sent: 01/13/2023  10:51 AM EDT To: Malachy Mood, MD  Patient called wanting to know results from her CT scan. You can let me know what to tell her or give her a call.

## 2023-02-22 ENCOUNTER — Inpatient Hospital Stay: Payer: Medicare Other | Admitting: Hematology

## 2023-02-22 ENCOUNTER — Other Ambulatory Visit: Payer: Medicare Other

## 2023-02-22 ENCOUNTER — Telehealth: Payer: Self-pay | Admitting: Hematology

## 2023-02-22 ENCOUNTER — Other Ambulatory Visit: Payer: Self-pay

## 2023-02-22 ENCOUNTER — Telehealth: Payer: Self-pay

## 2023-02-22 ENCOUNTER — Ambulatory Visit: Payer: Medicare Other | Admitting: Hematology

## 2023-02-22 NOTE — Assessment & Plan Note (Signed)
Stage IIIB p(T3, N1aM0), MSI-S -presented to ED with RLQ pain on 02/08/21. Colonoscopy on 02/24/21 showed a partially obstructing tumor in ascending colon. Pathology showed adenocarcinoma. Staging chest CT on 03/05/21 was negative. -right hemi-colectomy on 04/05/21 with Dr. Cliffton Asters showed 4.7 cm invasive moderately differentiated adenocarcinoma. Margins were negative, one lymph node was positive (1/36). -given the positive lymph node, she completed adjuvant Xeloda 05/03/21 - 10/17/21. -surveillance CT CAP 11/11/21 showed: no convincing evidence of metastatic disease; clustered nodularity in RLL favored infectious/inflammatory.  -Repeated CT chest 01/2022 showed resolved infiltrates in the right lung, no evidence of recurrence. -Surveillance CT of abdomen pelvis on 01/10/2023 was negative for recurrence.

## 2023-02-22 NOTE — Telephone Encounter (Signed)
LVM stating that Dr. Mosetta Putt would like to cancel the pt's appt today if the pt is OK with canceling.  Stated Dr. Mosetta Putt would like to cancel since Sharlette Dense, CMA has already contacted pt w/CT Scan results.  Stated Dr. Mosetta Putt would like to see the pt back in 2-3 months w/labs if the pt is in agreement.  Requested pt to contact Dr. Latanya Maudlin office if she is in agreement with cancelling appt today and rescheduling in 2-3 months.  Awaiting pt's return call.

## 2023-05-22 ENCOUNTER — Other Ambulatory Visit: Payer: Self-pay

## 2023-05-22 ENCOUNTER — Emergency Department (HOSPITAL_BASED_OUTPATIENT_CLINIC_OR_DEPARTMENT_OTHER)
Admission: EM | Admit: 2023-05-22 | Discharge: 2023-05-22 | Disposition: A | Discharge status: Home / Self Care | Attending: Emergency Medicine | Admitting: Emergency Medicine

## 2023-05-22 ENCOUNTER — Encounter (HOSPITAL_BASED_OUTPATIENT_CLINIC_OR_DEPARTMENT_OTHER): Payer: Self-pay | Admitting: Emergency Medicine

## 2023-05-22 DIAGNOSIS — Z79899 Other long term (current) drug therapy: Secondary | ICD-10-CM | POA: Diagnosis not present

## 2023-05-22 DIAGNOSIS — M549 Dorsalgia, unspecified: Secondary | ICD-10-CM | POA: Diagnosis present

## 2023-05-22 DIAGNOSIS — M62838 Other muscle spasm: Secondary | ICD-10-CM | POA: Diagnosis not present

## 2023-05-22 DIAGNOSIS — M5442 Lumbago with sciatica, left side: Secondary | ICD-10-CM | POA: Diagnosis not present

## 2023-05-22 DIAGNOSIS — M5410 Radiculopathy, site unspecified: Secondary | ICD-10-CM | POA: Insufficient documentation

## 2023-05-22 DIAGNOSIS — M5432 Sciatica, left side: Secondary | ICD-10-CM

## 2023-05-22 MED ORDER — KETOROLAC TROMETHAMINE 15 MG/ML IJ SOLN
15.0000 mg | Freq: Once | INTRAMUSCULAR | Status: AC
  Administered 2023-05-22: 15 mg via INTRAMUSCULAR
  Filled 2023-05-22: qty 1

## 2023-05-22 MED ORDER — LIDOCAINE 5 % EX PTCH: 1.0000 | MEDICATED_PATCH | CUTANEOUS | 0 refills | Status: AC

## 2023-05-22 MED ORDER — OXYCODONE-ACETAMINOPHEN 5-325 MG PO TABS
1.0000 | ORAL_TABLET | Freq: Once | ORAL | Status: DC
  Filled 2023-05-22: qty 1

## 2023-05-22 MED ORDER — LIDOCAINE 5 % EX PTCH
1.0000 | MEDICATED_PATCH | CUTANEOUS | Status: DC
  Administered 2023-05-22: 1 via TRANSDERMAL
  Filled 2023-05-22: qty 1

## 2023-05-22 MED ORDER — METHYLPREDNISOLONE 4 MG PO TBPK: ORAL_TABLET | ORAL | 0 refills | Status: AC

## 2023-05-22 MED ORDER — MORPHINE SULFATE (PF) 4 MG/ML IV SOLN
4.0000 mg | Freq: Once | INTRAVENOUS | Status: AC
  Administered 2023-05-22: 4 mg via INTRAMUSCULAR
  Filled 2023-05-22: qty 1

## 2023-05-22 MED ORDER — CYCLOBENZAPRINE HCL 5 MG PO TABS: 5.0000 mg | ORAL_TABLET | Freq: Two times a day (BID) | ORAL | 0 refills | Status: AC | PRN

## 2023-05-22 MED ORDER — OXYCODONE-ACETAMINOPHEN 5-325 MG PO TABS: 1.0000 | ORAL_TABLET | Freq: Four times a day (QID) | ORAL | 0 refills | Status: AC | PRN

## 2023-05-22 MED ORDER — CYCLOBENZAPRINE HCL 5 MG PO TABS
5.0000 mg | ORAL_TABLET | Freq: Once | ORAL | Status: AC
  Administered 2023-05-22: 5 mg via ORAL
  Filled 2023-05-22: qty 1

## 2023-05-22 NOTE — ED Provider Notes (Signed)
 [place] EMERGENCY DEPARTMENT AT [facility] Provider Note   CSN: [id number] Arrival date & time: 05/22/23  1118     History  Chief Complaint  Patient presents with   Back Pain    Vickie Holloway is a 78 y.o. female.  The history is provided by medical records and the patient. No language interpreter was used.  Back Pain Location:  Sacro-iliac joint Quality:  Shooting and stabbing Radiates to:  L posterior upper leg, L knee and L thigh Pain severity:  Severe Pain is:  Same all the time Onset quality:  Sudden Duration:  2 days Timing:  Constant Progression:  Waxing and waning Chronicity:  Recurrent Context: lifting heavy objects and twisting   Relieved by:  Nothing Worsened by:  Bending and movement Ineffective treatments:  None tried Associated symptoms: no abdominal pain, no bladder incontinence, no bowel incontinence, no chest pain, no dysuria, no fever, no headaches, no leg pain, no numbness, no paresthesias, no perianal numbness, no tingling and no weakness   Risk factors: hx of cancer        Home Medications Prior to Admission medications   Medication Sig Start Date End Date Taking? Authorizing Provider  Ascorbic Acid (VITAMIN C) 1000 MG tablet Take 1,000 mg by mouth daily. Patient not taking: Reported on 12/19/2022    [provider]  Calcium Carb-Cholecalciferol (CALCIUM 500 + D PO) Take 2 tablets by mouth daily.    [provider]  lamoTRIgine (LAMICTAL) 200 MG tablet Take 200 mg by mouth daily. 01/16/21   [provider]  Magnesium 400 MG TABS Take 400 mg by mouth daily.    [provider]  Melatonin 10 MG TABS Take 10 mg by mouth at bedtime as needed (sleep). Patient not taking: Reported on 12/19/2022    [provider]  Multiple Vitamins-Minerals (HAIR SKIN AND NAILS FORMULA) TABS Take 3 tablets by mouth daily. Patient not taking: Reported on 12/19/2022    [provider]  Multiple  Vitamins-Minerals (MULTIVITAMIN ADULT) CHEW Chew 1 tablet by mouth daily.    [provider]  olmesartan (BENICAR) 20 MG tablet Take 20 mg by mouth daily. 01/16/21   [provider]  OVER THE COUNTER MEDICATION Vitamin D 3 one capsule daily. Patient not taking: Reported on 12/19/2022    [provider]  phytonadione (VITAMIN K) 5 MG tablet Take 5 mg by mouth daily.    [provider]  spironolactone (ALDACTONE) 25 MG tablet Take 25 mg by mouth daily.    [provider]  Zinc 50 MG TABS Take 50 mg by mouth daily.    [provider]      Allergies    Patient has no known allergies.    Review of Systems   Review of Systems  Constitutional:  Negative for chills, diaphoresis, fatigue and fever.  HENT:  Negative for congestion.   Respiratory:  Negative for cough, chest tightness, shortness of breath and wheezing.   Cardiovascular:  Negative for chest pain and palpitations.  Gastrointestinal:  Negative for abdominal pain, bowel incontinence, constipation, diarrhea, nausea and vomiting.  Genitourinary:  Negative for bladder incontinence, dysuria, flank pain and frequency.  Musculoskeletal:  Positive for back pain. Negative for neck pain and neck stiffness.  Skin:  Negative for rash and wound.  Neurological:  Negative for tingling, weakness, light-headedness, numbness, headaches and paresthesias.  Psychiatric/Behavioral:  Negative for agitation and confusion.   All other systems reviewed and are negative.   Physical Exam  Updated Vital Signs BP (!) 165/95 (BP Location: Right Arm)   Pulse 71   Temp 97.8 F (36.6 C)   Resp 16   SpO2 99%  Physical Exam Vitals and nursing note reviewed.  Constitutional:      General: She is not in acute distress.    Appearance: She is well-developed. She is not ill-appearing, toxic-appearing or diaphoretic.  HENT:     Head: Normocephalic and atraumatic.     Nose: No congestion or rhinorrhea.      Mouth/Throat:     Mouth: Mucous membranes are moist.     Pharynx: No oropharyngeal exudate or posterior oropharyngeal erythema.  Eyes:     Extraocular Movements: Extraocular movements intact.     Conjunctiva/sclera: Conjunctivae normal.     Pupils: Pupils are equal, round, and reactive to light.  Cardiovascular:     Rate and Rhythm: Normal rate and regular rhythm.     Heart sounds: No murmur heard. Pulmonary:     Effort: Pulmonary effort is normal. No respiratory distress.     Breath sounds: Normal breath sounds. No wheezing, rhonchi or rales.  Chest:     Chest wall: No tenderness.  Abdominal:     General: Abdomen is flat.     Palpations: Abdomen is soft.     Tenderness: There is no abdominal tenderness. There is no right CVA tenderness, left CVA tenderness, guarding or rebound.  Musculoskeletal:        General: Tenderness present. No swelling.     Cervical back: Neck supple. No tenderness.     Thoracic back: No signs of trauma.     Lumbar back: Spasms and tenderness present. No signs of trauma or bony tenderness. Positive left straight leg raise test. Negative right straight leg raise test.       Legs:     Comments: Tenderness and pain in her left buttock going down her left leg.  Straight leg raise positive on left.  Straight leg raise negative on right.  Bones were nontender in the midline of the lumbar spine, more lateral and inferior spasm and tenderness.  Skin:    General: Skin is warm and dry.     Capillary Refill: Capillary refill takes less than 2 seconds.     Findings: No erythema.  Neurological:     General: No focal deficit present.     Mental Status: She is alert.     Sensory: No sensory deficit.     Motor: No weakness.     Gait: Gait (was ableto ambulate) normal.  Psychiatric:        Mood and Affect: Mood normal.     ED Results / Procedures / Treatments   Labs (all labs ordered are listed, but only abnormal results are displayed) Labs Reviewed - No data to  display  EKG None  Radiology No results found.  Procedures Procedures    Medications Ordered in ED Medications  lidocaine (LIDODERM) 5 % 1 patch (1 patch Transdermal Patch Applied 05/22/23 1332)  morphine (PF) 4 MG/ML injection 4 mg (4 mg Intramuscular Given 05/22/23 1330)  ketorolac (TORADOL) 15 MG/ML injection 15 mg (15 mg Intramuscular Given 05/22/23 1331)  cyclobenzaprine (FLEXERIL) tablet 5 mg (5 mg Oral Given 05/22/23 1330)    ED Course/ Medical Decision Making/ A&P                                 Medical Decision Making  Risk Prescription drug management.    Olar Santini is a 78 y.o. female medical history significant previous colon cancer status post hemicolectomy, osteoporosis, and previous sciatic back pain presents with left low back pain going down her left leg.  According to patient, she was lifting some heavy bags of mulch or dirt while working outside 2 days ago on Saturday when she had onset of severe pain.  She reports that the same kind of pain she has had before when she thrown out her back and she said she tried some of the medicines that did not seem to work.  She reports the pain is 10 out of 10 in her left buttock going down her left leg.  She is able to walk on it and denies numbness or weakness.  Denies loss of bowel or bladder control.  She denies any other fall or trauma.  Denies any dysuria or hematuria or history of kidney stones.  She said she recently had a workup with her PCP for her physical and workup was reassuring.  She says she is here to get some relief.  She reports no fevers, chills, congestion, cough, nausea, vomiting, constipation, diarrhea, or urinary changes.  She had this happen on Saturday, 2 days ago.  On exam, patient does have tenderness in the left low back near the buttock area going down.  Her straight leg raise was positive and she had intact sensation strength distally.  She was able to ambulate.  Midline back was nontender on my exam.   Lungs clear otherwise.  We had a shared decision-making conversation discussing different options of management.  We offered lab workup and imaging workup to look for pathologic or occult fracture given her history of cancer then the severe pain however she would rather just get some medicines and see how she does.  We again discussed getting blood work but she said she did not want blood at this time.  Will give her a shot of Toradol and some morphine.  If she will feel better, will discharge for suspected muscle spasm and radicular sciatic type pain with a prescription for a steroid taper, pain medicine, muscle relaxant, and Lidoderm patches.  She understood extremely strict return precautions of any symptoms were to change or worsen that she would likely need a more extensive workup at that time to look for cancer or pathologic fracture that may even need transfer for MRI.                   Final Clinical Impression(s) / ED Diagnoses Final diagnoses:  Left sciatic nerve pain  Acute left-sided low back pain with left-sided sciatica  Muscle spasm  Radicular leg pain    Rx / DC Orders ED Discharge Orders          Ordered    cyclobenzaprine (FLEXERIL) 5 MG tablet  2 times daily PRN        05/22/23 1414    oxyCODONE-acetaminophen (PERCOCET/ROXICET) 5-325 MG tablet  Every 6 hours PRN        05/22/23 1414    lidocaine (LIDODERM) 5 %  Every 24 hours        05/22/23 1414    methylPREDNISolone (MEDROL DOSEPAK) 4 MG TBPK tablet        05/22/23 1414            Clinical Impression: 1. Left sciatic nerve pain   2. Acute left-sided low back pain with left-sided sciatica   3. Muscle spasm  4. Radicular leg pain     Disposition: Discharge  Condition: Good  I have discussed the results, Dx and Tx plan with the pt(& family if present). He/she/they expressed understanding and agree(s) with the plan. Discharge instructions discussed at great length. Strict return precautions  discussed and pt &/or family have verbalized understanding of the instructions. No further questions at time of discharge.    New Prescriptions   CYCLOBENZAPRINE (FLEXERIL) 5 MG TABLET    Take 1 tablet (5 mg total) by mouth 2 (two) times daily as needed for muscle spasms.   LIDOCAINE (LIDODERM) 5 %    Place 1 patch onto the skin daily. Remove & Discard patch within 12 hours or as directed by MD   METHYLPREDNISOLONE (MEDROL DOSEPAK) 4 MG TBPK TABLET    Please follow instructions on Dosepak   OXYCODONE-ACETAMINOPHEN (PERCOCET/ROXICET) 5-325 MG TABLET    Take 1 tablet by mouth every 6 (six) hours as needed for severe pain (pain score 7-10).    Follow Up: Dois Davenport, MD [address] [address] [place] [address] [phone]     [facility] Emergency Department at [facility] [address] [place] [address] [phone]       Deaven Urwin, Canary Brim, MD 05/22/23 1415

## 2023-05-22 NOTE — ED Triage Notes (Signed)
 Low back left side down leg Started Saturday after lifting something Hx sciatica "I think this is my sciatica"   Used baclofen and gabapentin in the past  Took some baclofen with minimal relief today, does not have gaba at home

## 2023-05-22 NOTE — ED Notes (Signed)

## 2023-05-22 NOTE — Discharge Instructions (Signed)
 Your history, exam, and workup today are consistent with muscle spasm and nerve pain going down your left leg likely from doing the lifting when you are doing yard work.  We had a shared decision-making conversation discussing getting labs or imaging however as this has happened in the past and you clearly had spasm and nerve pain, we felt it was reasonable to try symptomatic management first and if symptoms were to change or worsen, we recommend returning for further workup.  Please use the pain medicine, muscle medicine, numbing patches, and steroids to help with the symptoms and follow-up with your back doctor and PCP.  If any symptoms change or worsen acutely, please return to the nearest emergency department.

## 2023-05-22 NOTE — ED Notes (Signed)
 Pt bib [facility] with c/o lower LT side back pain rad to LLE after heavy lifting on Sat. Has baclofen rx 130/78  HR 78 95% 127 CBG

## 2023-05-22 NOTE — ED Notes (Signed)
 Pt took tylenol at 0830

## 2023-05-22 NOTE — ED Notes (Signed)
 Percocet not administered d/t pt taking tylenol at 0830

## 2023-06-19 ENCOUNTER — Ambulatory Visit: Payer: Medicare Other | Admitting: Nurse Practitioner

## 2023-06-19 ENCOUNTER — Ambulatory Visit: Payer: Medicare Other | Admitting: Hematology

## 2023-06-19 ENCOUNTER — Other Ambulatory Visit: Payer: Medicare Other

## 2023-06-21 ENCOUNTER — Other Ambulatory Visit: Payer: Self-pay

## 2023-06-21 DIAGNOSIS — C182 Malignant neoplasm of ascending colon: Secondary | ICD-10-CM

## 2023-06-22 ENCOUNTER — Inpatient Hospital Stay (HOSPITAL_BASED_OUTPATIENT_CLINIC_OR_DEPARTMENT_OTHER): Admitting: Hematology

## 2023-06-22 ENCOUNTER — Encounter: Payer: Self-pay | Admitting: Hematology

## 2023-06-22 ENCOUNTER — Inpatient Hospital Stay: Attending: Hematology

## 2023-06-22 VITALS — BP 124/73 | HR 72 | Temp 97.6°F | Resp 18 | Wt 128.2 lb

## 2023-06-22 DIAGNOSIS — C182 Malignant neoplasm of ascending colon: Secondary | ICD-10-CM

## 2023-06-22 DIAGNOSIS — I10 Essential (primary) hypertension: Secondary | ICD-10-CM | POA: Diagnosis not present

## 2023-06-22 DIAGNOSIS — Z85038 Personal history of other malignant neoplasm of large intestine: Secondary | ICD-10-CM | POA: Insufficient documentation

## 2023-06-22 DIAGNOSIS — M5430 Sciatica, unspecified side: Secondary | ICD-10-CM | POA: Diagnosis not present

## 2023-06-22 LAB — CBC WITH DIFFERENTIAL (CANCER CENTER ONLY)
Abs Immature Granulocytes: 0.02 10*3/uL (ref 0.00–0.07)
Basophils Absolute: 0 10*3/uL (ref 0.0–0.1)
Basophils Relative: 1 %
Eosinophils Absolute: 0 10*3/uL (ref 0.0–0.5)
Eosinophils Relative: 1 %
HCT: 39 % (ref 36.0–46.0)
Hemoglobin: 12.7 g/dL (ref 12.0–15.0)
Immature Granulocytes: 0 %
Lymphocytes Relative: 26 %
Lymphs Abs: 1.3 10*3/uL (ref 0.7–4.0)
MCH: 30.8 pg (ref 26.0–34.0)
MCHC: 32.6 g/dL (ref 30.0–36.0)
MCV: 94.4 fL (ref 80.0–100.0)
Monocytes Absolute: 0.5 10*3/uL (ref 0.1–1.0)
Monocytes Relative: 9 %
Neutro Abs: 3.2 10*3/uL (ref 1.7–7.7)
Neutrophils Relative %: 63 %
Platelet Count: 254 10*3/uL (ref 150–400)
RBC: 4.13 MIL/uL (ref 3.87–5.11)
RDW: 13.1 % (ref 11.5–15.5)
WBC Count: 5.1 10*3/uL (ref 4.0–10.5)
nRBC: 0 % (ref 0.0–0.2)

## 2023-06-22 LAB — CMP (CANCER CENTER ONLY)
ALT: 20 U/L (ref 0–44)
AST: 23 U/L (ref 15–41)
Albumin: 4.3 g/dL (ref 3.5–5.0)
Alkaline Phosphatase: 64 U/L (ref 38–126)
Anion gap: 4 — ABNORMAL LOW (ref 5–15)
BUN: 26 mg/dL — ABNORMAL HIGH (ref 8–23)
CO2: 31 mmol/L (ref 22–32)
Calcium: 9.5 mg/dL (ref 8.9–10.3)
Chloride: 104 mmol/L (ref 98–111)
Creatinine: 0.83 mg/dL (ref 0.44–1.00)
GFR, Estimated: >60 mL/min (ref >60)
Glucose, Bld: 93 mg/dL (ref 70–99)
Potassium: 4.6 mmol/L (ref 3.5–5.1)
Sodium: 139 mmol/L (ref 135–145)
Total Bilirubin: 0.5 mg/dL (ref 0.0–1.2)
Total Protein: 6.7 g/dL (ref 6.5–8.1)

## 2023-06-22 LAB — FERRITIN: Ferritin: 38 ng/mL (ref 11–307)

## 2023-06-22 LAB — CEA (ACCESS): CEA (CHCC): 1.53 ng/mL (ref 0.00–5.00)

## 2023-06-22 NOTE — Progress Notes (Signed)
 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 Colonoscopy on 02/24/21 showed a partially obstructing tumor in ascending colon. Pathology showed adenocarcinoma. Staging chest CT on 03/05/21 was negative. -right hemi-colectomy on 04/05/21 with Dr. Cliffton Asters showed 4.7 cm invasive moderately differentiated adenocarcinoma. Margins were negative, one lymph node was positive (1/36). -given the positive lymph node, she completed adjuvant Xeloda 05/03/21 - 10/17/21. -surveillance CT CAP 11/11/21 showed: no convincing evidence of metastatic disease; clustered nodularity in RLL favored infectious/inflammatory.  -Repeated CT chest 01/2022 showed resolved infiltrates in the right lung, no evidence of recurrence. -Surveillance CT of abdomen pelvis in March 2024 was negative for recurrence.    Assessment and Plan    Colon Cancer Over two years post-diagnosis of colon cancer with a significantly reduced 30-year risk, now estimated at 10-15%. Previous colonoscopy revealed two benign precancerous polyps. She is currently asymptomatic with normal blood count, kidney, and liver functions. Tumor markers are pending, and CEA levels are not always reliable due to potential elevation from benign conditions. - Schedule a CT scan in October - Continue follow-up every six months with lab work and physical exam - Monitor tumor markers when available  Sciatic Pain Experienced severe sciatic pain a month ago, requiring emergency intervention. MRI indicated a bulging disc affecting a nerve. Treated with pain medication,  heating pad, ice, and currently on gabapentin. Condition has improved significantly, allowing regular walking and volunteering. - Continue gabapentin as needed for pain management - Encourage regular physical activity as tolerated  Hypertension Blood pressure has improved significantly to 123/72 mmHg. Monitoring blood pressure and cholesterol levels, which were previously elevated. She is taking vitamin K and has adjusted lifestyle habits, including reduced alcohol consumption. - Continue monitoring blood pressure and cholesterol levels - Maintain current lifestyle modifications  Foot Condition Has a foot condition with a large knot, scheduled for a procedure with Dr. Victorino Dike in mid-September. - Proceed with scheduled foot procedure in September      Plan -She is clinically doing well exam was unremarkable, lab reviewed, no concerns. -Continue cancer surveillance.  Follow-up in 6 months with lab and CT scan 1 week before   SUMMARY OF ONCOLOGIC HISTORY: Oncology History Overview Note   Cancer Staging  Cancer of right colon [facility]) Staging form: Colon and Rectum, AJCC 8th Edition - Pathologic stage from 04/05/2021: Stage IIIB (pT3, pN1a, cM0) - Signed by Malachy Mood, MD on 04/23/2021    Cancer of right colon [facility])  02/08/2021 Imaging   CLINICAL DATA:  Right lower quadrant abdominal pain. Nausea and fever.   EXAM: CT ABDOMEN AND PELVIS WITH CONTRAST  IMPRESSION: 1. Distal ascending colon with associated pericolonic fat stranding and prominent but nonenlarged mesenteric lymph nodes. Findings suggestive of malignancy. Differential diagnosis of infection. Recommend colonoscopy after course of infection treatment to exclude malignancy. 2. Tiny hiatal hernia. 3. Nonobstructive punctate left nephrolithiasis.   02/24/2021 Procedure   Colonoscopy, Dr. Lavon Paganini  Impression: - Rule out malignancy, polypoid lesion in the cecum. Biopsied. - Likely malignant partially obstructing tumor in the  ascending colon. Biopsied. Tattooed. - Diverticulosis in the sigmoid  colon. - Non-bleeding external and internal hemorrhoids.   02/24/2021 Initial Biopsy   Diagnosis 1. Cecum Biopsy, Mass - FRAGMENTS OF TUBULOVILLOUS ADENOMA. - NO HIGH GRADE DYSPLASIA OR INVASIVE CARCINOMA. 2. Ascending Colon Biopsy, Mass - ADENOCARCINOMA.   03/05/2021 Imaging   EXAM: CT CHEST WITH CONTRAST  IMPRESSION: No evidence of metastatic disease in the chest.   Heterogeneous, enlarged thyroid with multiple nodules measuring up to 2.2 cm. Recommend thyroid US (ref: J Am Coll Radiol. 2015 Feb;12(2): 143-50).   Coronary artery disease.   Aortic Atherosclerosis (ICD10-I70.0).   04/05/2021 Cancer Staging   Staging form: Colon and Rectum, AJCC 8th Edition - Pathologic stage from 04/05/2021: Stage IIIB (pT3, pN1a, cM0) - Signed by Malachy Mood, MD on 04/23/2021 Total positive nodes: 1 Histologic grading system: 4 grade system Histologic grade (G): G2 Residual tumor (R): R0 - None   04/05/2021 Definitive Surgery   FINAL MICROSCOPIC DIAGNOSIS:   A. COLON, RIGHT, RESECTION:  - Invasive moderately differentiated adenocarcinoma.  - Separate tubulovillous adenoma.  - All surgical margins negative for dysplasia and malignancy.  - One of thirty-six lymph nodes involved by metastatic adenocarcinoma (1/36).  - See Oncology Table.   ADDENDUM:  Mismatch Repair Protein (I[facility])  SUMMARY INTERPRETATION: NORMAL    04/23/2021 Initial Diagnosis   Cancer of right colon [facility])      Discussed the use of AI scribe software for clinical note transcription with the patient, who gave verbal consent to proceed.  History of Present Illness   The patient, with a history of colon cancer and a recent hospitalization for a bulging disc, presents for a routine follow-up. She reports no new symptoms and overall feels well. She denies any abdominal pain, nausea, or changes in bowel movements.  She was recently hospitalized for severe  pain due to a bulging disc impinging on a nerve. The pain was so severe that she was taken to the emergency room via ambulance. After receiving treatment, she spent two weeks on a heating pad and ice, which helped alleviate the pain. She continues to take gabapentin for residual pain, but reports significant improvement.  The patient is active, volunteering at the hospital and walking approximately five miles daily. She also reports a recent wellness check with her primary care provider, Dr. Hal Hope, and is awaiting the results of blood work. She has been managing her blood pressure with medication and reports recent readings have been within normal limits.  The patient is two and a half years post-diagnosis of colon cancer. She had a colonoscopy after surgery that revealed two precancerous polyps. She is unsure of when her next colonoscopy is due.         All other systems were reviewed with the patient and are negative.  MEDICAL HISTORY:  Past Medical History:  Diagnosis Date   Allergy    Anemia    Anxiety    Arthritis    Cataract    Colon cancer (HCC)    Depression    Hyperlipidemia    Hypertension    Insomnia     SURGICAL HISTORY: Past Surgical History:  Procedure Laterality Date   ABDOMINAL HYSTERECTOMY  03/22/1983   CATARACT EXTRACTION W/ INTRAOCULAR LENS IMPLANT Left    COLONOSCOPY     COSMETIC SURGERY  03/21/1997   face lift   DILATION AND CURETTAGE OF UTERUS     LAPAROSCOPIC RIGHT HEMI COLECTOMY Right 04/05/2021   Procedure: LAPAROSCOPIC RIGHT HEMI COLECTOMY;  Surgeon: Andria Meuse, MD;  Location: [facility];  Service: General;  Laterality: Right;   ORIF WRIST FRACTURE Left 01/01/2013   Procedure: OPEN REDUCTION INTERNAL FIXATION (ORIF)OF LEFT DISTAL RADIUS FRACTURE;  Surgeon: Wyn Forster., MD;  Location: [facility];  Service: Orthopedics;  Laterality: Left;   REFRACTIVE SURGERY     TONSILLECTOMY      I have reviewed the social history and  family history with the patient and they are unchanged from previous note.  ALLERGIES:  has no known allergies.  MEDICATIONS:  Current Outpatient Medications  Medication Sig Dispense Refill   Ascorbic Acid (VITAMIN C) 1000 MG tablet Take 1,000 mg by mouth daily. (Patient not taking: Reported on 12/19/2022)     Calcium Carb-Cholecalciferol (CALCIUM 500 + D PO) Take 2 tablets by mouth daily.     cyclobenzaprine (FLEXERIL) 5 MG tablet Take 1 tablet (5 mg total) by mouth 2 (two) times daily as needed for muscle spasms. 20 tablet 0   lamoTRIgine (LAMICTAL) 200 MG tablet Take 200 mg by mouth daily.     lidocaine (LIDODERM) 5 % Place 1 patch onto the skin daily. Remove & Discard patch within 12 hours or as directed by MD 15 patch 0   Magnesium 400 MG TABS Take 400 mg by mouth daily.     Melatonin 10 MG TABS Take 10 mg by mouth at bedtime as needed (sleep). (Patient not taking: Reported on 12/19/2022)     methylPREDNISolone (MEDROL DOSEPAK) 4 MG TBPK tablet Please follow instructions on Dosepak 21 each 0   Multiple Vitamins-Minerals (HAIR SKIN AND NAILS FORMULA) TABS Take 3 tablets by mouth daily. (Patient not taking: Reported on 12/19/2022)     Multiple Vitamins-Minerals (MULTIVITAMIN ADULT) CHEW Chew 1 tablet by mouth daily.     olmesartan (BENICAR) 20 MG tablet Take 20 mg by mouth daily.     OVER THE COUNTER MEDICATION Vitamin D 3 one capsule daily. (Patient not taking: Reported on 12/19/2022)     oxyCODONE-acetaminophen (PERCOCET/ROXICET) 5-325 MG tablet Take 1 tablet by mouth every 6 (six) hours as needed for severe pain (pain score 7-10). 15 tablet 0   phytonadione (VITAMIN K) 5 MG tablet Take 5 mg by mouth daily.     spironolactone (ALDACTONE) 25 MG tablet Take 25 mg by mouth daily.     Zinc 50 MG TABS Take 50 mg by mouth daily.     No current facility-administered medications for this visit.    PHYSICAL EXAMINATION: ECOG PERFORMANCE STATUS: 0 - Asymptomatic  Vitals:   06/22/23 1133  BP:  124/73  Pulse: 72  Resp: 18  Temp: 97.6 F (36.4 C)  SpO2: 100%   Wt Readings from Last 3 Encounters:  06/22/23 128 lb 3.2 oz (58.2 kg)  12/19/22 126 lb 6.4 oz (57.3 kg)  08/25/22 120 lb 6.4 oz (54.6 kg)     GENERAL:alert, no distress and comfortable SKIN: skin color, texture, turgor are normal, no rashes or significant lesions EYES: normal, Conjunctiva are pink and non-injected, sclera clear NECK: supple, thyroid normal size, non-tender, without nodularity LYMPH:  no palpable lymphadenopathy in the cervical, axillary  LUNGS: clear to auscultation and percussion with normal breathing effort HEART: regular rate & rhythm and no murmurs and no lower extremity edema ABDOMEN:abdomen soft, non-tender and normal bowel sounds Musculoskeletal:no cyanosis of digits and no clubbing  NEURO: alert & oriented x 3 with fluent speech, no focal motor/sensory deficits    LABORATORY DATA:  I have reviewed the data as listed    Latest Ref  Rng & Units 06/22/2023   11:12 AM 12/19/2022   11:23 AM 08/25/2022   11:16 AM  CBC  WBC 4.0 - 10.5 K/uL 5.1  5.3  3.9   Hemoglobin 12.0 - 15.0 g/dL 62.1  30.8  65.7   Hematocrit 36.0 - 46.0 % 39.0  39.3  40.7   Platelets 150 - 400 K/uL 254  228  244         Latest Ref Rng & Units 06/22/2023   11:12 AM 12/19/2022   11:23 AM 08/25/2022   11:16 AM  CMP  Glucose 70 - 99 mg/dL 93  99  91   BUN 8 - 23 mg/dL 26  22  17    Creatinine 0.44 - 1.00 mg/dL 8.46  9.62  9.52   Sodium 135 - 145 mmol/L 139  141  141   Potassium 3.5 - 5.1 mmol/L 4.6  4.2  4.6   Chloride 98 - 111 mmol/L 104  106  104   CO2 22 - 32 mmol/L 31  28  32   Calcium 8.9 - 10.3 mg/dL 9.5  84.1  9.7   Total Protein 6.5 - 8.1 g/dL 6.7  7.0  7.0   Total Bilirubin 0.0 - 1.2 mg/dL 0.5  0.6  0.6   Alkaline Phos 38 - 126 U/L 64  65  76   AST 15 - 41 U/L 23  21  23    ALT 0 - 44 U/L 20  14  16        RADIOGRAPHIC STUDIES: I have personally reviewed the radiological images as listed and agreed with the  findings in the report. No results found.    Orders Placed This Encounter  Procedures   CT CHEST ABDOMEN PELVIS W CONTRAST    Standing Status:   Future    Expected Date:   12/18/2023    Expiration Date:   06/21/2024    If indicated for the ordered procedure, I authorize the administration of contrast media per Radiology protocol:   Yes    Does the patient have a contrast media/X-ray dye allergy?:   No    Preferred imaging location?:   [facility]    If indicated for the ordered procedure, I authorize the administration of oral contrast media per Radiology protocol:   Yes   All questions were answered. The patient knows to call the clinic with any problems, questions or concerns. No barriers to learning was detected. The total time spent in the appointment was 25 minutes.     Malachy Mood, MD 06/22/2023

## 2023-06-22 NOTE — Assessment & Plan Note (Signed)
Stage IIIB p(T3, N1aM0), MSI-S -presented to ED with RLQ pain on 02/08/21. Colonoscopy on 02/24/21 showed a partially obstructing tumor in ascending colon. Pathology showed adenocarcinoma. Staging chest CT on 03/05/21 was negative. -right hemi-colectomy on 04/05/21 with Dr. Cliffton Asters showed 4.7 cm invasive moderately differentiated adenocarcinoma. Margins were negative, one lymph node was positive (1/36). -given the positive lymph node, she completed adjuvant Xeloda 05/03/21 - 10/17/21. -surveillance CT CAP 11/11/21 showed: no convincing evidence of metastatic disease; clustered nodularity in RLL favored infectious/inflammatory.  -Repeated CT chest 01/2022 showed resolved infiltrates in the right lung, no evidence of recurrence. -Surveillance CT of abdomen pelvis in March 2024 was negative for recurrence.

## 2023-06-23 ENCOUNTER — Other Ambulatory Visit: Payer: Self-pay | Admitting: Family Medicine

## 2023-06-23 DIAGNOSIS — E041 Nontoxic single thyroid nodule: Secondary | ICD-10-CM

## 2023-06-26 ENCOUNTER — Other Ambulatory Visit: Payer: Self-pay

## 2023-06-26 ENCOUNTER — Telehealth: Payer: Self-pay

## 2023-06-26 ENCOUNTER — Encounter: Payer: Self-pay | Admitting: Nurse Practitioner

## 2023-06-26 NOTE — Telephone Encounter (Signed)
 Received Telephone call from the patient with a question for Lacie Burton,NP about her lab results.  Let patient know that Clayborn Heron was seeing patients, and that I would forward her message to [facility].

## 2023-07-17 ENCOUNTER — Ambulatory Visit
Admission: RE | Admit: 2023-07-17 | Discharge: 2023-07-17 | Disposition: A | Discharge status: Home / Self Care | Source: Ambulatory Visit | Attending: Family Medicine | Admitting: Family Medicine

## 2023-07-17 DIAGNOSIS — E041 Nontoxic single thyroid nodule: Secondary | ICD-10-CM

## 2023-10-10 ENCOUNTER — Other Ambulatory Visit: Payer: Self-pay | Admitting: Orthopedic Surgery

## 2023-10-11 LAB — SURGICAL PATHOLOGY

## 2023-11-07 IMAGING — CT CT ABD-PELV W/ CM
2 of 5 series · 16 of 46 positions shown, 18 images · IV contrast (Omni 300)
Comparison: None.

CLINICAL DATA: Right lower quadrant abdominal pain. Nausea and
fever.

EXAM:
CT ABDOMEN AND PELVIS WITH CONTRAST
TECHNIQUE: Multidetector CT imaging of the abdomen and pelvis was performed
using the standard protocol following bolus administration of
intravenous contrast.
CONTRAST:  100mL OMNIPAQUE IOHEXOL 300 MG/ML  SOLN

[Series 3: a/p w/ 5mm · axial · 0.82mm/px · z∈[+712,+1087]mm · 13 of 85 slices shown, 15 images]
[im 5/85  soft-tissue]
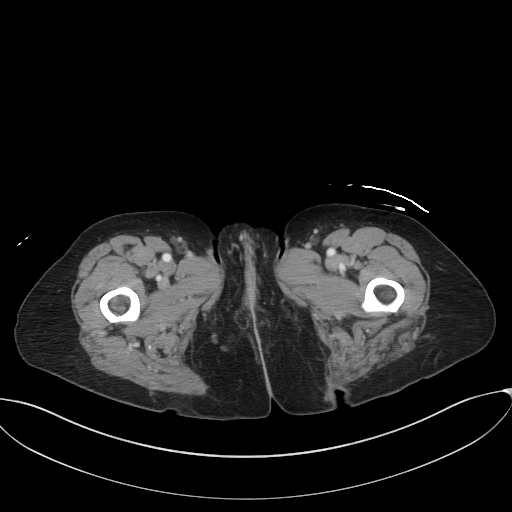
[im 5/85  bone]
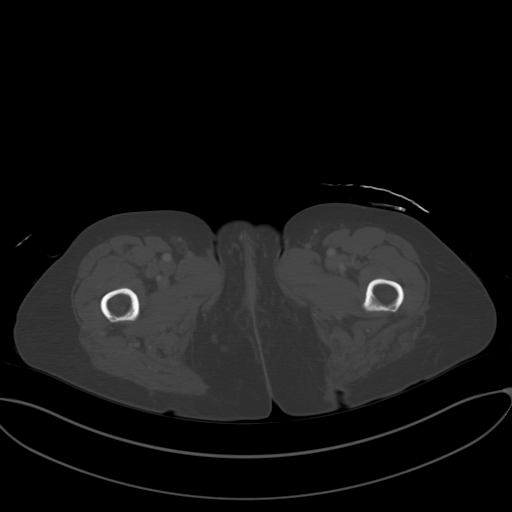
[im 13/85  soft-tissue]
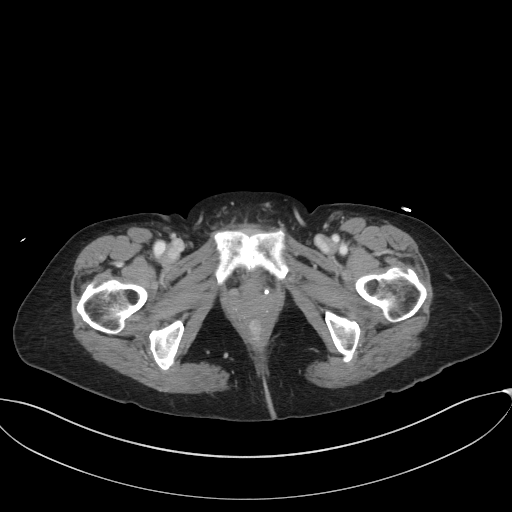
[im 17/85  soft-tissue]
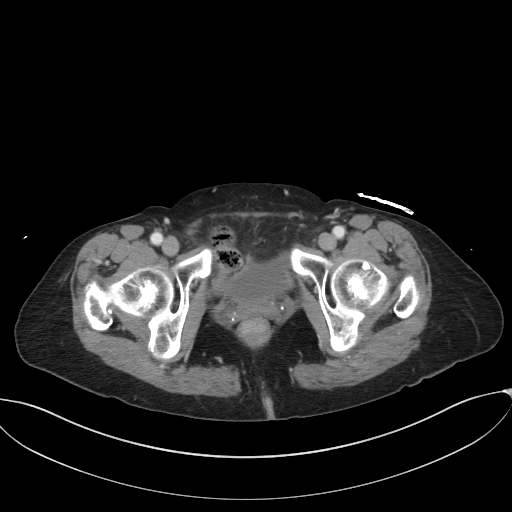
[im 26/85  soft-tissue]
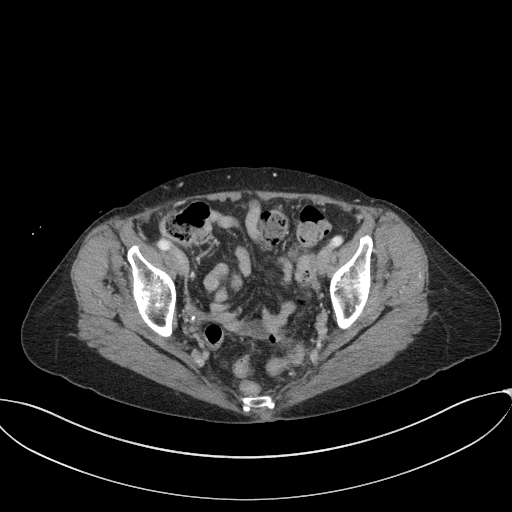
[im 30/85  soft-tissue]
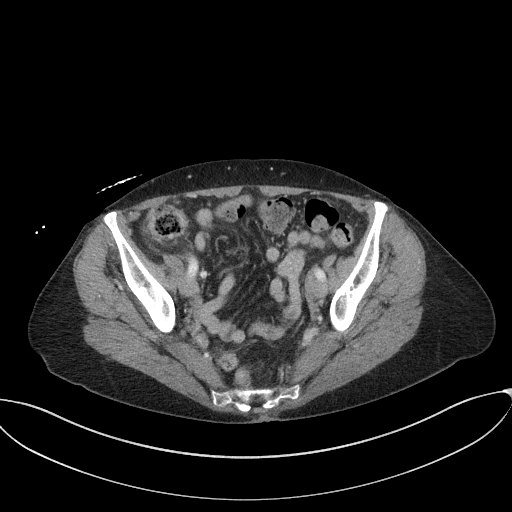
[im 38/85  soft-tissue]
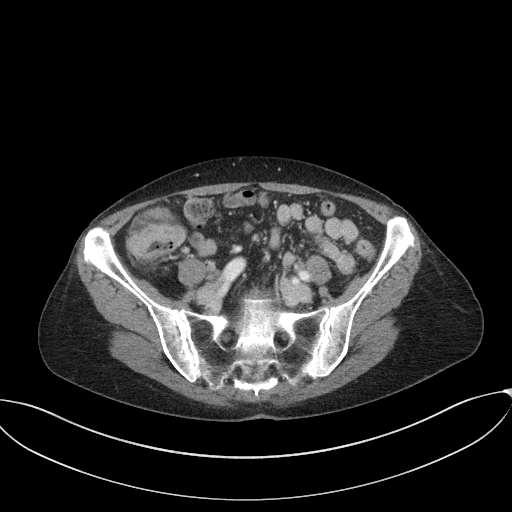
[im 43/85  soft-tissue]
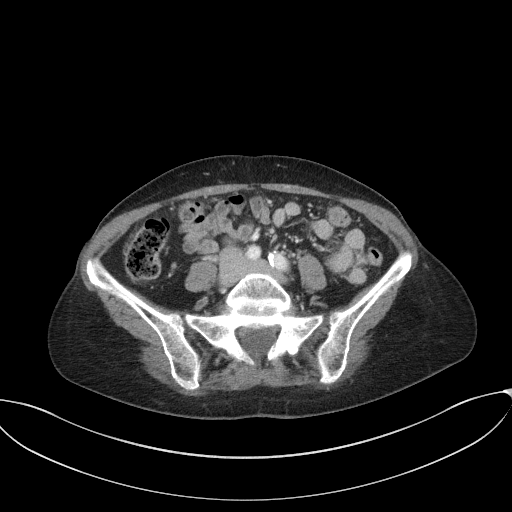
[im 47/85  soft-tissue]
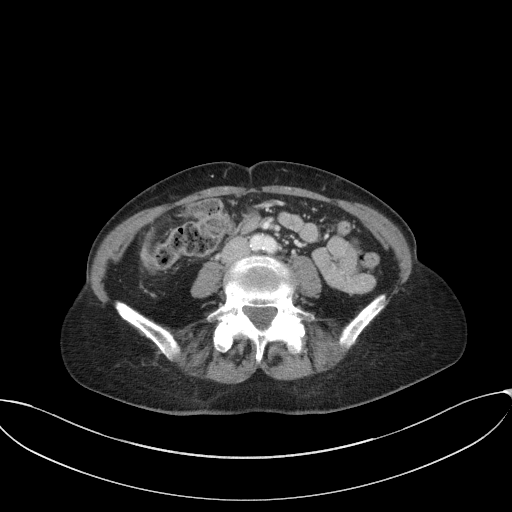
[im 55/85  soft-tissue]
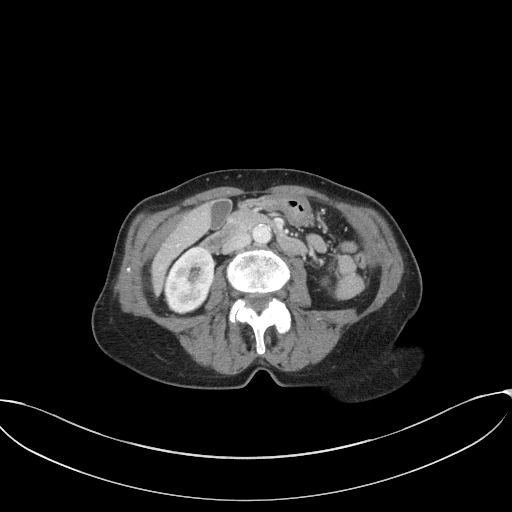
[im 55/85  bone]
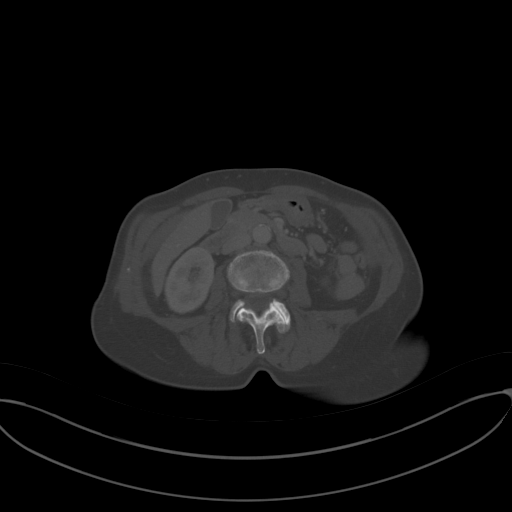
[im 59/85  soft-tissue]
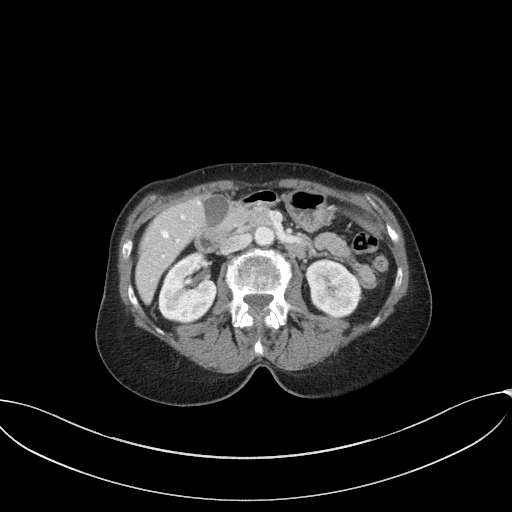
[im 68/85  soft-tissue]
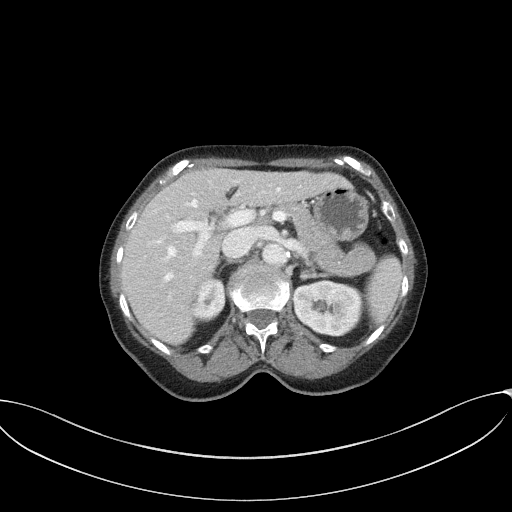
[im 72/85  soft-tissue]
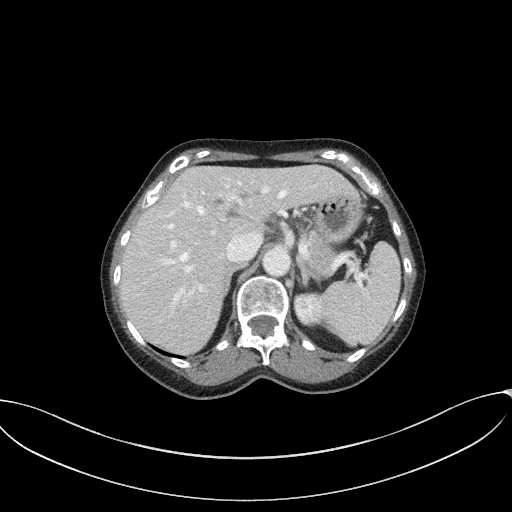
[im 80/85  soft-tissue]
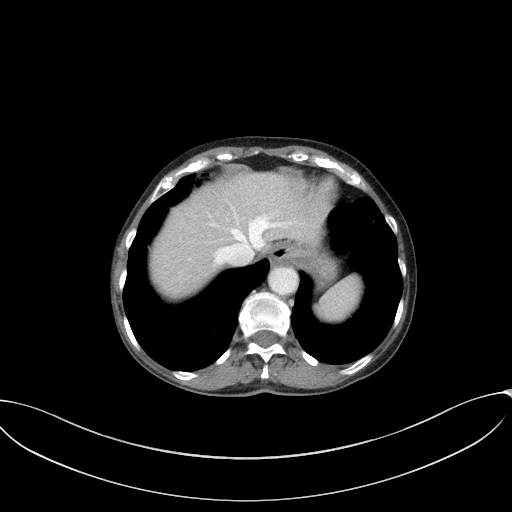

[Series 7: a/p w/ cor · coronal · 0.82mm/px · 3 of 118 slices shown]
[im 40/118  soft-tissue]
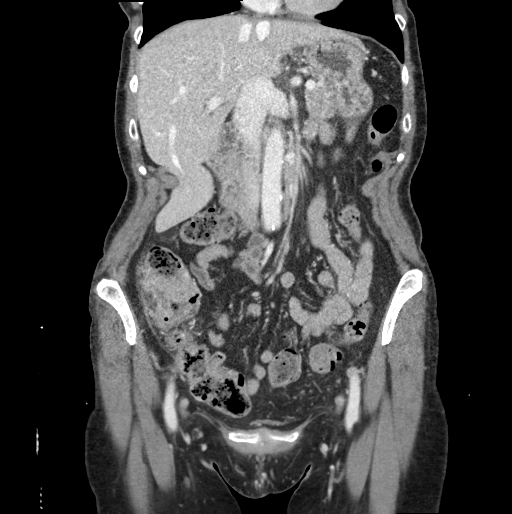
[im 53/118  soft-tissue]
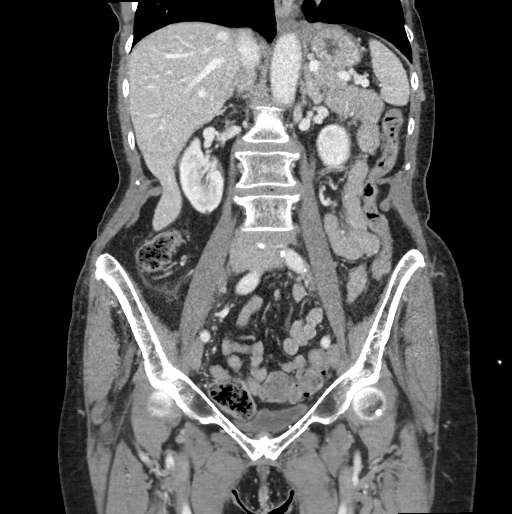
[im 66/118  soft-tissue]
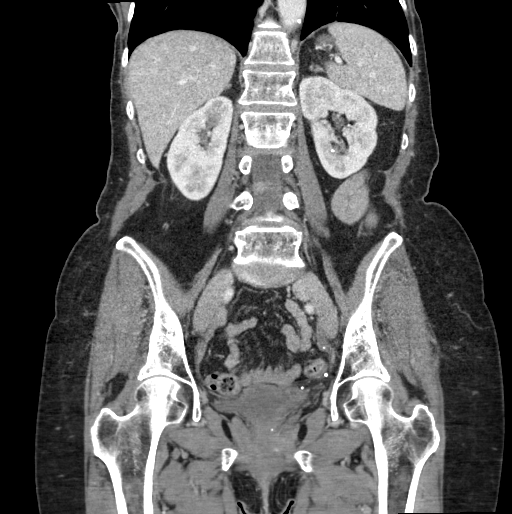

[16 of 46 positions shown; findings below may reference images not displayed]

FINDINGS: Lower chest: No acute abnormality.  Tiny hiatal hernia.

Hepatobiliary: No focal liver abnormality. No gallstones,
gallbladder wall thickening, or pericholecystic fluid. No biliary
dilatation.

Pancreas: No focal lesion. Normal pancreatic contour. No surrounding
inflammatory changes. No main pancreatic ductal dilatation.

Spleen: Normal in size without focal abnormality.

Adrenals/Urinary Tract:

No adrenal nodule bilaterally.

Bilateral kidneys enhance symmetrically. 2 mm calcified stone within
the left kidney. Subcentimeter hypodensities too small characterize.

No hydronephrosis. No hydroureter.

The urinary bladder is unremarkable.

On delayed imaging, there is no urothelial wall thickening and there
are no filling defects in the opacified portions of the bilateral
collecting systems or ureters.

Stomach/Bowel: Stomach is within normal limits. No evidence of small
bowel wall thickening or dilatation. Irregular circumferential bowel
wall thickening of the distal ascending colon ([DATE]). Associated
trace pericolonic fat stranding. Appendix appears normal.

Vascular/Lymphatic: No abdominal aorta or iliac aneurysm. Mild
atherosclerotic plaque of the aorta and its branches. Prominent but
nonenlarged right pericolic lymph nodes. No abdominal, pelvic, or
inguinal lymphadenopathy.

Reproductive: Status post hysterectomy. No adnexal masses.

Other: No intraperitoneal free fluid. No intraperitoneal free gas.
No organized fluid collection.

Musculoskeletal:

No abdominal wall hernia or abnormality.

No suspicious lytic or blastic osseous lesions. No acute displaced
fracture. Grade 1 anterolisthesis of L4 on L5.
IMPRESSION: 1. Distal ascending colon with associated pericolonic fat stranding
and prominent but nonenlarged mesenteric lymph nodes. Findings
suggestive of malignancy. Differential diagnosis of infection.
Recommend colonoscopy after course of infection treatment to exclude
malignancy.
2. Tiny hiatal hernia.
3. Nonobstructive punctate left nephrolithiasis.

## 2023-12-02 IMAGING — CT CT CHEST W/ CM
2 of 4 series · 15 of 36 positions shown, 18 images · IV contrast (omnipaque)
Comparison: None.

CLINICAL DATA: Abdominal mass, intra-abdominal neoplasm suspected
abnormal abd/pelvis CT-partially obstructing tumor ascending
colon-polypoid lesion in the cecum

EXAM:
CT CHEST WITH CONTRAST
TECHNIQUE: Multidetector CT imaging of the chest was performed during
intravenous contrast administration.
CONTRAST:  75mL OMNIPAQUE IOHEXOL 300 MG/ML  SOLN

[Series 2: routine chest with · axial · 0.59mm/px · z∈[-203,+89]mm · 12 of 172 slices shown, 15 images]
[im 13/172  mediastinal]
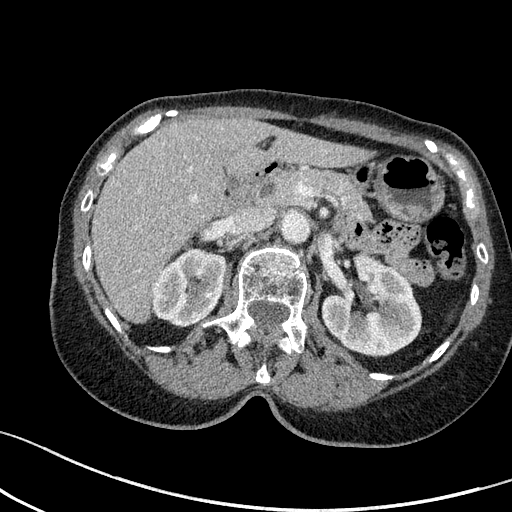
[im 13/172  lung]
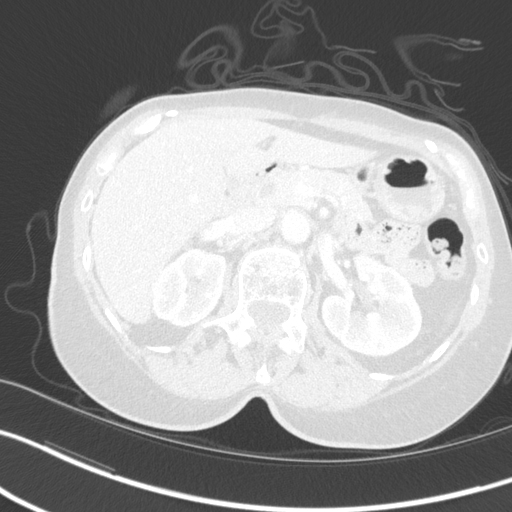
[im 25/172  lung]
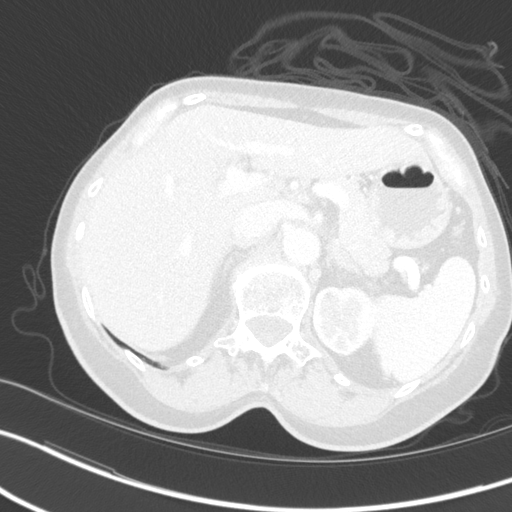
[im 37/172  lung]
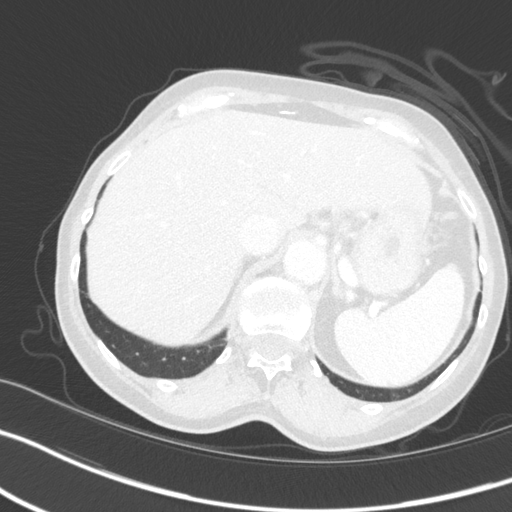
[im 49/172  lung]
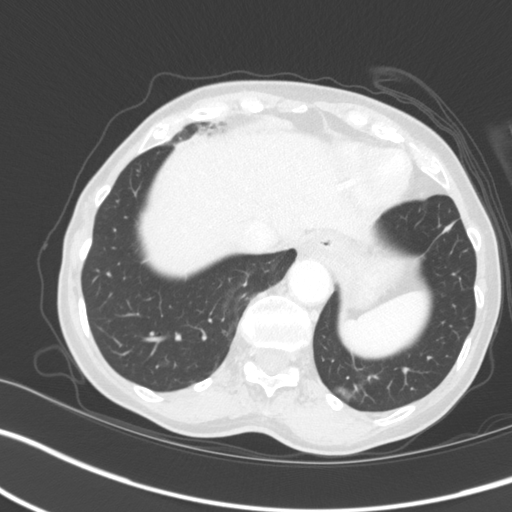
[im 62/172  mediastinal]
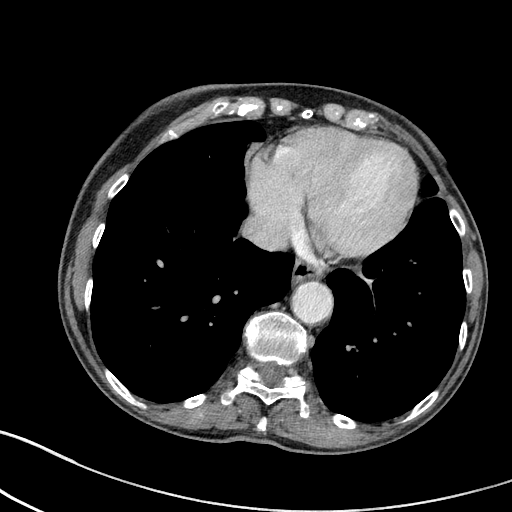
[im 62/172  lung]
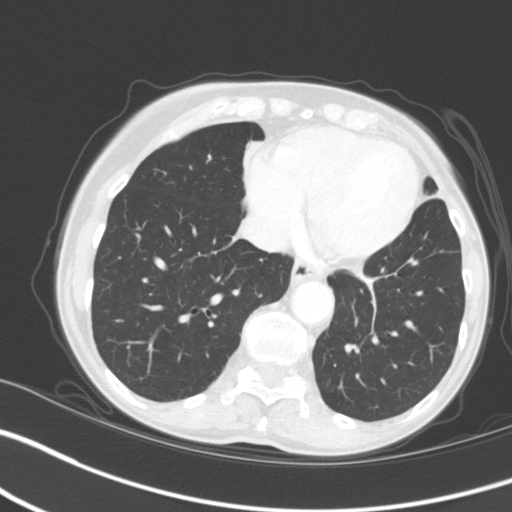
[im 74/172  lung]
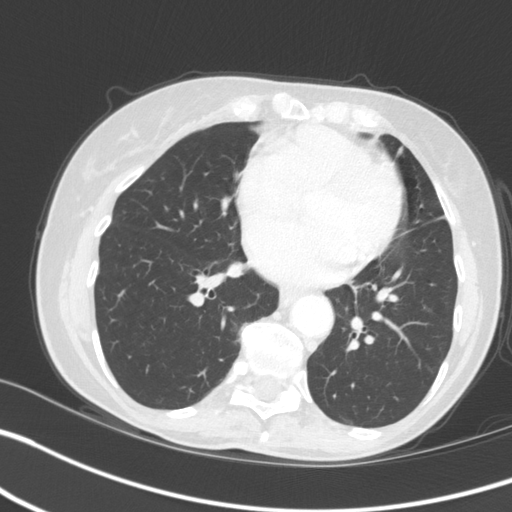
[im 98/172  lung]
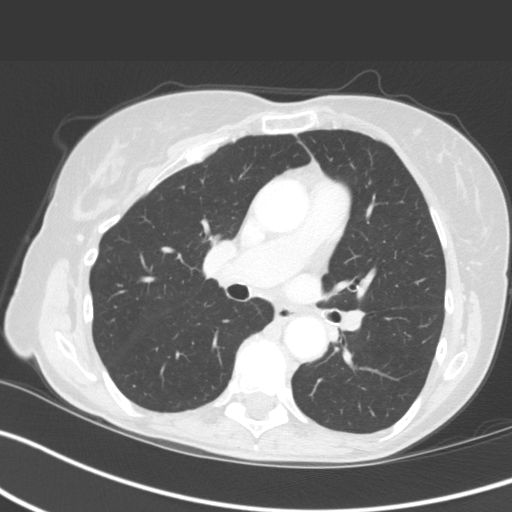
[im 110/172  lung]
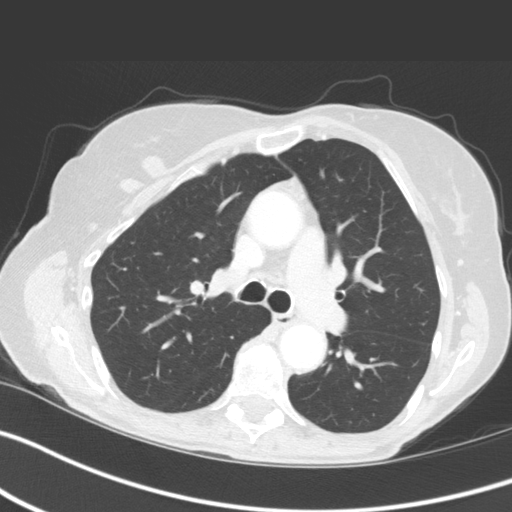
[im 123/172  mediastinal]
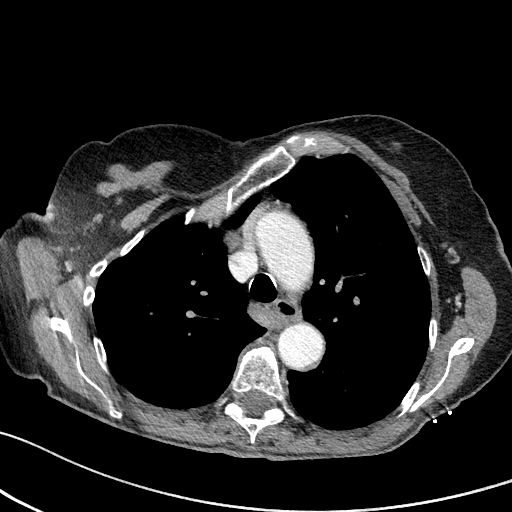
[im 123/172  lung]
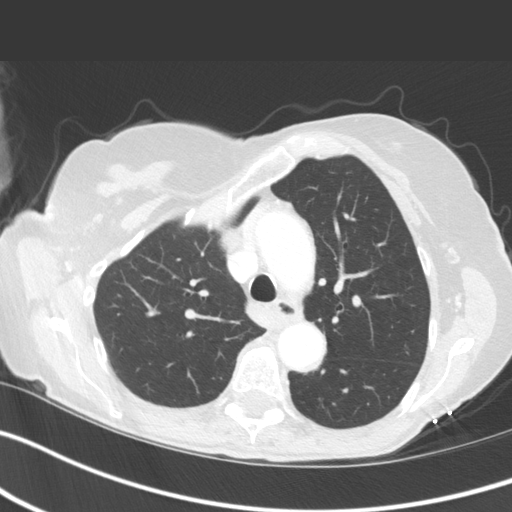
[im 135/172  lung]
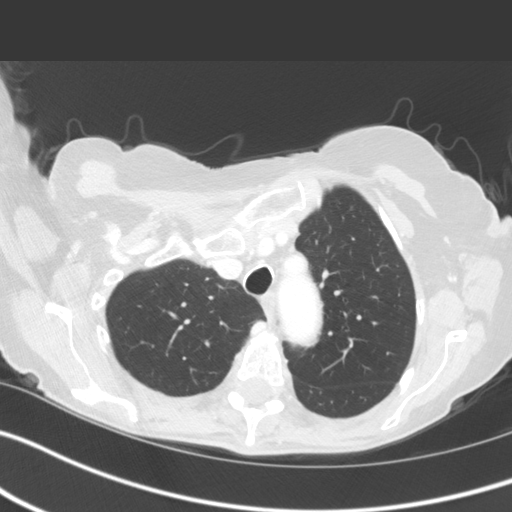
[im 147/172  lung]
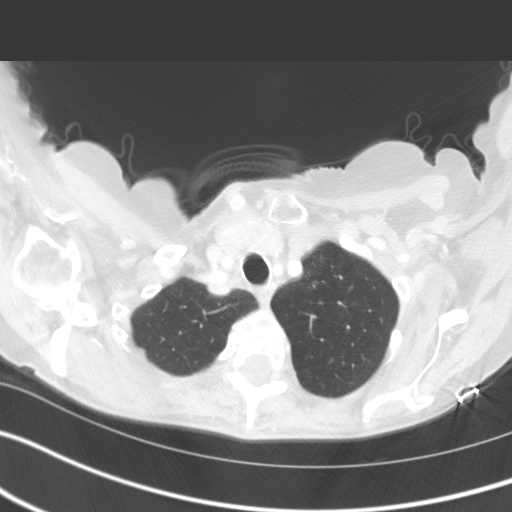
[im 159/172  lung]
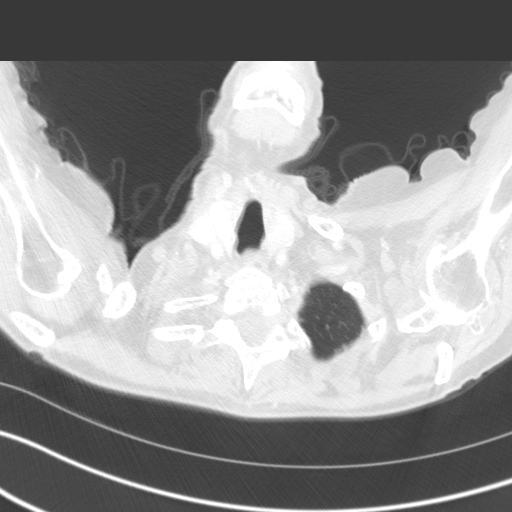

[Series 5: coronal · coronal · 0.74mm/px · 3 of 114 slices shown]
[im 23/114  lung]
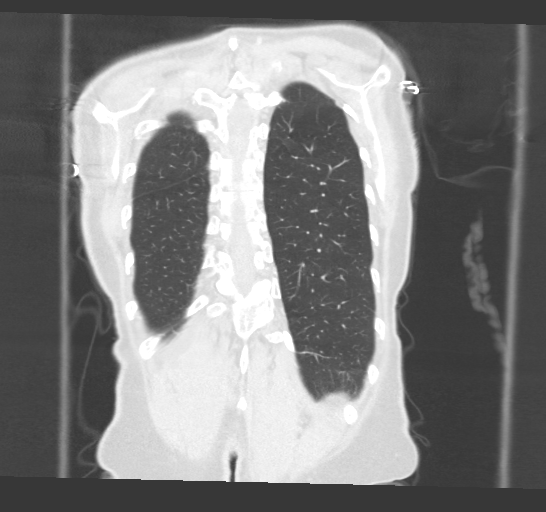
[im 46/114  lung]
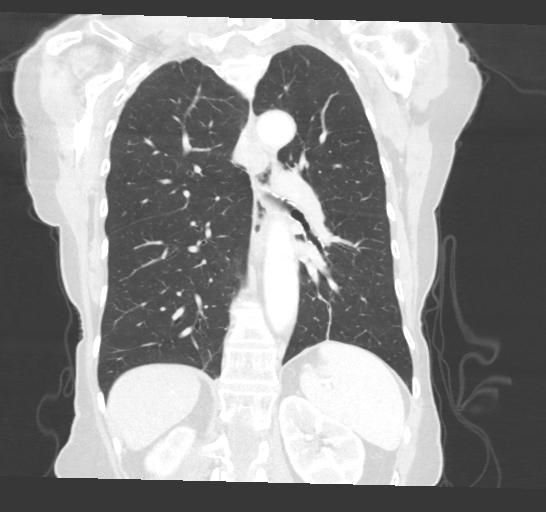
[im 68/114  lung]
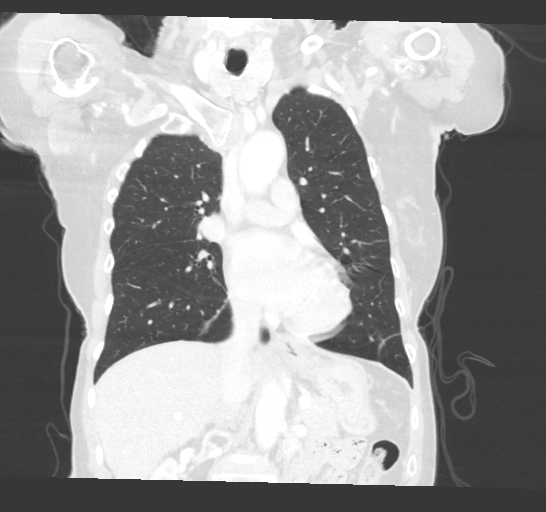

[15 of 36 positions shown; findings below may reference images not displayed]

FINDINGS: Cardiovascular: Heart is normal size. Aorta is normal caliber.
Scattered coronary artery and aortic calcifications.

Mediastinum/Nodes: No mediastinal, hilar, or axillary adenopathy.
Trachea and esophagus are unremarkable. Thyroid is enlarged,
heterogeneous. Likely multiple focal nodules. Solid-appearing nodule
in the inferior isthmus measures up to 2.2 cm.

Lungs/Pleura: Linear scarring in the lung bases. No suspicious
pulmonary nodules or effusions.

Upper Abdomen: Imaging into the upper abdomen demonstrates no acute
findings.

Musculoskeletal: Chest wall soft tissues are unremarkable. No acute
bony abnormality.
IMPRESSION: No evidence of metastatic disease in the chest.

Heterogeneous, enlarged thyroid with multiple nodules measuring up
to 2.2 cm. Recommend thyroid US (ref: [HOSPITAL]. [DATE]): 143-50).

Coronary artery disease.

Aortic Atherosclerosis ([id number]-DC0.0).

## 2023-12-18 ENCOUNTER — Ambulatory Visit (HOSPITAL_COMMUNITY)
Admission: RE | Admit: 2023-12-18 | Discharge: 2023-12-18 | Disposition: A | Discharge status: Home / Self Care | Source: Ambulatory Visit | Attending: Hematology | Admitting: Hematology

## 2023-12-18 DIAGNOSIS — C182 Malignant neoplasm of ascending colon: Secondary | ICD-10-CM | POA: Insufficient documentation

## 2023-12-18 MED ORDER — IOHEXOL 9 MG/ML PO SOLN
500.0000 mL | ORAL | Status: AC
  Administered 2023-12-18 (×2): 500 mL via ORAL

## 2023-12-18 MED ORDER — SODIUM CHLORIDE (PF) 0.9 % IJ SOLN
INTRAMUSCULAR | Status: AC
  Filled 2023-12-18: qty 50

## 2023-12-18 MED ORDER — IOHEXOL 300 MG/ML  SOLN
100.0000 mL | Freq: Once | INTRAMUSCULAR | Status: AC | PRN
  Administered 2023-12-18: 100 mL via INTRAVENOUS

## 2023-12-24 ENCOUNTER — Ambulatory Visit: Payer: Self-pay | Admitting: Hematology

## 2023-12-27 NOTE — Telephone Encounter (Addendum)
 Called patient to relay message below as per Dr. Lanny, patient voiced full understanding and had no further questions at this time.   ----- Message from Onita Lanny sent at 12/24/2023  4:15 PM EDT ----- Please let her know her CT looks good, no evidence of cancer recurrence or other concerns. OK to move her lab to OV on same day later this month, thx   Onita Lanny  ----- Message ----- From: Rebecka, Rad Results In Sent: 12/18/2023   4:50 PM EDT To: Onita Lanny, MD

## 2024-01-05 ENCOUNTER — Other Ambulatory Visit: Payer: Self-pay

## 2024-01-05 DIAGNOSIS — C182 Malignant neoplasm of ascending colon: Secondary | ICD-10-CM

## 2024-01-08 ENCOUNTER — Inpatient Hospital Stay: Attending: Hematology

## 2024-01-08 ENCOUNTER — Telehealth: Payer: Self-pay

## 2024-01-08 ENCOUNTER — Other Ambulatory Visit: Payer: Self-pay

## 2024-01-08 DIAGNOSIS — Z862 Personal history of diseases of the blood and blood-forming organs and certain disorders involving the immune mechanism: Secondary | ICD-10-CM | POA: Insufficient documentation

## 2024-01-08 DIAGNOSIS — Z9221 Personal history of antineoplastic chemotherapy: Secondary | ICD-10-CM | POA: Diagnosis not present

## 2024-01-08 DIAGNOSIS — C182 Malignant neoplasm of ascending colon: Secondary | ICD-10-CM

## 2024-01-08 DIAGNOSIS — N200 Calculus of kidney: Secondary | ICD-10-CM | POA: Diagnosis not present

## 2024-01-08 DIAGNOSIS — Z85038 Personal history of other malignant neoplasm of large intestine: Secondary | ICD-10-CM | POA: Insufficient documentation

## 2024-01-08 LAB — CBC WITH DIFFERENTIAL (CANCER CENTER ONLY)
Abs Immature Granulocytes: 0 K/uL (ref 0.00–0.07)
Basophils Absolute: 0.1 K/uL (ref 0.0–0.1)
Basophils Relative: 2 %
Eosinophils Absolute: 0.1 K/uL (ref 0.0–0.5)
Eosinophils Relative: 2 %
HCT: 43.2 % (ref 36.0–46.0)
Hemoglobin: 13.8 g/dL (ref 12.0–15.0)
Immature Granulocytes: 0 %
Lymphocytes Relative: 28 %
Lymphs Abs: 1.2 K/uL (ref 0.7–4.0)
MCH: 30.9 pg (ref 26.0–34.0)
MCHC: 31.9 g/dL (ref 30.0–36.0)
MCV: 96.9 fL (ref 80.0–100.0)
Monocytes Absolute: 0.4 K/uL (ref 0.1–1.0)
Monocytes Relative: 9 %
Neutro Abs: 2.5 K/uL (ref 1.7–7.7)
Neutrophils Relative %: 59 %
Platelet Count: 226 K/uL (ref 150–400)
RBC: 4.46 MIL/uL (ref 3.87–5.11)
RDW: 12.9 % (ref 11.5–15.5)
WBC Count: 4.1 K/uL (ref 4.0–10.5)
nRBC: 0 % (ref 0.0–0.2)

## 2024-01-08 LAB — CMP (CANCER CENTER ONLY)
ALT: 14 U/L (ref 0–44)
AST: 26 U/L (ref 15–41)
Albumin: 4.5 g/dL (ref 3.5–5.0)
Alkaline Phosphatase: 77 U/L (ref 38–126)
Anion gap: 4 — ABNORMAL LOW (ref 5–15)
BUN: 16 mg/dL (ref 8–23)
CO2: 30 mmol/L (ref 22–32)
Calcium: 9.9 mg/dL (ref 8.9–10.3)
Chloride: 106 mmol/L (ref 98–111)
Creatinine: 0.73 mg/dL (ref 0.44–1.00)
GFR, Estimated: >60 mL/min (ref >60)
Glucose, Bld: 86 mg/dL (ref 70–99)
Potassium: 4.5 mmol/L (ref 3.5–5.1)
Sodium: 140 mmol/L (ref 135–145)
Total Bilirubin: 0.6 mg/dL (ref 0.0–1.2)
Total Protein: 7.2 g/dL (ref 6.5–8.1)

## 2024-01-08 LAB — FERRITIN: Ferritin: 78 ng/mL (ref 11–307)

## 2024-01-08 LAB — CEA (ACCESS): CEA (CHCC): 1.7 ng/mL (ref 0.00–5.00)

## 2024-01-08 NOTE — Telephone Encounter (Signed)
 Pt called stating she had labs drawn today and wanted to know what was abnormal in her results.  Pt stated she saw the results of her labs in her MyChart.  Stated that all her labs are OK.  Stated the CEA is still processing which will results sometime this evening.  Also, stated that the Anion Gap is used to measure the acid-base balance (pH balance) in the pt's blood which is at 4 today but not concerning.  Pt was glad to hear the good news and had no further questions or concerns at this time.

## 2024-01-16 ENCOUNTER — Inpatient Hospital Stay: Admitting: Hematology

## 2024-01-16 VITALS — BP 120/80 | HR 68 | Temp 97.9°F | Resp 17 | Wt 125.5 lb

## 2024-01-16 DIAGNOSIS — C182 Malignant neoplasm of ascending colon: Secondary | ICD-10-CM | POA: Diagnosis not present

## 2024-01-16 DIAGNOSIS — Z85038 Personal history of other malignant neoplasm of large intestine: Secondary | ICD-10-CM | POA: Diagnosis not present

## 2024-01-16 NOTE — Assessment & Plan Note (Addendum)
 Stage IIIB p(T3, N1aM0), MSI-S -presented to ED with RLQ pain on 02/08/21. Colonoscopy on 02/24/21 showed a partially obstructing tumor in ascending colon. Pathology showed adenocarcinoma. Staging chest CT on 03/05/21 was negative. -right hemi-colectomy on 04/05/21 with Dr. Teresa showed 4.7 cm invasive moderately differentiated adenocarcinoma. Margins were negative, one lymph node was positive (1/36). -given the positive lymph node, she completed adjuvant Xeloda  05/03/21 - 10/17/21. -surveillance CT CAP 11/11/21 showed: no convincing evidence of metastatic disease; clustered nodularity in RLL favored infectious/inflammatory.  -Repeated CT chest 01/2022 showed resolved infiltrates in the right lung, no evidence of recurrence. -Surveillance CT in March 2024 and 12/18/2023 was negative for recurrence.

## 2024-01-16 NOTE — Progress Notes (Signed)
 [facility]   [phone] [phone]   Clinic Follow up Note   Patient Care Team: Burney Darice CROME, MD as PCP - General (Family Medicine) Lanny Callander, MD as Consulting Physician (Oncology)  Date of Service:  01/16/2024  CHIEF COMPLAINT: f/u of right colon cancer  CURRENT THERAPY:  Cancer surveillance  Oncology History   Cancer of right colon [facility]) Stage IIIB p(T3, N1aM0), MSI-S -presented to ED with RLQ pain on 02/08/21. Colonoscopy on 02/24/21 showed a partially obstructing tumor in ascending colon. Pathology showed adenocarcinoma. Staging chest CT on 03/05/21 was negative. -right hemi-colectomy on 04/05/21 with Dr. Teresa showed 4.7 cm invasive moderately differentiated adenocarcinoma. Margins were negative, one lymph node was positive (1/36). -given the positive lymph node, she completed adjuvant Xeloda  05/03/21 - 10/17/21. -surveillance CT CAP 11/11/21 showed: no convincing evidence of metastatic disease; clustered nodularity in RLL favored infectious/inflammatory.  -Repeated CT chest 01/2022 showed resolved infiltrates in the right lung, no evidence of recurrence. -Surveillance CT in March 2024 and 12/18/2023 was negative for recurrence.  Assessment & Plan Stage 3B colon cancer in remission Stage 3B colon cancer diagnosed in 2022, currently in remission. The risk of recurrence has significantly decreased after three years post-treatment, with an estimated recurrence risk now less than 10%. Recent CT scan was normal, and blood work has been stable. No current symptoms suggestive of recurrence.  I do not plan to repeat surveillance CT scan. - Continue follow-up every six months for the next two years. - Perform routine lab work at follow-up visits. - Monitor for any new symptoms, especially in the abdomen or pelvis, and report if she occurs.  Left kidney stone Known left kidney stone, occasionally causing pain. No current symptoms of concern such as  hematuria or severe pain. - Monitor for symptoms such as pain or hematuria. - Advise to report any new or worsening symptoms.  Plan - She is clinically doing very well, lab and recent CT scan reviewed, no evidence of recurrence - Continue cancer surveillance, will see her back in 6 months with lab.   SUMMARY OF ONCOLOGIC HISTORY: Oncology History Overview Note   Cancer Staging  Cancer of right colon [facility]) Staging form: Colon and Rectum, AJCC 8th Edition - Pathologic stage from 04/05/2021: Stage IIIB (pT3, pN1a, cM0) - Signed by Lanny Callander, MD on 04/23/2021    Cancer of right colon [facility])  02/08/2021 Imaging   CLINICAL DATA:  Right lower quadrant abdominal pain. Nausea and fever.   EXAM: CT ABDOMEN AND PELVIS WITH CONTRAST  IMPRESSION: 1. Distal ascending colon with associated pericolonic fat stranding and prominent but nonenlarged mesenteric lymph nodes. Findings suggestive of malignancy. Differential diagnosis of infection. Recommend colonoscopy after course of infection treatment to exclude malignancy. 2. Tiny hiatal hernia. 3. Nonobstructive punctate left nephrolithiasis.   02/24/2021 Procedure   Colonoscopy, Dr. Shila  Impression: - Rule out malignancy, polypoid lesion in the cecum. Biopsied. - Likely malignant partially obstructing tumor in the ascending colon. Biopsied. Tattooed. - Diverticulosis in the sigmoid colon. - Non-bleeding external and internal hemorrhoids.   02/24/2021 Initial Biopsy   Diagnosis 1. Cecum Biopsy, Mass - FRAGMENTS OF TUBULOVILLOUS ADENOMA. - NO HIGH GRADE DYSPLASIA OR INVASIVE CARCINOMA. 2. Ascending Colon Biopsy, Mass - ADENOCARCINOMA.   03/05/2021 Imaging   EXAM: CT CHEST WITH CONTRAST  IMPRESSION: No evidence of metastatic disease in the chest.   Heterogeneous, enlarged thyroid  with multiple nodules measuring up to 2.2 cm. Recommend thyroid  US  (ref: J Am Coll Radiol.  2015 Feb;12(2): 143-50).   Coronary artery disease.    Aortic Atherosclerosis (ICD10-I70.0).   04/05/2021 Cancer Staging   Staging form: Colon and Rectum, AJCC 8th Edition - Pathologic stage from 04/05/2021: Stage IIIB (pT3, pN1a, cM0) - Signed by Lanny Callander, MD on 04/23/2021 Total positive nodes: 1 Histologic grading system: 4 grade system Histologic grade (G): G2 Residual tumor (R): R0 - None   04/05/2021 Definitive Surgery   FINAL MICROSCOPIC DIAGNOSIS:   A. COLON, RIGHT, RESECTION:  - Invasive moderately differentiated adenocarcinoma.  - Separate tubulovillous adenoma.  - All surgical margins negative for dysplasia and malignancy.  - One of thirty-six lymph nodes involved by metastatic adenocarcinoma (1/36).  - See Oncology Table.   ADDENDUM:  Mismatch Repair Protein (I[facility])  SUMMARY INTERPRETATION: NORMAL    04/23/2021 Initial Diagnosis   Cancer of right colon [facility])      Discussed the use of AI scribe software for clinical note transcription with the patient, who gave verbal consent to proceed.  History of Present Illness Vickie Holloway is a 78 year old female with stage 3B colon cancer who presents for follow-up.  She was diagnosed with stage 3B colon cancer in 2022 and underwent chemotherapy. Her recent CT scan from last month was normal, and her blood work has been improving. She expresses concern about the reliability of blood tests for cancer recurrence and inquires about more sensitive tests for monitoring.  A colonoscopy in March or April revealed two precancerous polyps, which were removed. Her bowel habits are generally consistent with occasional variations in frequency. Hemorrhoids sometimes cause discomfort, but there is no pain, weight loss, or other concerning symptoms.     All other systems were reviewed with the patient and are negative.  MEDICAL HISTORY:  Past Medical History:  Diagnosis Date   Allergy    Anemia    Anxiety    Arthritis    Cataract    Colon cancer (HCC)    Depression    Hyperlipidemia     Hypertension    Insomnia     SURGICAL HISTORY: Past Surgical History:  Procedure Laterality Date   ABDOMINAL HYSTERECTOMY  03/22/1983   CATARACT EXTRACTION W/ INTRAOCULAR LENS IMPLANT Left    COLONOSCOPY     COSMETIC SURGERY  03/21/1997   face lift   DILATION AND CURETTAGE OF UTERUS     LAPAROSCOPIC RIGHT HEMI COLECTOMY Right 04/05/2021   Procedure: LAPAROSCOPIC RIGHT HEMI COLECTOMY;  Surgeon: Teresa Lonni HERO, MD;  Location: [facility];  Service: General;  Laterality: Right;   ORIF WRIST FRACTURE Left 01/01/2013   Procedure: OPEN REDUCTION INTERNAL FIXATION (ORIF)OF LEFT DISTAL RADIUS FRACTURE;  Surgeon: Lamar LULLA Leonor Mickey., MD;  Location: [facility];  Service: Orthopedics;  Laterality: Left;   REFRACTIVE SURGERY     TONSILLECTOMY      I have reviewed the social history and family history with the patient and they are unchanged from previous note.  ALLERGIES:  has no known allergies.  MEDICATIONS:  Current Outpatient Medications  Medication Sig Dispense Refill   Ascorbic Acid  (VITAMIN C) 1000 MG tablet Take 1,000 mg by mouth daily. (Patient not taking: Reported on 12/19/2022)     Calcium Carb-Cholecalciferol (CALCIUM 500 + D PO) Take 2 tablets by mouth daily.     cyclobenzaprine  (FLEXERIL ) 5 MG tablet Take 1 tablet (5 mg total) by mouth 2 (two) times daily as needed for muscle spasms. 20 tablet 0   lamoTRIgine  (LAMICTAL ) 200 MG tablet Take 200 mg  by mouth daily.     lidocaine  (LIDODERM ) 5 % Place 1 patch onto the skin daily. Remove & Discard patch within 12 hours or as directed by MD 15 patch 0   Magnesium 400 MG TABS Take 400 mg by mouth daily.     Melatonin 10 MG TABS Take 10 mg by mouth at bedtime as needed (sleep). (Patient not taking: Reported on 12/19/2022)     methylPREDNISolone  (MEDROL  DOSEPAK) 4 MG TBPK tablet Please follow instructions on Dosepak 21 each 0   Multiple Vitamins-Minerals (HAIR SKIN AND NAILS FORMULA) TABS Take 3 tablets by mouth daily. (Patient  not taking: Reported on 12/19/2022)     Multiple Vitamins-Minerals (MULTIVITAMIN ADULT) CHEW Chew 1 tablet by mouth daily.     olmesartan (BENICAR) 20 MG tablet Take 20 mg by mouth daily.     OVER THE COUNTER MEDICATION Vitamin D 3 one capsule daily. (Patient not taking: Reported on 12/19/2022)     oxyCODONE -acetaminophen  (PERCOCET/ROXICET) 5-325 MG tablet Take 1 tablet by mouth every 6 (six) hours as needed for severe pain (pain score 7-10). 15 tablet 0   phytonadione (VITAMIN K) 5 MG tablet Take 5 mg by mouth daily.     spironolactone (ALDACTONE) 25 MG tablet Take 25 mg by mouth daily.     Zinc 50 MG TABS Take 50 mg by mouth daily.     No current facility-administered medications for this visit.    PHYSICAL EXAMINATION: ECOG PERFORMANCE STATUS: 0 - Asymptomatic  Vitals:   01/16/24 1100 01/16/24 1124  BP: (!) 155/80 120/80  Pulse: 68   Resp: 17   Temp: 97.9 F (36.6 C)   SpO2: 100%    Wt Readings from Last 3 Encounters:  01/16/24 125 lb 8 oz (56.9 kg)  06/22/23 128 lb 3.2 oz (58.2 kg)  12/19/22 126 lb 6.4 oz (57.3 kg)     GENERAL:alert, no distress and comfortable SKIN: skin color, texture, turgor are normal, no rashes or significant lesions EYES: normal, Conjunctiva are pink and non-injected, sclera clear Musculoskeletal:no cyanosis of digits and no clubbing  NEURO: alert & oriented x 3 with fluent speech, no focal motor/sensory deficits  Physical Exam    LABORATORY DATA:  I have reviewed the data as listed    Latest Ref Rng & Units 01/08/2024   11:16 AM 06/22/2023   11:12 AM 12/19/2022   11:23 AM  CBC  WBC 4.0 - 10.5 K/uL 4.1  5.1  5.3   Hemoglobin 12.0 - 15.0 g/dL 86.1  87.2  87.1   Hematocrit 36.0 - 46.0 % 43.2  39.0  39.3   Platelets 150 - 400 K/uL 226  254  228         Latest Ref Rng & Units 01/08/2024   11:52 AM 06/22/2023   11:12 AM 12/19/2022   11:23 AM  CMP  Glucose 70 - 99 mg/dL 86  93  99   BUN 8 - 23 mg/dL 16  26  22    Creatinine 0.44 - 1.00 mg/dL  9.26  9.16  9.14   Sodium 135 - 145 mmol/L 140  139  141   Potassium 3.5 - 5.1 mmol/L 4.5  4.6  4.2   Chloride 98 - 111 mmol/L 106  104  106   CO2 22 - 32 mmol/L 30  31  28    Calcium 8.9 - 10.3 mg/dL 9.9  9.5  89.6   Total Protein 6.5 - 8.1 g/dL 7.2  6.7  7.0   Total  Bilirubin 0.0 - 1.2 mg/dL 0.6  0.5  0.6   Alkaline Phos 38 - 126 U/L 77  64  65   AST 15 - 41 U/L 26  23  21    ALT 0 - 44 U/L 14  20  14        RADIOGRAPHIC STUDIES: I have personally reviewed the radiological images as listed and agreed with the findings in the report. No results found.    No orders of the defined types were placed in this encounter.  All questions were answered. The patient knows to call the clinic with any problems, questions or concerns. No barriers to learning was detected. The total time spent in the appointment was 25 minutes, including review of chart and various tests results, discussions about plan of care and coordination of care plan     Vickie Mattock, MD 01/16/2024

## 2024-07-15 ENCOUNTER — Inpatient Hospital Stay

## 2024-07-15 ENCOUNTER — Inpatient Hospital Stay: Admitting: Nurse Practitioner
